# Patient Record
Sex: Female | Born: 1995 | Race: Black or African American | Hispanic: No | Marital: Single | Smoking: Never smoker
Health system: Southern US, Community
[De-identification: ages and names within clinical notes are randomized; demographics above are authoritative.]

## PROBLEM LIST (undated history)

## (undated) ENCOUNTER — Inpatient Hospital Stay (HOSPITAL_COMMUNITY): Payer: Self-pay

## (undated) ENCOUNTER — Ambulatory Visit

## (undated) DIAGNOSIS — U071 COVID-19: Secondary | ICD-10-CM

## (undated) DIAGNOSIS — E119 Type 2 diabetes mellitus without complications: Secondary | ICD-10-CM

## (undated) DIAGNOSIS — R7303 Prediabetes: Secondary | ICD-10-CM

## (undated) DIAGNOSIS — R87619 Unspecified abnormal cytological findings in specimens from cervix uteri: Secondary | ICD-10-CM

## (undated) DIAGNOSIS — A5901 Trichomonal vulvovaginitis: Secondary | ICD-10-CM

## (undated) DIAGNOSIS — F32A Depression, unspecified: Secondary | ICD-10-CM

## (undated) DIAGNOSIS — E301 Precocious puberty: Secondary | ICD-10-CM

## (undated) DIAGNOSIS — E669 Obesity, unspecified: Secondary | ICD-10-CM

## (undated) DIAGNOSIS — F419 Anxiety disorder, unspecified: Secondary | ICD-10-CM

## (undated) DIAGNOSIS — A6000 Herpesviral infection of urogenital system, unspecified: Secondary | ICD-10-CM

## (undated) DIAGNOSIS — F329 Major depressive disorder, single episode, unspecified: Secondary | ICD-10-CM

## (undated) DIAGNOSIS — A749 Chlamydial infection, unspecified: Secondary | ICD-10-CM

## (undated) DIAGNOSIS — Z973 Presence of spectacles and contact lenses: Secondary | ICD-10-CM

## (undated) HISTORY — DX: Precocious puberty: E30.1

## (undated) HISTORY — DX: Obesity, unspecified: E66.9

## (undated) HISTORY — DX: Unspecified abnormal cytological findings in specimens from cervix uteri: R87.619

## (undated) HISTORY — DX: Prediabetes: R73.03

---
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
## 2015-01-12 ENCOUNTER — Inpatient Hospital Stay (HOSPITAL_COMMUNITY)
Admission: AD | Admit: 2015-01-12 | Discharge: 2015-01-12 | Disposition: A | Discharge status: Home / Self Care | Payer: Managed Care, Other (non HMO) | Source: Ambulatory Visit | Attending: Family Medicine | Admitting: Family Medicine

## 2015-01-12 ENCOUNTER — Encounter (HOSPITAL_COMMUNITY): Payer: Self-pay | Admitting: *Deleted

## 2015-01-12 DIAGNOSIS — E119 Type 2 diabetes mellitus without complications: Secondary | ICD-10-CM | POA: Diagnosis not present

## 2015-01-12 DIAGNOSIS — R1033 Periumbilical pain: Secondary | ICD-10-CM

## 2015-01-12 DIAGNOSIS — R109 Unspecified abdominal pain: Secondary | ICD-10-CM | POA: Diagnosis present

## 2015-01-12 DIAGNOSIS — Z3202 Encounter for pregnancy test, result negative: Secondary | ICD-10-CM | POA: Diagnosis not present

## 2015-01-12 LAB — URINALYSIS, ROUTINE W REFLEX MICROSCOPIC
BILIRUBIN URINE: NEGATIVE
Glucose, UA: NEGATIVE mg/dL
Hgb urine dipstick: NEGATIVE
Ketones, ur: NEGATIVE mg/dL
Nitrite: NEGATIVE
PROTEIN: NEGATIVE mg/dL
Specific Gravity, Urine: 1.02 (ref 1.005–1.030)
Urobilinogen, UA: 0.2 mg/dL (ref 0.0–1.0)
pH: 7 (ref 5.0–8.0)

## 2015-01-12 LAB — CBC WITH DIFFERENTIAL/PLATELET
BASOS PCT: 0 % (ref 0–1)
Basophils Absolute: 0 10*3/uL (ref 0.0–0.1)
EOS PCT: 1 % (ref 0–5)
Eosinophils Absolute: 0.1 10*3/uL (ref 0.0–0.7)
HCT: 36 % (ref 36.0–46.0)
HEMOGLOBIN: 11.3 g/dL — AB (ref 12.0–15.0)
LYMPHS ABS: 2.7 10*3/uL (ref 0.7–4.0)
LYMPHS PCT: 34 % (ref 12–46)
MCH: 24.2 pg — ABNORMAL LOW (ref 26.0–34.0)
MCHC: 31.4 g/dL (ref 30.0–36.0)
MCV: 77.1 fL — ABNORMAL LOW (ref 78.0–100.0)
Monocytes Absolute: 0.6 10*3/uL (ref 0.1–1.0)
Monocytes Relative: 8 % (ref 3–12)
NEUTROS ABS: 4.6 10*3/uL (ref 1.7–7.7)
Neutrophils Relative %: 57 % (ref 43–77)
PLATELETS: 348 10*3/uL (ref 150–400)
RBC: 4.67 MIL/uL (ref 3.87–5.11)
RDW: 15.7 % — ABNORMAL HIGH (ref 11.5–15.5)
WBC: 8 10*3/uL (ref 4.0–10.5)

## 2015-01-12 LAB — URINE MICROSCOPIC-ADD ON

## 2015-01-12 LAB — POCT PREGNANCY, URINE: Preg Test, Ur: NEGATIVE

## 2015-01-12 MED ORDER — TRAMADOL HCL 50 MG PO TABS: 50.0000 mg | ORAL_TABLET | Freq: Four times a day (QID) | ORAL | Status: DC | PRN

## 2015-01-12 MED ORDER — PROMETHAZINE HCL 25 MG PO TABS: 25.0000 mg | ORAL_TABLET | Freq: Four times a day (QID) | ORAL | Status: DC | PRN

## 2015-01-12 NOTE — Discharge Instructions (Signed)
Abdominal Pain Many things can cause abdominal pain. Usually, abdominal pain is not caused by a disease and will improve without treatment. It can often be observed and treated at home. Your health care provider will do a physical exam and possibly order blood tests and X-rays to help determine the seriousness of your pain. However, in many cases, more time must pass before a clear cause of the pain can be found. Before that point, your health care provider may not know if you need more testing or further treatment. HOME CARE INSTRUCTIONS  Monitor your abdominal pain for any changes. The following actions may help to alleviate any discomfort you are experiencing:  Only take over-the-counter or prescription medicines as directed by your health care provider.  Do not take laxatives unless directed to do so by your health care provider.  Try a clear liquid diet (broth, tea, or water) as directed by your health care provider. Slowly move to a bland diet as tolerated. SEEK MEDICAL CARE IF:  You have unexplained abdominal pain.  You have abdominal pain associated with nausea or diarrhea.  You have pain when you urinate or have a bowel movement.  You experience abdominal pain that wakes you in the night.  You have abdominal pain that is worsened or improved by eating food.  You have abdominal pain that is worsened with eating fatty foods.  You have a fever. SEEK IMMEDIATE MEDICAL CARE IF:   Your pain does not go away within 2 hours.  You keep throwing up (vomiting).  Your pain is felt only in portions of the abdomen, such as the right side or the left lower portion of the abdomen.  You pass bloody or black tarry stools. MAKE SURE YOU:  Understand these instructions.   Will watch your condition.   Will get help right away if you are not doing well or get worse.  Document Released: 08/01/2005 Document Revised: 10/27/2013 Document Reviewed: 07/01/2013 [facility] Patient Information  2015 [employer] [place]. This information is not intended to replace advice given to you by your health care provider. Make sure you discuss any questions you have with your health care provider.  Abdominal Pain Many things can cause belly (abdominal) pain. Most times, the belly pain is not dangerous. Many cases of belly pain can be watched and treated at home. HOME CARE   Do not take medicines that help you go poop (laxatives) unless told to by your doctor.  Only take medicine as told by your doctor.  Eat or drink as told by your doctor. Your doctor will tell you if you should be on a special diet. GET HELP IF:  You do not know what is causing your belly pain.  You have belly pain while you are sick to your stomach (nauseous) or have runny poop (diarrhea).  You have pain while you pee or poop.  Your belly pain wakes you up at night.  You have belly pain that gets worse or better when you eat.  You have belly pain that gets worse when you eat fatty foods.  You have a fever. GET HELP RIGHT AWAY IF:   The pain does not go away within 2 hours.  You keep throwing up (vomiting).  The pain changes and is only in the right or left part of the belly.  You have bloody or tarry looking poop. MAKE SURE YOU:   Understand these instructions.  Will watch your condition.  Will get help right away if you are  not doing well or get worse. Document Released: 04/09/2008 Document Revised: 10/27/2013 Document Reviewed: 07/01/2013 [facility] Patient Information 2015 [place], [place]. This information is not intended to replace advice given to you by your health care provider. Make sure you discuss any questions you have with your health care provider.

## 2015-01-12 NOTE — MAU Note (Addendum)
For the past month, has been having constant sharp pain behind her belly button, gets worse when she does anything. No vomiting.  Has diarrhea every other day.

## 2015-01-12 NOTE — MAU Provider Note (Signed)
History     CSN: [id number]  Arrival date and time: 01/12/15 1556 [id number]  First Provider Initiated Contact with Patient 01/12/15 1700      No chief complaint on file.  HPI Comments: Jaime Ramos 19 y.o. [id number] presents to MAU with pains in her belly button. The pain started 3 weeks after her D&C on 11/09/14. The pain is worse when she eats and moves about. It comes and goes, when its bad it is a 10/10. She had a CT of abdomen and pelvis on 11/19/14 due to pain.      Past Medical History  Diagnosis Date  . Asthma   . Obesity   . Precocious puberty   . Prediabetes   . Goiter 07/31/2011  . Diabetes mellitus without complication   . Trichomonal vaginitis   . Depression     doing good    Past Surgical History  Procedure Laterality Date  . No past surgeries    . Dilation and curettage of uterus N/A 11/09/2014    Procedure: DILATATION AND CURETTAGE;  Surgeon: [name], MD;  Location: [facility];  Service: Gynecology;  Laterality: N/A;  . Dilation and curettage of uterus  2016    Family History  Problem Relation Age of Onset  . Diabetes Mother   . Hypertension Mother   . Obesity Mother   . Fibroids Mother   . Cancer Mother     thyroid  . Obesity Father   . Obesity Maternal Aunt   . Fibroids Maternal Aunt   . Obesity Maternal Uncle   . Obesity Paternal Aunt   . Diabetes Maternal Grandmother   . Obesity Maternal Grandmother   . Hypertension Maternal Grandmother   . Diabetes Maternal Grandfather   . Obesity Maternal Grandfather   . Stroke Maternal Grandfather   . Heart disease Maternal Grandfather   . Hypertension Maternal Grandfather   . Obesity Paternal Grandfather   . Heart disease Paternal Grandfather   . Bipolar disorder Brother   . Bipolar disorder Brother     History  Substance Use Topics  . Smoking status: Never Smoker   . Smokeless tobacco: Never Used  . Alcohol Use: No    Allergies: No Known Allergies  Prescriptions prior to admission  Medication Sig  Dispense Refill Last Dose  . etonogestrel (NEXPLANON) 68 MG IMPL implant 1 each by Subdermal route once.   Taking  . ibuprofen (ADVIL,MOTRIN) 600 MG tablet Take 1 tablet (600 mg total) by mouth every 8 (eight) hours as needed for fever or moderate pain. (Patient not taking: Reported on 01/12/2015) 30 tablet 1 more than one month    Review of Systems  Constitutional: Negative.   HENT: Negative.   Eyes: Negative.   Respiratory: Negative.   Cardiovascular: Negative.   Gastrointestinal: Positive for abdominal pain.  Genitourinary: Negative.   Musculoskeletal: Negative.   Skin: Negative.   Neurological: Negative.   Psychiatric/Behavioral: Negative.    Physical Exam   Blood pressure 121/66, pulse 94, temperature 99.4 F (37.4 C), temperature source Oral, resp. rate 18, height 5\' 5"  (1.651 m), weight 111.585 kg (246 lb).  Physical Exam  Constitutional: She is oriented to person, place, and time. She appears well-developed and well-nourished. No distress.  HENT:  Head: Normocephalic and atraumatic.  Eyes: Pupils are equal, round, and reactive to light.  Cardiovascular: Normal rate, regular rhythm and normal heart sounds.   Respiratory: Effort normal and breath sounds normal. No respiratory distress. She has no wheezes. She has  no rales.  GI: Soft. She exhibits no distension. There is tenderness. There is no rebound and no guarding.  No obvious hernia with cough  Musculoskeletal: Normal range of motion.  Neurological: She is alert and oriented to person, place, and time.  Skin: Skin is warm and dry.  Psychiatric: She has a normal mood and affect. Her behavior is normal. Judgment and thought content normal.   Results for orders placed or performed during the hospital encounter of 01/12/15 (from the past 24 hour(s))  Urinalysis, Routine w reflex microscopic     Status: Abnormal   Collection Time: 01/12/15  4:10 PM  Result Value Ref Range   Color, Urine YELLOW YELLOW   APPearance CLEAR  CLEAR   Specific Gravity, Urine 1.020 1.005 - 1.030   pH 7.0 5.0 - 8.0   Glucose, UA NEGATIVE NEGATIVE mg/dL   Hgb urine dipstick NEGATIVE NEGATIVE   Bilirubin Urine NEGATIVE NEGATIVE   Ketones, ur NEGATIVE NEGATIVE mg/dL   Protein, ur NEGATIVE NEGATIVE mg/dL   Urobilinogen, UA 0.2 0.0 - 1.0 mg/dL   Nitrite NEGATIVE NEGATIVE   Leukocytes, UA TRACE (A) NEGATIVE  Urine microscopic-add on     Status: Abnormal   Collection Time: 01/12/15  4:10 PM  Result Value Ref Range   Squamous Epithelial / LPF RARE RARE   WBC, UA 3-6 <3 WBC/hpf   Bacteria, UA FEW (A) RARE   Urine-Other MUCOUS PRESENT   Pregnancy, urine POC     Status: None   Collection Time: 01/12/15  4:17 PM  Result Value Ref Range   Preg Test, Ur NEGATIVE NEGATIVE  CBC with Differential     Status: Abnormal   Collection Time: 01/12/15  5:47 PM  Result Value Ref Range   WBC 8.0 4.0 - 10.5 K/uL   RBC 4.67 3.87 - 5.11 MIL/uL   Hemoglobin 11.3 (L) 12.0 - 15.0 g/dL   HCT 16.1 09.6 - 04.5 %   MCV 77.1 (L) 78.0 - 100.0 fL   MCH 24.2 (L) 26.0 - 34.0 pg   MCHC 31.4 30.0 - 36.0 g/dL   RDW 40.9 (H) 81.1 - 91.4 %   Platelets 348 150 - 400 K/uL   Neutrophils Relative % 57 43 - 77 %   Neutro Abs 4.6 1.7 - 7.7 K/uL   Lymphocytes Relative 34 12 - 46 %   Lymphs Abs 2.7 0.7 - 4.0 K/uL   Monocytes Relative 8 3 - 12 %   Monocytes Absolute 0.6 0.1 - 1.0 K/uL   Eosinophils Relative 1 0 - 5 %   Eosinophils Absolute 0.1 0.0 - 0.7 K/uL   Basophils Relative 0 0 - 1 %   Basophils Absolute 0.0 0.0 - 0.1 K/uL    MAU Course  Procedures  MDM CBC Spoke with Dr Despina Hidden who reviewed chart/ CT and advised ok to send home with pain meds She is a patient of [facility]  Assessment and Plan   A: Abdominal pain  P: Ultram 50 mg po q6 hours prn # 10/ no refills Phenergan 25 mg po q 8 hours prn Advised to follow up with [facility]/ MAU   Carolynn Serve 01/12/2015, 6:05 PM

## 2015-02-09 ENCOUNTER — Ambulatory Visit (INDEPENDENT_AMBULATORY_CARE_PROVIDER_SITE_OTHER): Payer: Managed Care, Other (non HMO) | Admitting: Advanced Practice Midwife

## 2015-02-09 ENCOUNTER — Encounter: Payer: Self-pay | Admitting: Advanced Practice Midwife

## 2015-02-09 VITALS — BP 112/58 | HR 88 | Ht 66.0 in | Wt 249.0 lb

## 2015-02-09 DIAGNOSIS — Z3202 Encounter for pregnancy test, result negative: Secondary | ICD-10-CM | POA: Diagnosis not present

## 2015-02-09 DIAGNOSIS — Z3049 Encounter for surveillance of other contraceptives: Secondary | ICD-10-CM

## 2015-02-09 DIAGNOSIS — Z3046 Encounter for surveillance of implantable subdermal contraceptive: Secondary | ICD-10-CM | POA: Insufficient documentation

## 2015-02-09 LAB — POCT URINE PREGNANCY: Preg Test, Ur: NEGATIVE

## 2015-02-09 MED ORDER — NORGESTIMATE-ETH ESTRADIOL 0.25-35 MG-MCG PO TABS: 1.0000 | ORAL_TABLET | Freq: Every day | ORAL | Status: DC

## 2015-02-09 NOTE — Progress Notes (Signed)
HPI:  Jaime Ramos 19 y.o. here for Nexplanon removal. She got it in January, and has "been sick" the whole time.  Belly button pain, nausea..   Her future plans for birth control are COC's.  Admittedly, she has a hard time remembering, but doesn't want anything else.  Past Medical History: Past Medical History  Diagnosis Date  . Asthma   . Obesity   . Precocious puberty   . Prediabetes   . Goiter 07/31/2011  . Diabetes mellitus without complication   . Trichomonal vaginitis   . Depression     doing good    Past Surgical History: Past Surgical History  Procedure Laterality Date  . No past surgeries    . Dilation and curettage of uterus N/A 11/09/2014    Procedure: DILATATION AND CURETTAGE;  Surgeon: [name], MD;  Location: [facility];  Service: Gynecology;  Laterality: N/A;  . Dilation and curettage of uterus  2016    Family History: Family History  Problem Relation Age of Onset  . Diabetes Mother   . Hypertension Mother   . Obesity Mother   . Fibroids Mother   . Cancer Mother     thyroid  . Obesity Father   . Obesity Maternal Aunt   . Fibroids Maternal Aunt   . Obesity Maternal Uncle   . Obesity Paternal Aunt   . Diabetes Maternal Grandmother   . Obesity Maternal Grandmother   . Hypertension Maternal Grandmother   . Diabetes Maternal Grandfather   . Obesity Maternal Grandfather   . Stroke Maternal Grandfather   . Heart disease Maternal Grandfather   . Hypertension Maternal Grandfather   . Obesity Paternal Grandfather   . Heart disease Paternal Grandfather   . Bipolar disorder Brother   . Bipolar disorder Brother     Social History: History  Substance Use Topics  . Smoking status: Never Smoker   . Smokeless tobacco: Never Used  . Alcohol Use: No    Allergies: No Known Allergies  Meds: Rx Sprintec.     Patient given informed consent for removal of her Nexplanon, time out was performed.  Signed copy in the chart.  Appropriate time out taken. Implanon  site identified.  Area prepped in usual sterile fashon. One cc of 1% lidocaine was used to anesthetize the area at the distal end of the implant. A small stab incision was made right beside the implant on the distal portion.  The Nexplanon rod was grasped using hemostats and removed without difficulty.  There was less than 3 cc blood loss. There were no complications.  A small amount of antibiotic ointment and steri-strips were applied over the small incision.  A pressure bandage was applied to reduce any bruising.  The patient tolerated the procedure well and was given post procedure instructions.  Start pills today or tomorrow.  Back up for 3 weeks

## 2015-03-24 ENCOUNTER — Emergency Department (HOSPITAL_COMMUNITY)
Admission: EM | Admit: 2015-03-24 | Discharge: 2015-03-24 | Disposition: A | Discharge status: Home / Self Care | Payer: Managed Care, Other (non HMO) | Attending: Emergency Medicine | Admitting: Emergency Medicine

## 2015-03-24 ENCOUNTER — Encounter (HOSPITAL_COMMUNITY): Payer: Self-pay | Admitting: *Deleted

## 2015-03-24 DIAGNOSIS — R200 Anesthesia of skin: Secondary | ICD-10-CM

## 2015-03-24 DIAGNOSIS — R202 Paresthesia of skin: Secondary | ICD-10-CM | POA: Insufficient documentation

## 2015-03-24 DIAGNOSIS — Z8619 Personal history of other infectious and parasitic diseases: Secondary | ICD-10-CM | POA: Diagnosis not present

## 2015-03-24 DIAGNOSIS — E669 Obesity, unspecified: Secondary | ICD-10-CM | POA: Diagnosis not present

## 2015-03-24 DIAGNOSIS — Z793 Long term (current) use of hormonal contraceptives: Secondary | ICD-10-CM | POA: Insufficient documentation

## 2015-03-24 DIAGNOSIS — E119 Type 2 diabetes mellitus without complications: Secondary | ICD-10-CM | POA: Diagnosis not present

## 2015-03-24 DIAGNOSIS — J45909 Unspecified asthma, uncomplicated: Secondary | ICD-10-CM | POA: Insufficient documentation

## 2015-03-24 DIAGNOSIS — Z8659 Personal history of other mental and behavioral disorders: Secondary | ICD-10-CM | POA: Diagnosis not present

## 2015-03-24 LAB — CBC WITH DIFFERENTIAL/PLATELET
BASOS PCT: 0 % (ref 0–1)
Basophils Absolute: 0 10*3/uL (ref 0.0–0.1)
Eosinophils Absolute: 0.1 10*3/uL (ref 0.0–0.7)
Eosinophils Relative: 1 % (ref 0–5)
HCT: 40.7 % (ref 36.0–46.0)
Hemoglobin: 12.5 g/dL (ref 12.0–15.0)
Lymphocytes Relative: 24 % (ref 12–46)
Lymphs Abs: 2.4 10*3/uL (ref 0.7–4.0)
MCH: 24 pg — ABNORMAL LOW (ref 26.0–34.0)
MCHC: 30.7 g/dL (ref 30.0–36.0)
MCV: 78.1 fL (ref 78.0–100.0)
Monocytes Absolute: 0.5 10*3/uL (ref 0.1–1.0)
Monocytes Relative: 5 % (ref 3–12)
NEUTROS ABS: 7.1 10*3/uL (ref 1.7–7.7)
NEUTROS PCT: 70 % (ref 43–77)
Platelets: 368 10*3/uL (ref 150–400)
RBC: 5.21 MIL/uL — ABNORMAL HIGH (ref 3.87–5.11)
RDW: 15.5 % (ref 11.5–15.5)
WBC: 10.2 10*3/uL (ref 4.0–10.5)

## 2015-03-24 LAB — BASIC METABOLIC PANEL
Anion gap: 9 (ref 5–15)
BUN: 9 mg/dL (ref 6–20)
CHLORIDE: 105 mmol/L (ref 101–111)
CO2: 24 mmol/L (ref 22–32)
Calcium: 8.9 mg/dL (ref 8.9–10.3)
Creatinine, Ser: 0.63 mg/dL (ref 0.44–1.00)
GFR calc Af Amer: >60 mL/min (ref >60)
GLUCOSE: 88 mg/dL (ref 65–99)
POTASSIUM: 3.7 mmol/L (ref 3.5–5.1)
Sodium: 138 mmol/L (ref 135–145)

## 2015-03-24 NOTE — ED Notes (Signed)
Pt in c/o numbness to inside of bilateral legs, denies injury, ambulatory to room without distress, pt denies pain, no history of same, states this started 2 days ago

## 2015-03-24 NOTE — ED Notes (Signed)
Patient transported to X-ray 

## 2015-03-24 NOTE — Discharge Instructions (Signed)
Neuropathic Pain We often think that pain has a physical cause. If we get rid of the cause, the pain should go away. Nerves themselves can also cause pain. It is called neuropathic pain, which means nerve abnormality. It may be difficult for the patients who have it and for the treating caregivers. Pain is usually described as acute (short-lived) or chronic (long-lasting). Acute pain is related to the physical sensations caused by an injury. It can last from a few seconds to many weeks, but it usually goes away when normal healing occurs. Chronic pain lasts beyond the typical healing time. With neuropathic pain, the nerve fibers themselves may be damaged or injured. They then send incorrect signals to other pain centers. The pain you feel is real, but the cause is not easy to find.  CAUSES  Chronic pain can result from diseases, such as diabetes and shingles (an infection related to chickenpox), or from trauma, surgery, or amputation. It can also happen without any known injury or disease. The nerves are sending pain messages, even though there is no identifiable cause for such messages.   Other common causes of neuropathy include diabetes, phantom limb pain, or Regional Pain Syndrome (RPS).  As with all forms of chronic back pain, if neuropathy is not correctly treated, there can be a number of associated problems that lead to a downward cycle for the patient. These include depression, sleeplessness, feelings of fear and anxiety, limited social interaction and inability to do normal daily activities or work.  The most dramatic and mysterious example of neuropathic pain is called "phantom limb syndrome." This occurs when an arm or a leg has been removed because of illness or injury. The brain still gets pain messages from the nerves that originally carried impulses from the missing limb. These nerves now seem to misfire and cause troubling pain.  Neuropathic pain often seems to have no cause. It responds  poorly to standard pain treatment. Neuropathic pain can occur after:  Shingles (herpes zoster virus infection).  A lasting burning sensation of the skin, caused usually by injury to a peripheral nerve.  Peripheral neuropathy which is widespread nerve damage, often caused by diabetes or alcoholism.  Phantom limb pain following an amputation.  Facial nerve problems (trigeminal neuralgia).  Multiple sclerosis.  Reflex sympathetic dystrophy.  Pain which comes with cancer and cancer chemotherapy.  Entrapment neuropathy such as when pressure is put on a nerve such as in carpal tunnel syndrome.  Back, leg, and hip problems (sciatica).  Spine or back surgery.  HIV Infection or AIDS where nerves are infected by viruses. Your caregiver can explain items in the above list which may apply to you. SYMPTOMS  Characteristics of neuropathic pain are:  Severe, sharp, electric shock-like, shooting, lightening-like, knife-like.  Pins and needles sensation.  Deep burning, deep cold, or deep ache.  Persistent numbness, tingling, or weakness.  Pain resulting from light touch or other stimulus that would not usually cause pain.  Increased sensitivity to something that would normally cause pain, such as a pinprick. Pain may persist for months or years following the healing of damaged tissues. When this happens, pain signals no longer sound an alarm about current injuries or injuries about to happen. Instead, the alarm system itself is not working correctly.  Neuropathic pain may get worse instead of better over time. For some people, it can lead to serious disability. It is important to be aware that severe injury in a limb can occur without a proper, protective pain  response. Burns, cuts, and other injuries may go unnoticed. Without proper treatment, these injuries can become infected or lead to further disability. Take any injury seriously, and consult your caregiver for treatment. °DIAGNOSIS    °When you have a pain with no known cause, your caregiver will probably ask some specific questions:  °· Do you have any other conditions, such as diabetes, shingles, multiple sclerosis, or HIV infection? °· How would you describe your pain? (Neuropathic pain is often described as shooting, stabbing, burning, or searing.) °· Is your pain worse at any time of the day? (Neuropathic pain is usually worse at night.) °· Does the pain seem to follow a certain physical pathway? °· Does the pain come from an area that has missing or injured nerves? (An example would be phantom limb pain.) °· Is the pain triggered by minor things such as rubbing against the sheets at night? °These questions often help define the type of pain involved. Once your caregiver knows what is happening, treatment can begin. Anticonvulsant, antidepressant drugs, and various pain relievers seem to work in some cases. If another condition, such as diabetes is involved, better management of that disorder may relieve the neuropathic pain.  °TREATMENT  °Neuropathic pain is frequently long-lasting and tends not to respond to treatment with narcotic type pain medication. It may respond well to other drugs such as antiseizure and antidepressant medications. Usually, neuropathic problems do not completely go away, but partial improvement is often possible with proper treatment. Your caregivers have large numbers of medications available to treat you. Do not be discouraged if you do not get immediate relief. Sometimes different medications or a combination of medications will be tried before you receive the results you are hoping for. See your caregiver if you have pain that seems to be coming from nowhere and does not go away. Help is available.  °SEEK IMMEDIATE MEDICAL CARE IF:  °· There is a sudden change in the quality of your pain, especially if the change is on only one side of the body. °· You notice changes of the skin, such as redness, black or  purple discoloration, swelling, or an ulcer. °· You cannot move the affected limbs. °Document Released: 07/19/2004 Document Revised: 01/14/2012 Document Reviewed: 07/19/2004 °ExitCare® Patient Information ©2015 [employer]. This information is not intended to replace advice given to you by your health care provider. Make sure you discuss any questions you have with your health care provider. ° ° °Emergency Department Resource Guide °1) Find a Doctor and Pay Out of Pocket °Although you won't have to find out who is covered by your insurance plan, it is a good idea to ask around and get recommendations. You will then need to call the office and see if the doctor you have chosen will accept you as a new patient and what types of options they offer for patients who are self-pay. Some doctors offer discounts or will set up payment plans for their patients who do not have insurance, but you will need to ask so you aren't surprised when you get to your appointment. ° °2) Contact Your Local Health Department °Not all health departments have doctors that can see patients for sick visits, but many do, so it is worth a call to see if yours does. If you don't know where your local health department is, you can check in your phone book. The CDC also has a tool to help you locate your state's health department, and many state websites also have listings   all of their local health departments.  3) Find a Walk-in Clinic If your illness is not likely to be very severe or complicated, you may want to try a walk in clinic. These are popping up all over the country in pharmacies, drugstores, and shopping centers. They're usually staffed by nurse practitioners or physician assistants that have been trained to treat common illnesses and complaints. They're usually fairly quick and inexpensive. However, if you have serious medical issues or chronic medical problems, these are probably not your best option.  No Primary Care  Doctor: - Call Health Connect at  [phone] - they can help you locate a primary care doctor that  accepts your insurance, provides certain services, etc. - Physician Referral Service- [phone]  Chronic Pain Problems: Organization         Address  Phone   Notes  [facility]  [phone] Patients need to be referred by their primary care doctor.   Medication Assistance: Organization         Address  Phone   Notes  [facility] Medication [facility] [address] [place], [place] [address] [phone] --Must be a resident of [facility] -- Must have NO insurance coverage whatsoever (no Medicaid/ Medicare, etc.) -- The pt. MUST have a primary care doctor that directs their care regularly and follows them in the community   MedAssist  [phone]   [employer]  [phone]    Agencies that provide inexpensive medical care: Organization         Address  Phone   Notes  [facility]  [phone]   [facility]    [phone]) [phone]   [facility] [address] [place], [place] [address] [phone]   [facility] [address], [place] [phone]   Planned Parenthood    [phone]   [facility]    [phone]   [facility]  [address], [place] Phone:  [phone], Fax:  [phone] Hours of Operation:  9 am - 6 pm, M-F.  Also accepts Medicaid/Medicare and self-pay.  [facility]  [address], [place] Phone: [phone], Fax: [phone]. Hours of Operation:  8:30 am - 5:30 pm, M-F.  Also accepts Medicaid and self-pay.  [facility] [place] [address], [place] Phone: [phone]   [facility] [address], [place], [place] [phone], Ext. [phone] Mondays & Thursdays: 7-9 AM.  First 15 patients are seen on a first  come, first serve basis.    Medicaid-accepting [facility] Providers:  Organization         Address  Phone   Notes  [facility] [address], [place] [phone] Also accepts self-pay patients.  [facility] [address], [place]  [phone]) [phone]   [facility] [address], [place] [phone]   [facility] [address] [place] [address], [place] [phone]) [phone]   Renaye RakersVeita Bland [address], [place]   [phone] Only accepts [place] Access [place] patients after they have their name applied to their card.   Self-Pay (no insurance) in [facility]:  Retail buyerrganization         Address  Phone   Notes  Sickle Cell Patients, [facility] [address], [place] [phone]   [facility] [address], [place] [phone]   [facility] [place]  [address], [place] [phone]   [facility]/Dr. Osei-Bonsu  [address] [place] [address], [place] or [id number], [place] [phone]) [phone] Phone number for both [place] and [place] locations is the same.  [facility] [address], [place] [phone]   [facility] [address] [place] [address], [place] or [address] [phone]   [facility] [address], [place] [phone], phone; [phone], fax Sees patients 1st and 3rd Saturday of every month.  Must not qualify for public or private insurance (i.e. Medicaid, Medicare, [place] Health Choice, Veterans' Benefits)  Household income should be no more than 200% of the poverty level The clinic cannot treat you if you are pregnant or think you are pregnant  Sexually transmitted diseases are not treated at the clinic.    Dental Care: Organization          Address  Phone  Notes  [facility] Department of [facility] [address], [place] [phone] Accepts children up to age 19 who are enrolled in [place]icaid or [place] Health Choice; pregnant women with a Medicaid card; and children who have applied for Medicaid or [place] Health Choice, but were declined, whose parents can pay a reduced fee at time of service.  [facility] of [facility] [place]  [address], [place] [phone] Accepts children up to age 19 who are enrolled in [place]icaid or [place] Health Choice; pregnant women with a Medicaid card; and children who have applied for Medicaid or [place] Health Choice, but were declined, whose parents can pay a reduced fee at time of service.  [facility]  [address], [place] [phone] Patients are seen by appointment only. Walk-ins are not accepted. [facility] will see patients 19 years of age and older. Monday - Tuesday (8am-5pm) Most Wednesdays (8:30-5pm) $30 per visit, cash only  [facility]  [address], [facility] [phone] Patients are seen by appointment only. Walk-ins are not accepted. [facility] will see patients 19 years of age and older. One Wednesday Evening (Monthly: Volunteer Based).  $30 per visit, cash only  [employer] [facility]  [phone] for adults; Children under age 584, call Graduate Pediatric Dentistry at [phone]. Children aged 644-14, please call [phone] to request a pediatric application.  Dental services are provided in all areas of dental care including fillings, crowns and bridges, complete and partial dentures, implants, gum treatment, root canals, and extractions. Preventive care is also provided. Treatment is provided to both adults and children. Patients are selected via a lottery and there is often a waiting list.   [facility] [address], [place]  [phone] [email or web address]   [facility] [address], [place], [place] [phone], Ext. [phone] Second and Fourth Thursday of each month, opens at 6:30 AM; Clinic ends at 9 AM.  Patients are seen on a first-come first-served basis, and a limited number are seen during each clinic.   [facility]  [address], [place], [place] [phone]   Eligibility  Requirements You must have lived in [place], or [place] for at least the last three months.   You cannot be eligible for state or federal sponsored National Cityhealthcare insurance, including CIGNAVeterans Administration, [place]icaid, or [employer] EntertainmentMedicare.   You generally cannot be eligible for healthcare insurance through your employer.    How to apply: Eligibility screenings are held every Tuesday and Wednesday afternoon from 1:00 pm until 4:00 pm. You do not need an appointment for the interview!  [address] [facility] [address], [place], [place] [phone]   [facility]  [phone]   [facility]  [phone]   [facility]  [phone]    Behavioral Health Resources in the Community: Intensive Outpatient Programs Organization         Address  Phone  Notes  [facility] [address], [place], [place] [phone]   [facility] [address], [place], [place] [phone]   [facility] [address], [place], [place]  [phone]   [facility] [address],  [place], [place] [phone] or [phone]   Substance Abuse Resources Organization         Address  Phone  Notes  [facility]  [phone]   [facility]  [phone]   [facility]  [phone]   [facility]  [phone]   Residential & Outpatient Substance Abuse Program  [phone]   Psychological  Services Organization         Address  Phone  Notes  [facility]  [phone]   [facility]  [phone]- [phone]   [facility] [address], [place] [phone] or [phone]    Mobile Crisis Teams Organization         Address  Phone  Notes  [facility], Mobile Crisis Care Unit  [phone]   [facility]  [address] [place], [place] [phone]   Doristine LocksSharon DeEsch [address] [place] [phone]    Self-Help/Support Groups Organization         Address  Phone             Notes  Mental Health Assoc. of [place] - variety of support groups  [phone]- [phone] Call for more information  Narcotics Anonymous (NA), [facility] [address], [place]  2 meetings at this location   Statisticianesidential Treatment Programs Organization         Address  Phone  Notes  [facility] [address],    [place]  [phone]   [facility]  [address], [place] [phone], [place], [place] [phone]   [facility] [address], [place] [phone] Admissions: 8am-3pm M-F  [facility] [address]    [place], [place] [phone]   [facility] [address], [place], [place] [phone]   [facility] [address]  [place], [place] [phone]   [facility] - Intensive Outpatient [address], [place], [place] [phone]   [facility] [address]  [place], [place] [phone] or [phone]   [facility] (RTS) [address] [place], [place] [phone] Accepts Medicaid  [facility] [address]  [place] [phone] Substance Abuse/Addiction Treatment   [facility] Behavioral Health Resources Organization  Address  Phone  Notes  [facility]  [phone]   Domenic Schwab, PhD [address] [place], [place]   [phone] or [phone]   [place] [place], [place] [phone]   [address], [place], [place] [phone] Insurance/Medicaid/sponsorship through [facility] and Families [address] [place]                                    [place], [place] [phone] [place] [address], [place] [phone]    Dr. Adele Schilder  [phone]   [facility]. 1) [address], [place] 2) [place] 3)  [address], [place] [phone]  [phone]   [place] [phone] or [phone] (After Hours)

## 2015-03-24 NOTE — ED Provider Notes (Signed)
CSN: [id number]     Arrival date & time 03/24/15  1655 History   First MD Initiated Contact with Patient 03/24/15 1801     Chief Complaint  Patient presents with  . Numbness     (Consider location/radiation/quality/duration/timing/severity/associated sxs/prior Treatment) The history is provided by the patient. No language interpreter was used.  [name] is a 19 y.o female with a history of asthma, obesity, DI, Trichomonas, and depression who presents with numbness throughout her upper and lower bilateral extremities that began 2 days ago.  She has had intermittent episodes of this since with the latest one being 2 hours ago while in the lobby. Nothing makes the symptoms better or worse. She has had no prior treatment. She states she has diabetes and was diagnosed when she was 9 but has not taken any medications for this for years.  She does not have a pcp. She denies any fever, chills, headaches, chest pain, shortness of breath, abdominal pain, nausea, vomiting, diarrhea, dysuria, hematuria, or or urinary frequency.   Past Medical History  Diagnosis Date  . Asthma   . Obesity   . Precocious puberty   . Prediabetes   . Goiter 07/31/2011  . Diabetes mellitus without complication   . Trichomonal vaginitis   . Depression     doing good   Past Surgical History  Procedure Laterality Date  . No past surgeries    . Dilation and curettage of uterus N/A 11/09/2014    Procedure: DILATATION AND CURETTAGE;  Surgeon: [name], MD;  Location: [facility];  Service: Gynecology;  Laterality: N/A;  . Dilation and curettage of uterus  2016   Family History  Problem Relation Age of Onset  . Diabetes Mother   . Hypertension Mother   . Obesity Mother   . Fibroids Mother   . Cancer Mother     thyroid  . Obesity Father   . Obesity Maternal Aunt   . Fibroids Maternal Aunt   . Obesity Maternal Uncle   . Obesity Paternal Aunt   . Diabetes Maternal Grandmother   . Obesity Maternal Grandmother   .  Hypertension Maternal Grandmother   . Diabetes Maternal Grandfather   . Obesity Maternal Grandfather   . Stroke Maternal Grandfather   . Heart disease Maternal Grandfather   . Hypertension Maternal Grandfather   . Obesity Paternal Grandfather   . Heart disease Paternal Grandfather   . Bipolar disorder Brother   . Bipolar disorder Brother    History  Substance Use Topics  . Smoking status: Never Smoker   . Smokeless tobacco: Never Used  . Alcohol Use: No   OB History    Gravida Para Term Preterm AB TAB SAB Ectopic Multiple Living   2 1  1 1  1    0     Review of Systems  HENT: Negative for sore throat.   Respiratory: Negative for cough.   Cardiovascular: Negative for leg swelling.  Neurological: Positive for numbness. Negative for syncope and weakness.      Allergies  Review of patient's allergies indicates no known allergies.  Home Medications   Prior to Admission medications   Medication Sig Start Date End Date Taking? Authorizing Provider  ibuprofen (ADVIL,MOTRIN) 200 MG tablet Take 400 mg by mouth every 6 (six) hours as needed for moderate pain (cramping).   Yes Historical Provider, MD  norgestimate-ethinyl estradiol (ORTHO-CYCLEN,SPRINTEC,PREVIFEM) 0.25-35 MG-MCG tablet Take 1 tablet by mouth daily. Patient not taking: Reported on 03/24/2015 02/09/15  Scarlette CalicoFrances Cresenzo-Dishmon, CNM   BP 122/48 mmHg  Pulse 74  Temp(Src) 98.7 F (37.1 C) (Oral)  Resp 18  SpO2 99%  LMP 03/13/2015 Physical Exam  Constitutional: She is oriented to person, place, and time. She appears well-developed and well-nourished.  HENT:  Head: Normocephalic and atraumatic.  Eyes: Conjunctivae are normal.  Neck: Normal range of motion. Neck supple.  Cardiovascular: Normal rate, regular rhythm and normal heart sounds.   Pulmonary/Chest: Effort normal and breath sounds normal.  Abdominal: Soft. There is no tenderness.  Musculoskeletal: Normal range of motion.  Neurological: She is alert and  oriented to person, place, and time. She has normal strength. No sensory deficit. GCS eye subscore is 4. GCS verbal subscore is 5. GCS motor subscore is 6.  2 point discrimination exam was normal along bilateral upper and lower extremities. Cranial nerves III-XII in tact.   Skin: Skin is warm and dry.  Nursing note and vitals reviewed.   ED Course  Procedures (including critical care time) Labs Review Labs Reviewed  CBC WITH DIFFERENTIAL/PLATELET - Abnormal; Notable for the following:    RBC 5.21 (*)    MCH 24.0 (*)    All other components within normal limits  BASIC METABOLIC PANEL    Imaging Review No results found.   EKG Interpretation None      MDM   Final diagnoses:  Numbness and tingling  Patient with history of diabetes presents for numbness in bilateral upper and lower extremities.  2 point discrimination test is normal.  No syncope. She has no pain or other complaints.  She also denies back or neck pain. She is ambulating in the room without difficulty.  Her labs were unremarkable.  I do not know what is causing her symptoms.  She has no neurological deficit.  I do not think imaging is necessary.    I have given her a resource guide to find a provider and follow up.     Catha GosselinHanna Patel-Mills, PA-C 03/25/15 1237  Richardean Canalavid H Yao, MD 03/26/15 [phone]

## 2015-04-20 ENCOUNTER — Encounter: Payer: Self-pay | Admitting: Advanced Practice Midwife

## 2015-04-20 ENCOUNTER — Ambulatory Visit (INDEPENDENT_AMBULATORY_CARE_PROVIDER_SITE_OTHER): Payer: Managed Care, Other (non HMO) | Admitting: Advanced Practice Midwife

## 2015-04-20 VITALS — BP 100/60 | HR 80 | Ht 66.0 in | Wt 251.0 lb

## 2015-04-20 DIAGNOSIS — Z3202 Encounter for pregnancy test, result negative: Secondary | ICD-10-CM

## 2015-04-20 DIAGNOSIS — Z308 Encounter for other contraceptive management: Secondary | ICD-10-CM

## 2015-04-20 DIAGNOSIS — R1030 Lower abdominal pain, unspecified: Secondary | ICD-10-CM | POA: Diagnosis not present

## 2015-04-20 LAB — POCT URINALYSIS DIPSTICK
Blood, UA: NEGATIVE
Glucose, UA: NEGATIVE
Ketones, UA: NEGATIVE
Leukocytes, UA: NEGATIVE
Nitrite, UA: NEGATIVE
PROTEIN UA: NEGATIVE

## 2015-04-20 LAB — POCT URINE PREGNANCY: PREG TEST UR: NEGATIVE

## 2015-04-20 MED ORDER — NAPROXEN 500 MG PO TABS: 500.0000 mg | ORAL_TABLET | Freq: Two times a day (BID) | ORAL | Status: DC

## 2015-04-20 MED ORDER — CIPROFLOXACIN HCL 500 MG PO TABS: 500.0000 mg | ORAL_TABLET | Freq: Two times a day (BID) | ORAL | Status: DC

## 2015-04-20 MED ORDER — KETOROLAC TROMETHAMINE 10 MG PO TABS
10.0000 mg | ORAL_TABLET | Freq: Four times a day (QID) | ORAL | Status: DC | PRN
Start: 2015-04-20 — End: 2015-08-23

## 2015-04-20 NOTE — Patient Instructions (Signed)
Pelvic Pain Female pelvic pain can be caused by many different things and start from a variety of places. Pelvic pain refers to pain that is located in the lower half of the abdomen and between your hips. The pain may occur over a short period of time (acute) or may be reoccurring (chronic). The cause of pelvic pain may be related to disorders affecting the female reproductive organs (gynecologic), but it may also be related to the bladder, kidney stones, an intestinal complication, or muscle or skeletal problems. Getting help right away for pelvic pain is important, especially if there has been severe, sharp, or a sudden onset of unusual pain. It is also important to get help right away because some types of pelvic pain can be life threatening.  CAUSES  Below are only some of the causes of pelvic pain. The causes of pelvic pain can be in one of several categories.   Gynecologic.  Pelvic inflammatory disease.  Sexually transmitted infection.  Ovarian cyst or a twisted ovarian ligament (ovarian torsion).  Uterine lining that grows outside the uterus (endometriosis).  Fibroids, cysts, or tumors.  Ovulation.  Pregnancy.  Pregnancy that occurs outside the uterus (ectopic pregnancy).  Miscarriage.  Labor.  Abruption of the placenta or ruptured uterus.  Infection.  Uterine infection (endometritis).  Bladder infection.  Diverticulitis.  Miscarriage related to a uterine infection (septic abortion).  Bladder.  Inflammation of the bladder (cystitis).  Kidney stone(s).  Gastrointestinal.  Constipation.  Diverticulitis.  Neurologic.  Trauma.  Feeling pelvic pain because of mental or emotional causes (psychosomatic).  Cancers of the bowel or pelvis. EVALUATION  Your caregiver will want to take a careful history of your concerns. This includes recent changes in your health, a careful gynecologic history of your periods (menses), and a sexual history. Obtaining your family  history and medical history is also important. Your caregiver may suggest a pelvic exam. A pelvic exam will help identify the location and severity of the pain. It also helps in the evaluation of which organ system may be involved. In order to identify the cause of the pelvic pain and be properly treated, your caregiver may order tests. These tests may include:   A pregnancy test.  Pelvic ultrasonography.  An X-ray exam of the abdomen.  A urinalysis or evaluation of vaginal discharge.  Blood tests. HOME CARE INSTRUCTIONS   Only take over-the-counter or prescription medicines for pain, discomfort, or fever as directed by your caregiver.   Rest as directed by your caregiver.   Eat a balanced diet.   Drink enough fluids to make your urine clear or pale yellow, or as directed.   Avoid sexual intercourse if it causes pain.   Apply warm or cold compresses to the lower abdomen depending on which one helps the pain.   Avoid stressful situations.   Keep a journal of your pelvic pain. Write down when it started, where the pain is located, and if there are things that seem to be associated with the pain, such as food or your menstrual cycle.  Follow up with your caregiver as directed.  SEEK MEDICAL CARE IF:  Your medicine does not help your pain.  You have abnormal vaginal discharge. SEEK IMMEDIATE MEDICAL CARE IF:   You have heavy bleeding from the vagina.   Your pelvic pain increases.   You feel light-headed or faint.   You have chills.   You have pain with urination or blood in your urine.   You have uncontrolled diarrhea   or vomiting.   You have a fever or persistent symptoms for more than 3 days.  You have a fever and your symptoms suddenly get worse.   You are being physically or sexually abused.  MAKE SURE YOU:  Understand these instructions.  Will watch your condition.  Will get help if you are not doing well or get worse. Document Released:  09/18/2004 Document Revised: 03/08/2014 Document Reviewed: 02/11/2012 Exit[employer] Patient Information 2015 [employer]. This information is not intended to replace advice given to you by your health care provider. Make sure you discuss any questions you have with your health care provider.  

## 2015-04-20 NOTE — Addendum Note (Signed)
Addended by: Richardson Chiquito on: 04/20/2015 03:05 PM   Modules accepted: Orders

## 2015-04-20 NOTE — Progress Notes (Signed)
[facility] Visit  Patient name: Jaime Ramos MRN [id number]  Date of birth: 17-Feb-1996  CC & HPI:  Jaime Ramos is a 19 y.o. African American female presenting today for C/O pelvic pain.  She had her Nexplanon removed 2 months ago because she was having periumbilical pain that radiated to LLQ for several months.  She had a normal abdominal CT in Jan  2016, but doesn't think she was having this pain then. , She was started on COC's, but is not taking them (states is not in the mood for sex).  She says the pain went away and now has been back for the past week or so.  It hurts worse when she empties her bladder or during a BM, not affected by eating, moving.  States has IBS, but has never seen a GI doctor.    Pertinent History Reviewed:  Medical & Surgical Hx:   Past Medical History  Diagnosis Date  . Asthma   . Obesity   . Precocious puberty   . Prediabetes   . Goiter 07/31/2011  . Diabetes mellitus without complication   . Trichomonal vaginitis   . Depression     doing good   Past Surgical History  Procedure Laterality Date  . No past surgeries    . Dilation and curettage of uterus N/A 11/09/2014    Procedure: DILATATION AND CURETTAGE;  Surgeon: Allie Bossier, MD;  Location: [facility];  Service: Gynecology;  Laterality: N/A;  . Dilation and curettage of uterus  2016   Family History  Problem Relation Age of Onset  . Diabetes Mother   . Hypertension Mother   . Obesity Mother   . Fibroids Mother   . Cancer Mother     thyroid  . Obesity Father   . Obesity Maternal Aunt   . Fibroids Maternal Aunt   . Obesity Maternal Uncle   . Obesity Paternal Aunt   . Diabetes Maternal Grandmother   . Obesity Maternal Grandmother   . Hypertension Maternal Grandmother   . Diabetes Maternal Grandfather   . Obesity Maternal Grandfather   . Stroke Maternal Grandfather   . Heart disease Maternal Grandfather   . Hypertension Maternal Grandfather   . Obesity Paternal Grandfather   .  Heart disease Paternal Grandfather   . Bipolar disorder Brother   . Bipolar disorder Brother     Current outpatient prescriptions:  .  ciprofloxacin (CIPRO) 500 MG tablet, Take 1 tablet (500 mg total) by mouth 2 (two) times daily., Disp: 14 tablet, Rfl: 0 .  ibuprofen (ADVIL,MOTRIN) 200 MG tablet, Take 400 mg by mouth every 6 (six) hours as needed for moderate pain (cramping)., Disp: , Rfl:  .  ketorolac (TORADOL) 10 MG tablet, Take 1 tablet (10 mg total) by mouth every 6 (six) hours as needed., Disp: 20 tablet, Rfl: 0 .  norgestimate-ethinyl estradiol (ORTHO-CYCLEN,SPRINTEC,PREVIFEM) 0.25-35 MG-MCG tablet, Take 1 tablet by mouth daily. (Patient not taking: Reported on 03/24/2015), Disp: 1 Package, Rfl: 11 Social History: Reviewed -  reports that she has never smoked. She has never used smokeless tobacco.  Review of Systems:   Constitutional: Negative for fever and chills Eyes: Negative for visual disturbances Respiratory: Negative for shortness of breath, dyspnea Cardiovascular: Negative for chest pain or palpitations  Gastrointestinal: Negative for vomiting, diarrhea and constipation; no abdominal pain Genitourinary: Negative for dysuria and urgency, vaginal irritation or itching Musculoskeletal: Negative for back pain, joint pain, myalgias  Neurological: Negative for dizziness and headaches  Objective Findings:  Vitals: BP 100/60 mmHg  Pulse 80  Ht  (1.676 m)  Wt 251 lb (113.853 kg)  BMI 40.53 kg/m2  LMP 04/14/2015  Physical Examination: General appearance - alert, well appearing, and in no distress Mental status - alert, oriented to person, place, and time Abdomen - tender LLQ, no rebound or guarding.  Not tender in umbilical area, but feels that pain radiates in that direction Pelvic - SSE:  Thin white dc, no odor.  Wet prep + WBC, no trich, clue, or yeast.  Not tender during bimanual except on the abdominal hand  Results for orders placed or performed in visit on  04/20/15 (from the past 24 hour(s))  POCT urine pregnancy   Collection Time: 04/20/15 10:41 AM  Result Value Ref Range   Preg Test, Ur Negative Negative  POCT urinalysis dipstick   Collection Time: 04/20/15 10:42 AM  Result Value Ref Range   Color, UA     Clarity, UA     Glucose, UA neg    Bilirubin, UA     Ketones, UA neg    Spec Grav, UA     Blood, UA neg    pH, UA     Protein, UA neg    Urobilinogen, UA     Nitrite, UA neg    Leukocytes, UA Negative Negative          Assessment & Plan:  A:   LLQ pain, non acute:  ? Ovarian cyst  ? Intestinal ? infection P:  Tx leukorrhea with Cipro  BID X 7: if no improvement, pelvic US   CRESENZO-DISHMAN,Coulton Schlink CNM 04/20/2015 10:51 AM

## 2015-04-22 LAB — GC/CHLAMYDIA PROBE AMP
CHLAMYDIA, DNA PROBE: POSITIVE — AB
Neisseria gonorrhoeae by PCR: NEGATIVE

## 2015-04-25 ENCOUNTER — Telehealth: Payer: Self-pay | Admitting: Advanced Practice Midwife

## 2015-04-25 MED ORDER — AZITHROMYCIN 500 MG PO TABS: ORAL_TABLET | ORAL | Status: DC

## 2015-04-25 NOTE — Telephone Encounter (Signed)
Pt aware of +Chalmdia, will rx azithromycin 500 mg #2 take 2 po now partner to clinic, return in 4 weeks for POT , N[facility]DRC sent.

## 2015-04-27 ENCOUNTER — Encounter: Payer: Self-pay | Admitting: Advanced Practice Midwife

## 2015-04-27 DIAGNOSIS — A749 Chlamydial infection, unspecified: Secondary | ICD-10-CM | POA: Insufficient documentation

## 2015-04-28 ENCOUNTER — Telehealth: Payer: Self-pay | Admitting: *Deleted

## 2015-04-28 NOTE — Telephone Encounter (Signed)
[name], [facility], states when did pt get diagnosed with Chlamydia and when was treated. Informed positive Chlamydia on 04/20/2015, treat with Azithromycin 500 mg tablet on 04/25/2015.

## 2015-05-25 ENCOUNTER — Ambulatory Visit: Payer: Managed Care, Other (non HMO) | Admitting: Advanced Practice Midwife

## 2015-05-26 ENCOUNTER — Ambulatory Visit: Payer: Managed Care, Other (non HMO) | Admitting: Advanced Practice Midwife

## 2015-06-01 ENCOUNTER — Encounter: Payer: Self-pay | Admitting: Advanced Practice Midwife

## 2015-06-01 ENCOUNTER — Ambulatory Visit (INDEPENDENT_AMBULATORY_CARE_PROVIDER_SITE_OTHER): Payer: Managed Care, Other (non HMO) | Admitting: Advanced Practice Midwife

## 2015-06-01 VITALS — BP 100/70 | HR 76 | Wt 255.0 lb

## 2015-06-01 DIAGNOSIS — A749 Chlamydial infection, unspecified: Secondary | ICD-10-CM | POA: Diagnosis not present

## 2015-06-01 NOTE — Progress Notes (Signed)
[facility] Visit  Patient name: Jaime Ramos MRN [id number]  Date of birth: [birth year]  CC & HPI:  Jaime Ramos is a 19 y.o. African American female presenting today for POC chlamydia.  She was treated a month ago, states her partner was treated as well.  Feels as if sx are gone.  Pertinent History Reviewed:  Medical & Surgical Hx:   Past Medical History  Diagnosis Date  . Asthma   . Obesity   . Precocious puberty   . Prediabetes   . Goiter 07/31/2011  . Diabetes mellitus without complication   . Trichomonal vaginitis   . Depression     doing good   Past Surgical History  Procedure Laterality Date  . No past surgeries    . Dilation and curettage of uterus N/A 11/09/2014    Procedure: DILATATION AND CURETTAGE;  Surgeon: Allie Bossier, MD;  Location: [facility];  Service: Gynecology;  Laterality: N/A;  . Dilation and curettage of uterus  2016   Family History  Problem Relation Age of Onset  . Diabetes Mother   . Hypertension Mother   . Obesity Mother   . Fibroids Mother   . Cancer Mother     thyroid  . Obesity Father   . Obesity Maternal Aunt   . Fibroids Maternal Aunt   . Obesity Maternal Uncle   . Obesity Paternal Aunt   . Diabetes Maternal Grandmother   . Obesity Maternal Grandmother   . Hypertension Maternal Grandmother   . Diabetes Maternal Grandfather   . Obesity Maternal Grandfather   . Stroke Maternal Grandfather   . Heart disease Maternal Grandfather   . Hypertension Maternal Grandfather   . Obesity Paternal Grandfather   . Heart disease Paternal Grandfather   . Bipolar disorder Brother   . Bipolar disorder Brother     Current outpatient prescriptions:  .  azithromycin (ZITHROMAX) 500 MG tablet, Take 2 po now (Patient not taking: Reported on 06/01/2015), Disp: 2 tablet, Rfl: 0 .  ciprofloxacin (CIPRO) 500 MG tablet, Take 1 tablet (500 mg total) by mouth 2 (two) times daily. (Patient not taking: Reported on 06/01/2015), Disp: 14 tablet, Rfl:  0 .  ibuprofen (ADVIL,MOTRIN) 200 MG tablet, Take 400 mg by mouth every 6 (six) hours as needed for moderate pain (cramping)., Disp: , Rfl:  .  ketorolac (TORADOL) 10 MG tablet, Take 1 tablet (10 mg total) by mouth every 6 (six) hours as needed. (Patient not taking: Reported on 06/01/2015), Disp: 20 tablet, Rfl: 0 .  naproxen (NAPROSYN) 500 MG tablet, Take 1 tablet (500 mg total) by mouth 2 (two) times daily with a meal. (Patient not taking: Reported on 06/01/2015), Disp: 30 tablet, Rfl: 1 .  norgestimate-ethinyl estradiol (ORTHO-CYCLEN,SPRINTEC,PREVIFEM) 0.25-35 MG-MCG tablet, Take 1 tablet by mouth daily. (Patient not taking: Reported on 03/24/2015), Disp: 1 Package, Rfl: 11 Social History: Reviewed -  reports that she has never smoked. She has never used smokeless tobacco.  Review of Systems:   Constitutional: Negative for fever and chills Eyes: Negative for visual disturbances Respiratory: Negative for shortness of breath, dyspnea Cardiovascular: Negative for chest pain or palpitations  Gastrointestinal: Negative for vomiting, diarrhea and constipation; no abdominal pain Genitourinary: Negative for dysuria and urgency, vaginal irritation or itching Musculoskeletal: Negative for back pain, joint pain, myalgias  Neurological: Negative for dizziness and headaches    Objective Findings:  Vitals: BP 100/70 mmHg  Pulse 76  Wt 255 lb (115.667 kg)  LMP 05/08/2015  Physical  Examination: General appearance - alert, well appearing, and in no distress Mental status - alert, oriented to person, place, and time Chest - normal respiratory effort  No results found for this or any previous visit (from the past 24 hour(s)).   50% or more of this visit was spent in counseling and coordination of care.  10 minutes of face to face time.  Assessment & Plan:  A:   POC chlamydia P:  Run test on urine   CRESENZO-DISHMAN,Larry Knipp CNM 06/01/2015 12:27 PM

## 2015-06-03 LAB — GC/CHLAMYDIA PROBE AMP
Chlamydia trachomatis, NAA: NEGATIVE
NEISSERIA GONORRHOEAE BY PCR: NEGATIVE

## 2015-06-29 ENCOUNTER — Encounter (HOSPITAL_COMMUNITY): Payer: Self-pay

## 2015-06-29 ENCOUNTER — Emergency Department (HOSPITAL_COMMUNITY)
Admission: EM | Admit: 2015-06-29 | Discharge: 2015-06-30 | Disposition: A | Discharge status: Home / Self Care | Payer: Managed Care, Other (non HMO) | Attending: Emergency Medicine | Admitting: Emergency Medicine

## 2015-06-29 DIAGNOSIS — H9190 Unspecified hearing loss, unspecified ear: Secondary | ICD-10-CM | POA: Diagnosis not present

## 2015-06-29 DIAGNOSIS — M791 Myalgia, unspecified site: Secondary | ICD-10-CM

## 2015-06-29 DIAGNOSIS — Z3202 Encounter for pregnancy test, result negative: Secondary | ICD-10-CM | POA: Diagnosis not present

## 2015-06-29 DIAGNOSIS — G44209 Tension-type headache, unspecified, not intractable: Secondary | ICD-10-CM | POA: Insufficient documentation

## 2015-06-29 DIAGNOSIS — E669 Obesity, unspecified: Secondary | ICD-10-CM | POA: Diagnosis not present

## 2015-06-29 DIAGNOSIS — E119 Type 2 diabetes mellitus without complications: Secondary | ICD-10-CM | POA: Diagnosis not present

## 2015-06-29 DIAGNOSIS — R197 Diarrhea, unspecified: Secondary | ICD-10-CM | POA: Insufficient documentation

## 2015-06-29 DIAGNOSIS — Z8619 Personal history of other infectious and parasitic diseases: Secondary | ICD-10-CM | POA: Diagnosis not present

## 2015-06-29 DIAGNOSIS — Z793 Long term (current) use of hormonal contraceptives: Secondary | ICD-10-CM | POA: Diagnosis not present

## 2015-06-29 DIAGNOSIS — J45909 Unspecified asthma, uncomplicated: Secondary | ICD-10-CM | POA: Diagnosis not present

## 2015-06-29 DIAGNOSIS — Z8659 Personal history of other mental and behavioral disorders: Secondary | ICD-10-CM | POA: Diagnosis not present

## 2015-06-29 NOTE — ED Notes (Signed)
Pt complains of body aches and a migraine for one week, today she also states that she can't hear from her left ear

## 2015-06-30 DIAGNOSIS — M791 Myalgia: Secondary | ICD-10-CM | POA: Diagnosis not present

## 2015-06-30 LAB — URINALYSIS, ROUTINE W REFLEX MICROSCOPIC
BILIRUBIN URINE: NEGATIVE
Glucose, UA: NEGATIVE mg/dL
HGB URINE DIPSTICK: NEGATIVE
Ketones, ur: NEGATIVE mg/dL
Nitrite: NEGATIVE
Protein, ur: NEGATIVE mg/dL
SPECIFIC GRAVITY, URINE: 1.024 (ref 1.005–1.030)
Urobilinogen, UA: 0.2 mg/dL (ref 0.0–1.0)
pH: 7 (ref 5.0–8.0)

## 2015-06-30 LAB — I-STAT CHEM 8, ED
BUN: 5 mg/dL — ABNORMAL LOW (ref 6–20)
CALCIUM ION: 1.22 mmol/L (ref 1.12–1.23)
CHLORIDE: 101 mmol/L (ref 101–111)
CREATININE: 0.5 mg/dL (ref 0.44–1.00)
Glucose, Bld: 100 mg/dL — ABNORMAL HIGH (ref 65–99)
HCT: 41 % (ref 36.0–46.0)
Hemoglobin: 13.9 g/dL (ref 12.0–15.0)
Potassium: 3.9 mmol/L (ref 3.5–5.1)
Sodium: 139 mmol/L (ref 135–145)
TCO2: 23 mmol/L (ref 0–100)

## 2015-06-30 LAB — URINE MICROSCOPIC-ADD ON

## 2015-06-30 LAB — CBC WITH DIFFERENTIAL/PLATELET
Basophils Absolute: 0 10*3/uL (ref 0.0–0.1)
Basophils Relative: 0 % (ref 0–1)
EOS PCT: 5 % (ref 0–5)
Eosinophils Absolute: 0.4 10*3/uL (ref 0.0–0.7)
HCT: 38.8 % (ref 36.0–46.0)
Hemoglobin: 12.1 g/dL (ref 12.0–15.0)
LYMPHS PCT: 25 % (ref 12–46)
Lymphs Abs: 2.4 10*3/uL (ref 0.7–4.0)
MCH: 24.1 pg — AB (ref 26.0–34.0)
MCHC: 31.2 g/dL (ref 30.0–36.0)
MCV: 77.1 fL — AB (ref 78.0–100.0)
MONO ABS: 0.9 10*3/uL (ref 0.1–1.0)
Monocytes Relative: 9 % (ref 3–12)
Neutro Abs: 5.9 10*3/uL (ref 1.7–7.7)
Neutrophils Relative %: 61 % (ref 43–77)
PLATELETS: 386 10*3/uL (ref 150–400)
RBC: 5.03 MIL/uL (ref 3.87–5.11)
RDW: 15.1 % (ref 11.5–15.5)
WBC: 9.7 10*3/uL (ref 4.0–10.5)

## 2015-06-30 LAB — POC URINE PREG, ED: Preg Test, Ur: NEGATIVE

## 2015-06-30 MED ORDER — LOPERAMIDE HCL 2 MG PO CAPS: 2.0000 mg | ORAL_CAPSULE | Freq: Four times a day (QID) | ORAL | Status: DC | PRN

## 2015-06-30 MED ORDER — SODIUM CHLORIDE 0.9 % IV BOLUS (SEPSIS)
1000.0000 mL | Freq: Once | INTRAVENOUS | Status: AC
  Administered 2015-06-30: 1000 mL via INTRAVENOUS

## 2015-06-30 MED ORDER — KETOROLAC TROMETHAMINE 30 MG/ML IJ SOLN
30.0000 mg | Freq: Once | INTRAMUSCULAR | Status: AC
  Administered 2015-06-30: 30 mg via INTRAVENOUS
  Filled 2015-06-30: qty 1

## 2015-06-30 NOTE — ED Provider Notes (Signed)
CSN: [id number]     Arrival date & time 06/29/15  2248 History   First MD Initiated Contact with Patient 06/30/15 0034     Chief Complaint  Patient presents with  . Generalized Body Aches     (Consider location/radiation/quality/duration/timing/severity/associated sxs/prior Treatment) HPI Comments: Patient states that for the past 3 weeks.  His stools for the past 2 weeks.  She's had body aches for the past week.  She's had a headache and neck pain.  She has done nothing for any of her symptoms except several days ago.  She took ibuprofen at night.  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  Nausea, any nausea, vomiting, fever, URI symptoms, sinus congestion, seasonal allergies  The history is provided by the patient.    Past Medical History  Diagnosis Date  . Asthma   . Obesity   . Precocious puberty   . Prediabetes   . Goiter 07/31/2011  . Diabetes mellitus without complication   . Trichomonal vaginitis   . Depression     doing good   Past Surgical History  Procedure Laterality Date  . No past surgeries    . Dilation and curettage of uterus N/A 11/09/2014    Procedure: DILATATION AND CURETTAGE;  Surgeon: Allie Bossier, MD;  Location: [facility];  Service: Gynecology;  Laterality: N/A;  . Dilation and curettage of uterus  2016   Family History  Problem Relation Age of Onset  . Diabetes Mother   . Hypertension Mother   . Obesity Mother   . Fibroids Mother   . Cancer Mother     thyroid  . Obesity Father   . Obesity Maternal Aunt   . Fibroids Maternal Aunt   . Obesity Maternal Uncle   . Obesity Paternal Aunt   . Diabetes Maternal Grandmother   . Obesity Maternal Grandmother   . Hypertension Maternal Grandmother   . Diabetes Maternal Grandfather   . Obesity Maternal Grandfather   . Stroke Maternal Grandfather   . Heart disease Maternal Grandfather   . Hypertension Maternal Grandfather    . Obesity Paternal Grandfather   . Heart disease Paternal Grandfather   . Bipolar disorder Brother   . Bipolar disorder Brother    Social History  Substance Use Topics  . Smoking status: Never Smoker   . Smokeless tobacco: Never Used  . Alcohol Use: No   OB History    Gravida Para Term Preterm AB TAB SAB Ectopic Multiple Living   2 1  1 1  1    0     Review of Systems  Constitutional: Negative for fever and chills.  HENT: Positive for hearing loss. Negative for congestion.   Eyes: Negative for photophobia and pain.  Respiratory: Positive for cough. Negative for shortness of breath.   Gastrointestinal: Positive for diarrhea. Negative for nausea and vomiting.  Musculoskeletal: Positive for myalgias.  Skin: Negative for rash.  Neurological: Positive for headaches. Negative for dizziness, weakness, light-headedness and numbness.  All other systems reviewed and are negative.     Allergies  Review of patient's allergies indicates no known allergies.  Home Medications   Prior to Admission medications   Medication Sig Start Date End Date Taking? Authorizing Provider  ibuprofen (ADVIL,MOTRIN) 200 MG tablet Take 400 mg by mouth every 6 (six) hours as needed for moderate pain (cramping).   Yes Historical Provider, MD  azithromycin (ZITHROMAX) 500 MG tablet Take 2 po now Patient not taking: Reported on 06/01/2015 04/25/15   Adline Potter, NP  ciprofloxacin (CIPRO) 500 MG tablet Take 1 tablet (500 mg total) by mouth 2 (two) times daily. Patient not taking: Reported on 06/01/2015 04/20/15   Jacklyn Shell, CNM  ketorolac (TORADOL) 10 MG tablet Take 1 tablet (10 mg total) by mouth every 6 (six) hours as needed. Patient not taking: Reported on 06/01/2015 04/20/15   Jacklyn Shell, CNM  loperamide (IMODIUM) 2 MG capsule Take 1 capsule (2 mg total) by mouth 4 (four) times daily as needed for diarrhea or loose stools. 06/30/15   Earley Favor, NP  naproxen (NAPROSYN) 500 MG  tablet Take 1 tablet (500 mg total) by mouth 2 (two) times daily with a meal. Patient not taking: Reported on 06/01/2015 04/20/15   Jacklyn Shell, CNM  norgestimate-ethinyl estradiol (ORTHO-CYCLEN,SPRINTEC,PREVIFEM) 0.25-35 MG-MCG tablet Take 1 tablet by mouth daily. Patient not taking: Reported on 06/29/2015 02/09/15   Jacklyn Shell, CNM   BP 97/69 mmHg  Pulse 77  Temp(Src) 98.7 F (37.1 C) (Oral)  Resp 26  SpO2 100%  LMP 06/09/2015 Physical Exam  Constitutional: She appears well-developed and well-nourished.  HENT:  Head: Normocephalic.  Eyes: Pupils are equal, round, and reactive to light.  Neck: Normal range of motion.  Cardiovascular: Normal rate and regular rhythm.   Pulmonary/Chest: Effort normal and breath sounds normal.  Abdominal: Soft. Bowel sounds are normal. She exhibits no distension. There is no tenderness.  Musculoskeletal: Normal range of motion.  Neurological: She is alert.  Skin: Skin is warm and dry.  Nursing note and vitals reviewed.   ED Course  Procedures (including critical care time) Labs Review Labs Reviewed  URINALYSIS, ROUTINE W REFLEX MICROSCOPIC (NOT AT [facility]) - Abnormal; Notable for the following:    APPearance CLOUDY (*)    Leukocytes, UA SMALL (*)    All other components within normal limits  CBC WITH DIFFERENTIAL/PLATELET - Abnormal; Notable for the following:    MCV 77.1 (*)    MCH 24.1 (*)    All other components within normal limits  URINE MICROSCOPIC-ADD ON - Abnormal; Notable for the following:    Squamous Epithelial / LPF FEW (*)    Bacteria, UA MANY (*)    All other components within normal limits  I-STAT CHEM 8, ED - Abnormal; Notable for the following:    BUN 5 (*)    Glucose, Bld 100 (*)    All other components within normal limits  POC URINE PREG, ED    Imaging Review No results found. I have personally reviewed and evaluated these images and lab results as part of my medical decision-making.   EKG  Interpretation None     labs and urine have been checked.  There is nothing significant in either of these to contribute to the patient's symptoms,  patient has had no bowel movement since arriving in the emergency department.  She was given IV fluids and Toradol.  She states she is feeling significantly better.  She will be discharged home with a prescription for Imodium if needed.  Instructions on diet to help with her loose stools.  MDM   Final diagnoses:  Diarrhea  Myalgia  Tension-type headache, not intractable, unspecified chronicity pattern         Earley Favor, NP 06/30/15 0249  Earley Favor, NP 06/30/15 4034  Devoria Albe, MD 06/30/15 [phone]

## 2015-07-13 ENCOUNTER — Ambulatory Visit: Payer: Managed Care, Other (non HMO) | Admitting: Advanced Practice Midwife

## 2015-07-25 ENCOUNTER — Emergency Department (HOSPITAL_COMMUNITY): Payer: Managed Care, Other (non HMO)

## 2015-07-25 ENCOUNTER — Emergency Department (HOSPITAL_COMMUNITY)
Admission: EM | Admit: 2015-07-25 | Discharge: 2015-07-26 | Disposition: A | Discharge status: Home / Self Care | Payer: Managed Care, Other (non HMO) | Attending: Emergency Medicine | Admitting: Emergency Medicine

## 2015-07-25 ENCOUNTER — Encounter (HOSPITAL_COMMUNITY): Payer: Self-pay | Admitting: Emergency Medicine

## 2015-07-25 DIAGNOSIS — R42 Dizziness and giddiness: Secondary | ICD-10-CM | POA: Insufficient documentation

## 2015-07-25 DIAGNOSIS — E669 Obesity, unspecified: Secondary | ICD-10-CM | POA: Insufficient documentation

## 2015-07-25 DIAGNOSIS — R51 Headache: Secondary | ICD-10-CM | POA: Insufficient documentation

## 2015-07-25 DIAGNOSIS — Z3202 Encounter for pregnancy test, result negative: Secondary | ICD-10-CM | POA: Diagnosis not present

## 2015-07-25 DIAGNOSIS — R5383 Other fatigue: Secondary | ICD-10-CM | POA: Insufficient documentation

## 2015-07-25 DIAGNOSIS — J069 Acute upper respiratory infection, unspecified: Secondary | ICD-10-CM | POA: Insufficient documentation

## 2015-07-25 DIAGNOSIS — R531 Weakness: Secondary | ICD-10-CM | POA: Insufficient documentation

## 2015-07-25 DIAGNOSIS — J45901 Unspecified asthma with (acute) exacerbation: Secondary | ICD-10-CM | POA: Diagnosis not present

## 2015-07-25 DIAGNOSIS — R630 Anorexia: Secondary | ICD-10-CM | POA: Insufficient documentation

## 2015-07-25 DIAGNOSIS — R042 Hemoptysis: Secondary | ICD-10-CM | POA: Diagnosis present

## 2015-07-25 DIAGNOSIS — E119 Type 2 diabetes mellitus without complications: Secondary | ICD-10-CM | POA: Diagnosis not present

## 2015-07-25 LAB — CBC WITH DIFFERENTIAL/PLATELET
BASOS ABS: 0 10*3/uL (ref 0.0–0.1)
BASOS PCT: 0 %
Eosinophils Absolute: 0.2 10*3/uL (ref 0.0–0.7)
Eosinophils Relative: 1 %
HEMATOCRIT: 36.4 % (ref 36.0–46.0)
HEMOGLOBIN: 11.4 g/dL — AB (ref 12.0–15.0)
LYMPHS PCT: 23 %
Lymphs Abs: 2.7 10*3/uL (ref 0.7–4.0)
MCH: 24.2 pg — ABNORMAL LOW (ref 26.0–34.0)
MCHC: 31.3 g/dL (ref 30.0–36.0)
MCV: 77.1 fL — ABNORMAL LOW (ref 78.0–100.0)
Monocytes Absolute: 1 10*3/uL (ref 0.1–1.0)
Monocytes Relative: 9 %
NEUTROS ABS: 8.1 10*3/uL — AB (ref 1.7–7.7)
NEUTROS PCT: 67 %
Platelets: 322 10*3/uL (ref 150–400)
RBC: 4.72 MIL/uL (ref 3.87–5.11)
RDW: 15.2 % (ref 11.5–15.5)
WBC: 12.1 10*3/uL — ABNORMAL HIGH (ref 4.0–10.5)

## 2015-07-25 LAB — BASIC METABOLIC PANEL
ANION GAP: 7 (ref 5–15)
BUN: 6 mg/dL (ref 6–20)
CALCIUM: 8.8 mg/dL — AB (ref 8.9–10.3)
CHLORIDE: 104 mmol/L (ref 101–111)
CO2: 25 mmol/L (ref 22–32)
Creatinine, Ser: 0.56 mg/dL (ref 0.44–1.00)
GFR calc non Af Amer: >60 mL/min (ref >60)
Glucose, Bld: 100 mg/dL — ABNORMAL HIGH (ref 65–99)
POTASSIUM: 3.7 mmol/L (ref 3.5–5.1)
Sodium: 136 mmol/L (ref 135–145)

## 2015-07-25 MED ORDER — ALBUTEROL SULFATE (2.5 MG/3ML) 0.083% IN NEBU
5.0000 mg | INHALATION_SOLUTION | Freq: Once | RESPIRATORY_TRACT | Status: AC
  Administered 2015-07-25: 5 mg via RESPIRATORY_TRACT
  Filled 2015-07-25: qty 6

## 2015-07-25 MED ORDER — ALBUTEROL SULFATE (2.5 MG/3ML) 0.083% IN NEBU: 5.0000 mg | INHALATION_SOLUTION | Freq: Once | RESPIRATORY_TRACT | Status: DC

## 2015-07-25 MED ORDER — IPRATROPIUM BROMIDE 0.02 % IN SOLN: 0.5000 mg | Freq: Once | RESPIRATORY_TRACT | Status: DC

## 2015-07-25 MED ORDER — IPRATROPIUM BROMIDE 0.02 % IN SOLN
0.5000 mg | Freq: Once | RESPIRATORY_TRACT | Status: AC
  Administered 2015-07-25: 0.5 mg via RESPIRATORY_TRACT
  Filled 2015-07-25: qty 2.5

## 2015-07-25 NOTE — ED Notes (Signed)
Patient reports nasal congestion with yellow mucus and streaks of blood in mucus, productive cough with yellow mucus, hemoptysis, chills, SOB onset 2 days ago.

## 2015-07-25 NOTE — ED Provider Notes (Signed)
CSN: [id number]     Arrival date & time 07/25/15  1741 History   First MD Initiated Contact with Patient 07/25/15 2121     Chief Complaint  Patient presents with  . Hemoptysis  . Chills  . Headache    HPI  Jaime Ramos is a 19 y.o. female with a PMH of asthma, obesity, DM, depression who presents to the ED with nasal congestion, cough productive of yellow sputum, and hemoptysis x 3 days. She is unable to quantify the amount of blood present in her sputum, but states sometimes she coughs up yellow mucus, and sometimes she coughs up blood. She states this has never happened to her before. She denies sick contact. She reports subjective fever and chills. She reports mild headache, dizziness, and lightheadedness. She denies changes in vision. She reports chest tightness and shortness of breath. She denies abdominal pain, vomiting, diarrhea, constipation, dysuria, urgency, frequency. She reports nausea with cough. She has not tried anything for symptom relief.    Past Medical History  Diagnosis Date  . Asthma   . Obesity   . Precocious puberty   . Prediabetes   . Goiter 07/31/2011  . Diabetes mellitus without complication   . Trichomonal vaginitis   . Depression     doing good   Past Surgical History  Procedure Laterality Date  . No past surgeries    . Dilation and curettage of uterus N/A 11/09/2014    Procedure: DILATATION AND CURETTAGE;  Surgeon: Allie Bossier, MD;  Location: [facility];  Service: Gynecology;  Laterality: N/A;  . Dilation and curettage of uterus  2016   Family History  Problem Relation Age of Onset  . Diabetes Mother   . Hypertension Mother   . Obesity Mother   . Fibroids Mother   . Cancer Mother     thyroid  . Obesity Father   . Obesity Maternal Aunt   . Fibroids Maternal Aunt   . Obesity Maternal Uncle   . Obesity Paternal Aunt   . Diabetes Maternal Grandmother   . Obesity Maternal Grandmother   . Hypertension Maternal Grandmother   . Diabetes Maternal  Grandfather   . Obesity Maternal Grandfather   . Stroke Maternal Grandfather   . Heart disease Maternal Grandfather   . Hypertension Maternal Grandfather   . Obesity Paternal Grandfather   . Heart disease Paternal Grandfather   . Bipolar disorder Brother   . Bipolar disorder Brother    Social History  Substance Use Topics  . Smoking status: Never Smoker   . Smokeless tobacco: Never Used  . Alcohol Use: No   OB History    Gravida Para Term Preterm AB TAB SAB Ectopic Multiple Living   0      Review of Systems  Constitutional: Positive for fever, chills, appetite change and fatigue.       Reports decreased appetite.  HENT: Positive for congestion and sinus pressure.   Eyes: Negative for visual disturbance.  Respiratory: Positive for cough, chest tightness and shortness of breath.   Cardiovascular: Positive for chest pain. Negative for leg swelling.  Gastrointestinal: Positive for nausea. Negative for vomiting, abdominal pain, diarrhea and constipation.       Reports nausea with cough.  Genitourinary: Negative for dysuria, urgency and frequency.  Neurological: Positive for dizziness, weakness, light-headedness and headaches. Negative for syncope and numbness.       Reports generalized weakness.      Allergies  Review of patient's allergies indicates no known allergies.  Home Medications   Prior to Admission medications   Medication Sig Start Date End Date Taking? Authorizing Provider  azithromycin (ZITHROMAX) 500 MG tablet Take 2 po now Patient not taking: Reported on 06/01/2015 04/25/15   Adline Potter, NP  ciprofloxacin (CIPRO) 500 MG tablet Take 1 tablet (500 mg total) by mouth 2 (two) times daily. Patient not taking: Reported on 06/01/2015 04/20/15   Jacklyn Shell, CNM  ketorolac (TORADOL) 10 MG tablet Take 1 tablet (10 mg total) by mouth every 6 (six) hours as needed. Patient not taking: Reported on 06/01/2015 04/20/15   Jacklyn Shell, CNM  loperamide (IMODIUM) 2 MG capsule Take 1 capsule (2 mg total) by mouth 4 (four) times daily as needed for diarrhea or loose stools. Patient not taking: Reported on 07/25/2015 06/30/15   Earley Favor, NP  naproxen (NAPROSYN) 500 MG tablet Take 1 tablet (500 mg total) by mouth 2 (two) times daily with a meal. Patient not taking: Reported on 06/01/2015 04/20/15   Jacklyn Shell, CNM  norgestimate-ethinyl estradiol (ORTHO-CYCLEN,SPRINTEC,PREVIFEM) 0.25-35 MG-MCG tablet Take 1 tablet by mouth daily. Patient not taking: Reported on 06/29/2015 02/09/15   Jacklyn Shell, CNM    BP 100/55 mmHg  Pulse 98  Temp(Src) 98.7 F (37.1 C) (Oral)  Resp 18  SpO2 98%  LMP 06/09/2015 Physical Exam  Constitutional: She is oriented to person, place, and time. No distress.  Obese female in no acute distress.  HENT:  Head: Normocephalic and atraumatic.  Right Ear: Tympanic membrane, external ear and ear canal normal.  Left Ear: Tympanic membrane, external ear and ear canal normal.  Nose: Right sinus exhibits maxillary sinus tenderness. Left sinus exhibits maxillary sinus tenderness.  Mouth/Throat: Uvula is midline, oropharynx is clear and moist and mucous membranes are normal. No oropharyngeal exudate.  Mild tenderness to palpation of paranasal sinuses.  Eyes: Conjunctivae, EOM and lids are normal. Pupils are equal, round, and reactive to light. Right eye exhibits no discharge. Left eye exhibits no discharge. No scleral icterus.  Neck: Normal range of motion. Neck supple.  Cardiovascular: Normal rate, regular rhythm, normal heart sounds, intact distal pulses and normal pulses.   Pulmonary/Chest: Effort normal and breath sounds normal. No respiratory distress. She has no wheezes. She has no rales.  Abdominal: Soft. Normal appearance and bowel sounds are normal. She exhibits no distension and no mass. There is tenderness. There is no rigidity, no rebound and no guarding.  Mild  tenderness to palpation in right upper quadrant. No rebound, guarding, or masses.  Musculoskeletal: Normal range of motion. She exhibits no edema or tenderness.  Lymphadenopathy:    She has no cervical adenopathy.  Neurological: She is alert and oriented to person, place, and time.  Skin: Skin is warm, dry and intact. No rash noted. She is not diaphoretic. No erythema. No pallor.  Psychiatric: She has a normal mood and affect. Her speech is normal and behavior is normal. Judgment and thought content normal.  Nursing note and vitals reviewed.   ED Course  Procedures (including critical care time)  Labs Review Labs Reviewed  CBC WITH DIFFERENTIAL/PLATELET - Abnormal; Notable for the following:    WBC 12.1 (*)    Hemoglobin 11.4 (*)    MCV 77.1 (*)    MCH 24.2 (*)    Neutro Abs 8.1 (*)    All other components within normal limits  BASIC METABOLIC PANEL - Abnormal; Notable for the following:    Glucose,  Bld 100 (*)    Calcium 8.8 (*)    All other components within normal limits  D-DIMER, QUANTITATIVE (NOT AT [facility]) - Abnormal; Notable for the following:    D-Dimer, Quant 0.51 (*)    All other components within normal limits  POC URINE PREG, ED    Imaging Review Dg Chest 2 View  07/25/2015   CLINICAL DATA:  Productive cough and shortness of breath for 2 days.  EXAM: CHEST  2 VIEW  COMPARISON:  05/10/2015  FINDINGS: The cardiomediastinal silhouette is unremarkable.  Mild peribronchial thickening again noted.  There is no evidence of focal airspace disease, pulmonary edema, suspicious pulmonary nodule/mass, pleural effusion, or pneumothorax. No acute bony abnormalities are identified.  IMPRESSION: Mild peribronchial thickening without focal pneumonia.   Electronically Signed   By: Harmon Pier M.D.   On: 07/25/2015 18:47   I have personally reviewed and evaluated these images and lab results as part of my medical decision-making.   EKG Interpretation None      MDM   Final  diagnoses:  None    19 year old female presents with nasal congestion, cough productive of yellow sputum, and hemoptysis x 3 days. Denies sick contact. Reports subjective fever and chills, mild headache, dizziness, and lightheadedness. Denies changes in vision. Reports chest tightness and shortness of breath. Denies abdominal pain, vomiting, diarrhea, constipation, dysuria, urgency, frequency. Reports nausea with cough.   Patient is afebrile. Mild tachycardia and tachypnea. Patient given albuterol/atrovent nebulizer with minimal symptom improvement. Heart regular rate and rhythm. Lungs clear to auscultation bilaterally. Abdomen soft, nontender, nondistended. No lower extremity edema. Patient denies history of DVT, history of malignancy, estrogen use, tobacco abuse, recent travel or immobility, recent surgery.   CBC with mild leukocytosis with WBC 12.1, hemoglobin 11.4. BMP unremarkable. Chest x-ray with mild peribronchial thickening with no focal pneumonia. Patient mildly tachycardic and tachypneic in the ED. D-dimer obtained, which was positive at 0.51. Urine pregnancy negative. Will obtain CT angiogram of chest.   2:16 AM Patient signed out to Elpidio Anis, PA-C. CT angio pending. If CT angio negative, patient stable for discharge home, symptoms likely due to viral process. Will treat with supportive care and close PCP follow-up.   BP 121/54 mmHg  Pulse 83  Temp(Src) 99.8 F (37.7 C) (Oral)  Resp 20  SpO2 98%  LMP 06/09/2015      Mady Gemma, PA-C 07/26/15 [id number]  Laurence Spates, MD 07/27/15 1415

## 2015-07-26 ENCOUNTER — Emergency Department (HOSPITAL_COMMUNITY): Payer: Managed Care, Other (non HMO)

## 2015-07-26 ENCOUNTER — Encounter (HOSPITAL_COMMUNITY): Payer: Self-pay

## 2015-07-26 DIAGNOSIS — J069 Acute upper respiratory infection, unspecified: Secondary | ICD-10-CM | POA: Diagnosis not present

## 2015-07-26 LAB — POC URINE PREG, ED: PREG TEST UR: NEGATIVE

## 2015-07-26 LAB — D-DIMER, QUANTITATIVE: D-Dimer, Quant: 0.51 ug/mL-FEU — ABNORMAL HIGH (ref 0.00–0.48)

## 2015-07-26 MED ORDER — IOHEXOL 350 MG/ML SOLN
100.0000 mL | Freq: Once | INTRAVENOUS | Status: AC | PRN
  Administered 2015-07-26: 100 mL via INTRAVENOUS

## 2015-07-26 NOTE — ED Provider Notes (Signed)
URI, cough, hemoptysis Labs unremarkable, small leukocytosis CXR - bronchitis Elevated d-dimer - CT angio pending Plan - neg CT, suggests viral process requiring supportive care.   CT negative. Will discharge home with URI care instructions. Return precautions discussed.   Elpidio Anis, PA-C 07/26/15 0321  Azalia Bilis, MD 07/26/15 [phone]

## 2015-07-26 NOTE — Discharge Instructions (Signed)
Upper Respiratory Infection, Adult °An upper respiratory infection (URI) is also sometimes known as the common cold. The upper respiratory tract includes the nose, sinuses, throat, trachea, and bronchi. Bronchi are the airways leading to the lungs. Most people improve within 1 week, but symptoms can last up to 2 weeks. A residual cough may last even longer.  °CAUSES °Many different viruses can infect the tissues lining the upper respiratory tract. The tissues become irritated and inflamed and often become very moist. Mucus production is also common. A cold is contagious. You can easily spread the virus to others by oral contact. This includes kissing, sharing a glass, coughing, or sneezing. Touching your mouth or nose and then touching a surface, which is then touched by another person, can also spread the virus. °SYMPTOMS  °Symptoms typically develop 1 to 3 days after you come in contact with a cold virus. Symptoms vary from person to person. They may include: °· Runny nose. °· Sneezing. °· Nasal congestion. °· Sinus irritation. °· Sore throat. °· Loss of voice (laryngitis). °· Cough. °· Fatigue. °· Muscle aches. °· Loss of appetite. °· Headache. °· Low-grade fever. °DIAGNOSIS  °You might diagnose your own cold based on familiar symptoms, since most people get a cold 2 to 3 times a year. Your caregiver can confirm this based on your exam. Most importantly, your caregiver can check that your symptoms are not due to another disease such as strep throat, sinusitis, pneumonia, asthma, or epiglottitis. Blood tests, throat tests, and X-rays are not necessary to diagnose a common cold, but they may sometimes be helpful in excluding other more serious diseases. Your caregiver will decide if any further tests are required. °RISKS AND COMPLICATIONS  °You may be at risk for a more severe case of the common cold if you smoke cigarettes, have chronic heart disease (such as heart failure) or lung disease (such as asthma), or if  you have a weakened immune system. The very young and very old are also at risk for more serious infections. Bacterial sinusitis, middle ear infections, and bacterial pneumonia can complicate the common cold. The common cold can worsen asthma and chronic obstructive pulmonary disease (COPD). Sometimes, these complications can require emergency medical care and may be life-threatening. °PREVENTION  °The best way to protect against getting a cold is to practice good hygiene. Avoid oral or hand contact with people with cold symptoms. Wash your hands often if contact occurs. There is no clear evidence that vitamin C, vitamin E, echinacea, or exercise reduces the chance of developing a cold. However, it is always recommended to get plenty of rest and practice good nutrition. °TREATMENT  °Treatment is directed at relieving symptoms. There is no cure. Antibiotics are not effective, because the infection is caused by a virus, not by bacteria. Treatment may include: °· Increased fluid intake. Sports drinks offer valuable electrolytes, sugars, and fluids. °· Breathing heated mist or steam (vaporizer or shower). °· Eating chicken soup or other clear broths, and maintaining good nutrition. °· Getting plenty of rest. °· Using gargles or lozenges for comfort. °· Controlling fevers with ibuprofen or acetaminophen as directed by your caregiver. °· Increasing usage of your inhaler if you have asthma. °Zinc gel and zinc lozenges, taken in the first 24 hours of the common cold, can shorten the duration and lessen the severity of symptoms. Pain medicines may help with fever, muscle aches, and throat pain. A variety of non-prescription medicines are available to treat congestion and runny nose. Your caregiver   can make recommendations and may suggest nasal or lung inhalers for other symptoms.  °HOME CARE INSTRUCTIONS  °· Only take over-the-counter or prescription medicines for pain, discomfort, or fever as directed by your  caregiver. °· Use a warm mist humidifier or inhale steam from a shower to increase air moisture. This may keep secretions moist and make it easier to breathe. °· Drink enough water and fluids to keep your urine clear or pale yellow. °· Rest as needed. °· Return to work when your temperature has returned to normal or as your caregiver advises. You may need to stay home longer to avoid infecting others. You can also use a face mask and careful hand washing to prevent spread of the virus. °SEEK MEDICAL CARE IF:  °· After the first few days, you feel you are getting worse rather than better. °· You need your caregiver's advice about medicines to control symptoms. °· You develop chills, worsening shortness of breath, or brown or red sputum. These may be signs of pneumonia. °· You develop yellow or brown nasal discharge or pain in the face, especially when you bend forward. These may be signs of sinusitis. °· You develop a fever, swollen neck glands, pain with swallowing, or white areas in the back of your throat. These may be signs of strep throat. °SEEK IMMEDIATE MEDICAL CARE IF:  °· You have a fever. °· You develop severe or persistent headache, ear pain, sinus pain, or chest pain. °· You develop wheezing, a prolonged cough, cough up blood, or have a change in your usual mucus (if you have chronic lung disease). °· You develop sore muscles or a stiff neck. °Document Released: 04/17/2001 Document Revised: 01/14/2012 Document Reviewed: 01/27/2014 °[employer]® Patient Information ©2015 [employer]. This information is not intended to replace advice given to you by your health care provider. Make sure you discuss any questions you have with your health care provider. ° °Cough, Adult ° A cough is a reflex that helps clear your throat and airways. It can help heal the body or may be a reaction to an irritated airway. A cough may only last 2 or 3 weeks (acute) or may last more than 8 weeks (chronic).  °CAUSES °Acute  cough: °· Viral or bacterial infections. °Chronic cough: °· Infections. °· Allergies. °· Asthma. °· Post-nasal drip. °· Smoking. °· Heartburn or acid reflux. °· Some medicines. °· Chronic lung problems (COPD). °· Cancer. °SYMPTOMS  °· Cough. °· Fever. °· Chest pain. °· Increased breathing rate. °· High-pitched whistling sound when breathing (wheezing). °· Colored mucus that you cough up (sputum). °TREATMENT  °· A bacterial cough may be treated with antibiotic medicine. °· A viral cough must run its course and will not respond to antibiotics. °· Your caregiver may recommend other treatments if you have a chronic cough. °HOME CARE INSTRUCTIONS  °· Only take over-the-counter or prescription medicines for pain, discomfort, or fever as directed by your caregiver. Use cough suppressants only as directed by your caregiver. °· Use a cold steam vaporizer or humidifier in your bedroom or home to help loosen secretions. °· Sleep in a semi-upright position if your cough is worse at night. °· Rest as needed. °· Stop smoking if you smoke. °SEEK IMMEDIATE MEDICAL CARE IF:  °· You have pus in your sputum. °· Your cough starts to worsen. °· You cannot control your cough with suppressants and are losing sleep. °· You begin coughing up blood. °· You have difficulty breathing. °· You develop pain which is   getting worse or is uncontrolled with medicine. °· You have a fever. °MAKE SURE YOU:  °· Understand these instructions. °· Will watch your condition. °· Will get help right away if you are not doing well or get worse. °Document Released: 04/20/2011 Document Revised: 01/14/2012 Document Reviewed: 04/20/2011 °[employer]® Patient Information ©2015 [employer]. This information is not intended to replace advice given to you by your health care provider. Make sure you discuss any questions you have with your health care provider. ° °

## 2015-07-26 NOTE — ED Notes (Signed)
Patient transported to CT 

## 2015-08-23 ENCOUNTER — Encounter (HOSPITAL_COMMUNITY): Payer: Self-pay | Admitting: *Deleted

## 2015-08-23 ENCOUNTER — Inpatient Hospital Stay (HOSPITAL_COMMUNITY)
Admission: AD | Admit: 2015-08-23 | Discharge: 2015-08-23 | Disposition: A | Discharge status: Home / Self Care | Payer: Managed Care, Other (non HMO) | Source: Ambulatory Visit | Attending: Obstetrics and Gynecology | Admitting: Obstetrics and Gynecology

## 2015-08-23 DIAGNOSIS — R102 Pelvic and perineal pain: Secondary | ICD-10-CM

## 2015-08-23 DIAGNOSIS — Z113 Encounter for screening for infections with a predominantly sexual mode of transmission: Secondary | ICD-10-CM | POA: Diagnosis present

## 2015-08-23 DIAGNOSIS — Z202 Contact with and (suspected) exposure to infections with a predominantly sexual mode of transmission: Secondary | ICD-10-CM | POA: Diagnosis not present

## 2015-08-23 DIAGNOSIS — N760 Acute vaginitis: Secondary | ICD-10-CM | POA: Diagnosis not present

## 2015-08-23 DIAGNOSIS — B9689 Other specified bacterial agents as the cause of diseases classified elsewhere: Secondary | ICD-10-CM

## 2015-08-23 HISTORY — DX: Chlamydial infection, unspecified: A74.9

## 2015-08-23 LAB — URINALYSIS, ROUTINE W REFLEX MICROSCOPIC
Bilirubin Urine: NEGATIVE
Glucose, UA: NEGATIVE mg/dL
Hgb urine dipstick: NEGATIVE
KETONES UR: NEGATIVE mg/dL
NITRITE: NEGATIVE
PROTEIN: NEGATIVE mg/dL
Specific Gravity, Urine: 1.025 (ref 1.005–1.030)
UROBILINOGEN UA: 0.2 mg/dL (ref 0.0–1.0)
pH: 6.5 (ref 5.0–8.0)

## 2015-08-23 LAB — URINE MICROSCOPIC-ADD ON

## 2015-08-23 LAB — WET PREP, GENITAL
TRICH WET PREP: NONE SEEN
YEAST WET PREP: NONE SEEN

## 2015-08-23 LAB — POCT PREGNANCY, URINE: Preg Test, Ur: NEGATIVE

## 2015-08-23 LAB — HEPATITIS B SURFACE ANTIGEN: Hepatitis B Surface Ag: NEGATIVE

## 2015-08-23 MED ORDER — METRONIDAZOLE 500 MG PO TABS: 500.0000 mg | ORAL_TABLET | Freq: Two times a day (BID) | ORAL | Status: AC

## 2015-08-23 MED ORDER — AZITHROMYCIN 250 MG PO TABS
1000.0000 mg | ORAL_TABLET | Freq: Once | ORAL | Status: AC
  Administered 2015-08-23: 1000 mg via ORAL
  Filled 2015-08-23: qty 4

## 2015-08-23 MED ORDER — CEFTRIAXONE SODIUM 250 MG IJ SOLR
250.0000 mg | Freq: Once | INTRAMUSCULAR | Status: AC
  Administered 2015-08-23: 250 mg via INTRAMUSCULAR
  Filled 2015-08-23: qty 250

## 2015-08-23 NOTE — MAU Provider Note (Signed)
History     CSN: [id number]  Arrival date and time: 08/23/15 1128   First Provider Initiated Contact with Patient 08/23/15 1219      Chief Complaint  Patient presents with  . Dysuria  . Exposure to STD   This is a 19 y.o. female who presents requesting testing and treatment for STDs.   States her ex-boyfriend of 3 years called and told her "she needed to get checked out".  He would not specify what STD he had. She reports urinary frequency, urgency and dysuria. Denies other somatic symptoms.  Exposure to STD  The patient's primary symptoms include dysuria. The patient's pertinent negatives include no discharge, genital itching, genital lesions, genital rash or pelvic pain. This is a new problem. The current episode started in the past 7 days. The problem has been unchanged. The vaginal discharge was normal. Associate symptoms include urinary frequency. Pertinent negatives include no abdominal pain, diaphoresis, fever, genital odor or sore throat. She has tried nothing for the symptoms. Risk factors: Exposure to unknown STD.   RN Note: Received call from ex, told her she needed to get checked out. Having freq, urgency and pain when she urinates.        OB History    Gravida Para Term Preterm AB TAB SAB Ectopic Multiple Living   0      Past Medical History  Diagnosis Date  . Asthma   . Obesity   . Precocious puberty   . Prediabetes   . Diabetes mellitus without complication (HCC)   . Trichomonal vaginitis   . Depression     doing good  . Chlamydia     Past Surgical History  Procedure Laterality Date  . Dilation and curettage of uterus N/A 11/09/2014    Procedure: DILATATION AND CURETTAGE;  Surgeon: Allie Bossier, MD;  Location: [facility];  Service: Gynecology;  Laterality: N/A;  . Dilation and curettage of uterus  2016    Family History  Problem Relation Age of Onset  . Diabetes Mother   . Hypertension Mother   . Obesity Mother   . Fibroids Mother   .  Cancer Mother     thyroid  . Obesity Father   . Obesity Maternal Aunt   . Fibroids Maternal Aunt   . Obesity Maternal Uncle   . Obesity Paternal Aunt   . Diabetes Maternal Grandmother   . Obesity Maternal Grandmother   . Hypertension Maternal Grandmother   . Diabetes Maternal Grandfather   . Obesity Maternal Grandfather   . Stroke Maternal Grandfather   . Heart disease Maternal Grandfather   . Hypertension Maternal Grandfather   . Obesity Paternal Grandfather   . Heart disease Paternal Grandfather   . Bipolar disorder Brother   . Bipolar disorder Brother     Social History  Substance Use Topics  . Smoking status: Never Smoker   . Smokeless tobacco: Never Used  . Alcohol Use: No    Allergies: No Known Allergies  Prescriptions prior to admission  Medication Sig Dispense Refill Last Dose  . naproxen sodium (ALEVE) 220 MG tablet Take 440 mg by mouth 2 (two) times daily as needed (for pain/cramping).   Past Week at Unknown time  . azithromycin (ZITHROMAX) 500 MG tablet Take 2 po now (Patient not taking: Reported on 06/01/2015) 2 tablet 0 Completed Course at Unknown time  . ciprofloxacin (CIPRO) 500 MG tablet Take 1 tablet (500 mg total) by mouth  2 (two) times daily. (Patient not taking: Reported on 06/01/2015) 14 tablet 0 Completed Course at Unknown time  . ketorolac (TORADOL) 10 MG tablet Take 1 tablet (10 mg total) by mouth every 6 (six) hours as needed. (Patient not taking: Reported on 06/01/2015) 20 tablet 0 Completed Course at Unknown time  . loperamide (IMODIUM) 2 MG capsule Take 1 capsule (2 mg total) by mouth 4 (four) times daily as needed for diarrhea or loose stools. (Patient not taking: Reported on 07/25/2015) 12 capsule 0 Completed Course at Unknown time  . naproxen (NAPROSYN) 500 MG tablet Take 1 tablet (500 mg total) by mouth 2 (two) times daily with a meal. (Patient not taking: Reported on 06/01/2015) 30 tablet 1 Completed Course at Unknown time  . norgestimate-ethinyl  estradiol (ORTHO-CYCLEN,SPRINTEC,PREVIFEM) 0.25-35 MG-MCG tablet Take 1 tablet by mouth daily. (Patient not taking: Reported on 06/29/2015) 1 Package 11 Completed Course at Unknown time   Medical, Surgical, Family and Social histories reviewed and are listed above.  Medications and allergies reviewed.   Review of Systems  Constitutional: Negative for fever, chills, malaise/fatigue and diaphoresis.  HENT: Negative for sore throat.   Gastrointestinal: Negative for nausea, vomiting, abdominal pain, diarrhea and constipation.  Genitourinary: Positive for dysuria, urgency and frequency. Negative for flank pain and pelvic pain.  Musculoskeletal: Negative for myalgias.  Neurological: Negative for headaches.   Physical Exam   Blood pressure 131/73, pulse 85, temperature 98.4 F (36.9 C), temperature source Oral, resp. rate 18, height  (1.651 m), weight 255 lb (115.667 kg), last menstrual period 08/08/2015.  Physical Exam  Constitutional: She is oriented to person, place, and time. She appears well-developed and well-nourished. No distress.  HENT:  Head: Normocephalic.  Cardiovascular: Normal rate, regular rhythm and normal heart sounds.  Exam reveals no gallop and no friction rub.   No murmur heard. Respiratory: Effort normal and breath sounds normal. No respiratory distress. She has no wheezes. She has no rales.  GI: Soft. She exhibits no distension. There is tenderness (over bladder). There is no rebound and no guarding.  Genitourinary: Vaginal discharge (thin white) found.  Mild cervical motion tenderness Uterus small, difficulty to feel due to habitus Adnexa nontender bilaterally  Musculoskeletal: Normal range of motion.  Neurological: She is alert and oriented to person, place, and time.  Skin: Skin is warm and dry.  Psychiatric: She has a normal mood and affect.    MAU Course  Procedures  MDM Pelvic cultures and wet prep done HIV, HbSAG, and RPR done  Results for orders  placed or performed during the hospital encounter of 08/23/15 (from the past 72 hour(s))  Urinalysis, Routine w reflex microscopic (not at [facility])     Status: Abnormal   Collection Time: 08/23/15 11:50 AM  Result Value Ref Range   Color, Urine YELLOW YELLOW   APPearance CLOUDY (A) CLEAR   Specific Gravity, Urine 1.025 1.005 - 1.030   pH 6.5 5.0 - 8.0   Glucose, UA NEGATIVE NEGATIVE mg/dL   Hgb urine dipstick NEGATIVE NEGATIVE   Bilirubin Urine NEGATIVE NEGATIVE   Ketones, ur NEGATIVE NEGATIVE mg/dL   Protein, ur NEGATIVE NEGATIVE mg/dL   Urobilinogen, UA 0.2 0.0 - 1.0 mg/dL   Nitrite NEGATIVE NEGATIVE   Leukocytes, UA SMALL (A) NEGATIVE  Urine microscopic-add on     Status: Abnormal   Collection Time: 08/23/15 11:50 AM  Result Value Ref Range   Squamous Epithelial / LPF MANY (A) RARE   WBC, UA 7-10 <3 WBC/hpf  Bacteria, UA FEW (A) RARE   Urine-Other MUCOUS PRESENT   Pregnancy, urine POC     Status: None   Collection Time: 08/23/15 11:55 AM  Result Value Ref Range   Preg Test, Ur NEGATIVE NEGATIVE    Comment:        THE SENSITIVITY OF THIS METHODOLOGY IS >24 mIU/mL   Hepatitis B surface antigen     Status: None   Collection Time: 08/23/15 12:26 PM  Result Value Ref Range   Hepatitis B Surface Ag Negative Negative    Comment: (NOTE) STAT RESULT CALLED TO CAROLYN HAGGINS,LAB 08-23-2015 1606 HRS Performed At: [facility] [address] [place], [place] [id number] Mila HomerHancock William F MD WU:[phone]   Wet prep, genital     Status: Abnormal   Collection Time: 08/23/15  1:04 PM  Result Value Ref Range   Yeast Wet Prep HPF POC NONE SEEN NONE SEEN   Trich, Wet Prep NONE SEEN NONE SEEN   Clue Cells Wet Prep HPF POC MODERATE (A) NONE SEEN   WBC, Wet Prep HPF POC FEW (A) NONE SEEN    Comment: MANY BACTERIA SEEN   Informed we will not get all results back today  Assessment and Plan  A:  Exposure to unknown STD       Bacterial Vaginosis  P;  Discharge home       Rx  Flagyl x 7 days for BV       Safe Sex and STD information given       Advised [facility]ption counseling  [facility]ILL[facility]AMS,MARIE 08/23/2015, 12:20 PM

## 2015-08-23 NOTE — Discharge Instructions (Signed)
Safe Sex °Safe sex is about reducing the risk of giving or getting a sexually transmitted disease (STD). STDs are spread through sexual contact involving the genitals, mouth, or rectum. Some STDs can be cured and others cannot. Safe sex can also prevent unintended pregnancies.  °WHAT ARE SOME SAFE SEX PRACTICES? °· Limit your sexual activity to only one partner who is having sex with only you. °· Talk to your partner about his or her past partners, past STDs, and drug use. °· Use a condom every time you have sexual intercourse. This includes vaginal, oral, and anal sexual activity. Both females and males should wear condoms during oral sex. Only use latex or polyurethane condoms and water-based lubricants. Using petroleum-based lubricants or oils to lubricate a condom will weaken the condom and increase the chance that it will break. The condom should be in place from the beginning to the end of sexual activity. Wearing a condom reduces, but does not completely eliminate, your risk of getting or giving an STD. STDs can be spread by contact with infected body fluids and skin. °· Get vaccinated for hepatitis B and HPV. °· Avoid alcohol and recreational drugs, which can affect your judgment. You may forget to use a condom or participate in high-risk sex. °· For females, avoid douching after sexual intercourse. Douching can spread an infection farther into the reproductive tract. °· Check your body for signs of sores, blisters, rashes, or unusual discharge. See your health care provider if you notice any of these signs. °· Avoid sexual contact if you have symptoms of an infection or are being treated for an STD. If you or your partner has herpes, avoid sexual contact when blisters are present. Use condoms at all other times. °· If you are at risk of being infected with HIV, it is recommended that you take a prescription medicine daily to prevent HIV infection. This is called pre-exposure prophylaxis (PrEP). You are  considered at risk if: °· You are a man who has sex with other men (MSM). °· You are a heterosexual man or woman who is sexually active with more than one partner. °· You take drugs by injection. °· You are sexually active with a partner who has HIV. °· Talk with your health care provider about whether you are at high risk of being infected with HIV. If you choose to begin PrEP, you should first be tested for HIV. You should then be tested every 3 months for as long as you are taking PrEP. °· See your health care provider for regular screenings, exams, and tests for other STDs. Before having sex with a new partner, each of you should be screened for STDs and should talk about the results with each other. °WHAT ARE THE BENEFITS OF SAFE SEX?  °· There is less chance of getting or giving an STD. °· You can prevent unwanted or unintended pregnancies. °· By discussing safe sex concerns with your partner, you may increase feelings of intimacy, comfort, trust, and honesty between the two of you. °  °This information is not intended to replace advice given to you by your health care provider. Make sure you discuss any questions you have with your health care provider. °  °Document Released: 11/29/2004 Document Revised: 11/12/2014 Document Reviewed: 04/14/2012 °Elsevier Interactive Patient Education ©2016 [employer] ° °Sexually Transmitted Disease °A sexually transmitted disease (STD) is a disease or infection that may be passed (transmitted) from person to person, usually during sexual activity. This may happen by   way of saliva, semen, blood, vaginal mucus, or urine. Common STDs include: °· Gonorrhea. °· Chlamydia. °· Syphilis. °· HIV and AIDS. °· Genital herpes. °· Hepatitis B and C. °· Trichomonas. °· Human papillomavirus (HPV). °· Pubic lice. °· Scabies. °· Mites. °· Bacterial vaginosis. °WHAT ARE CAUSES OF STDs? °An STD may be caused by bacteria, a virus, or parasites. STDs are often transmitted during sexual  activity if one person is infected. However, they may also be transmitted through nonsexual means. STDs may be transmitted after:  °· Sexual intercourse with an infected person. °· Sharing sex toys with an infected person. °· Sharing needles with an infected person or using unclean piercing or tattoo needles. °· Having intimate contact with the genitals, mouth, or rectal areas of an infected person. °· Exposure to infected fluids during birth. °WHAT ARE THE SIGNS AND SYMPTOMS OF STDs? °Different STDs have different symptoms. Some people may not have any symptoms. If symptoms are present, they may include: °· Painful or bloody urination. °· Pain in the pelvis, abdomen, vagina, anus, throat, or eyes. °· A skin rash, itching, or irritation. °· Growths, ulcerations, blisters, or sores in the genital and anal areas. °· Abnormal vaginal discharge with or without bad odor. °· Penile discharge in men. °· Fever. °· Pain or bleeding during sexual intercourse. °· Swollen glands in the groin area. °· Yellow skin and eyes (jaundice). This is seen with hepatitis. °· Swollen testicles. °· Infertility. °· Sores and blisters in the mouth. °HOW ARE STDs DIAGNOSED? °To make a diagnosis, your health care provider may: °· Take a medical history. °· Perform a physical exam. °· Take a sample of any discharge to examine. °· Swab the throat, cervix, opening to the penis, rectum, or vagina for testing. °· Test a sample of your first morning urine. °· Perform blood tests. °· Perform a Pap test, if this applies. °· Perform a colposcopy. °· Perform a laparoscopy. °HOW ARE STDs TREATED? °Treatment depends on the STD. Some STDs may be treated but not cured. °· Chlamydia, gonorrhea, trichomonas, and syphilis can be cured with antibiotic medicine. °· Genital herpes, hepatitis, and HIV can be treated, but not cured, with prescribed medicines. The medicines lessen symptoms. °· Genital warts from HPV can be treated with medicine or by freezing,  burning (electrocautery), or surgery. Warts may come back. °· HPV cannot be cured with medicine or surgery. However, abnormal areas may be removed from the cervix, vagina, or vulva. °· If your diagnosis is confirmed, your recent sexual partners need treatment. This is true even if they are symptom-free or have a negative culture or evaluation. They should not have sex until their health care providers say it is okay. °· Your health care provider may test you for infection again 3 months after treatment. °HOW CAN I REDUCE MY RISK OF GETTING AN STD? °Take these steps to reduce your risk of getting an STD: °· Use latex condoms, dental dams, and water-soluble lubricants during sexual activity. Do not use petroleum jelly or oils. °· Avoid having multiple sex partners. °· Do not have sex with someone who has other sex partners °· Do not have sex with anyone you do not know or who is at high risk for an STD. °· Avoid risky sex practices that can break your skin. °· Do not have sex if you have open sores on your mouth or skin. °· Avoid drinking too much alcohol or taking illegal drugs. Alcohol and drugs can affect your judgment and put   you in a vulnerable position. °· Avoid engaging in oral and anal sex acts. °· Get vaccinated for HPV and hepatitis. If you have not received these vaccines in the past, talk to your health care provider about whether one or both might be right for you. °· If you are at risk of being infected with HIV, it is recommended that you take a prescription medicine daily to prevent HIV infection. This is called pre-exposure prophylaxis (PrEP). You are considered at risk if: °¨ You are a man who has sex with other men (MSM). °¨ You are a heterosexual man or woman and are sexually active with more than one partner. °¨ You take drugs by injection. °¨ You are sexually active with a partner who has HIV. °· Talk with your health care provider about whether you are at high risk of being infected with HIV. If  you choose to begin PrEP, you should first be tested for HIV. You should then be tested every 3 months for as long as you are taking PrEP. °WHAT SHOULD I DO IF I THINK I HAVE AN STD? °· See your health care provider. °· Tell your sexual partner(s). They should be tested and treated for any STDs. °· Do not have sex until your health care provider says it is okay. °WHEN SHOULD I GET IMMEDIATE MEDICAL CARE? °Contact your health care provider right away if:  °· You have severe abdominal pain. °· You are a man and notice swelling or pain in your testicles. °· You are a woman and notice swelling or pain in your vagina. °  °This information is not intended to replace advice given to you by your health care provider. Make sure you discuss any questions you have with your health care provider. °  °Document Released: 01/12/2003 Document Revised: 11/12/2014 Document Reviewed: 05/12/2013 °Elsevier Interactive Patient Education ©2016 [employer]vier Inc. ° °

## 2015-08-23 NOTE — MAU Note (Signed)
Received call from ex, told her she needed to get checked out.  Having freq, urgency and pain when she urinates.

## 2015-08-24 LAB — GC/CHLAMYDIA PROBE AMP (~~LOC~~) NOT AT ARMC
CHLAMYDIA, DNA PROBE: POSITIVE — AB
NEISSERIA GONORRHEA: NEGATIVE

## 2015-08-24 LAB — RPR: RPR: NONREACTIVE

## 2015-08-24 LAB — HIV ANTIBODY (ROUTINE TESTING W REFLEX): HIV SCREEN 4TH GENERATION: NONREACTIVE

## 2015-09-03 ENCOUNTER — Encounter (HOSPITAL_COMMUNITY): Payer: Self-pay | Admitting: Emergency Medicine

## 2015-09-03 ENCOUNTER — Emergency Department (HOSPITAL_COMMUNITY)
Admission: EM | Admit: 2015-09-03 | Discharge: 2015-09-03 | Disposition: A | Discharge status: Home / Self Care | Payer: Managed Care, Other (non HMO) | Attending: Emergency Medicine | Admitting: Emergency Medicine

## 2015-09-03 ENCOUNTER — Emergency Department (HOSPITAL_COMMUNITY): Payer: Managed Care, Other (non HMO)

## 2015-09-03 DIAGNOSIS — J45901 Unspecified asthma with (acute) exacerbation: Secondary | ICD-10-CM | POA: Diagnosis not present

## 2015-09-03 DIAGNOSIS — E119 Type 2 diabetes mellitus without complications: Secondary | ICD-10-CM | POA: Diagnosis not present

## 2015-09-03 DIAGNOSIS — J039 Acute tonsillitis, unspecified: Secondary | ICD-10-CM | POA: Insufficient documentation

## 2015-09-03 DIAGNOSIS — E669 Obesity, unspecified: Secondary | ICD-10-CM | POA: Insufficient documentation

## 2015-09-03 DIAGNOSIS — Z8659 Personal history of other mental and behavioral disorders: Secondary | ICD-10-CM | POA: Diagnosis not present

## 2015-09-03 DIAGNOSIS — J029 Acute pharyngitis, unspecified: Secondary | ICD-10-CM | POA: Diagnosis present

## 2015-09-03 DIAGNOSIS — Z8619 Personal history of other infectious and parasitic diseases: Secondary | ICD-10-CM | POA: Insufficient documentation

## 2015-09-03 DIAGNOSIS — R110 Nausea: Secondary | ICD-10-CM | POA: Diagnosis not present

## 2015-09-03 LAB — BASIC METABOLIC PANEL
Anion gap: 6 (ref 5–15)
BUN: 6 mg/dL (ref 6–20)
CO2: 26 mmol/L (ref 22–32)
CREATININE: 0.5 mg/dL (ref 0.44–1.00)
Calcium: 9 mg/dL (ref 8.9–10.3)
Chloride: 107 mmol/L (ref 101–111)
GFR calc Af Amer: >60 mL/min (ref >60)
Glucose, Bld: 87 mg/dL (ref 65–99)
POTASSIUM: 3.5 mmol/L (ref 3.5–5.1)
SODIUM: 139 mmol/L (ref 135–145)

## 2015-09-03 LAB — RAPID STREP SCREEN (MED CTR MEBANE ONLY): STREPTOCOCCUS, GROUP A SCREEN (DIRECT): NEGATIVE

## 2015-09-03 MED ORDER — IOHEXOL 300 MG/ML  SOLN
100.0000 mL | Freq: Once | INTRAMUSCULAR | Status: AC | PRN
  Administered 2015-09-03: 100 mL via INTRAVENOUS

## 2015-09-03 MED ORDER — PENICILLIN G BENZATHINE 1200000 UNIT/2ML IM SUSP
1.2000 10*6.[IU] | Freq: Once | INTRAMUSCULAR | Status: AC
  Administered 2015-09-03: 1.2 10*6.[IU] via INTRAMUSCULAR
  Filled 2015-09-03: qty 2

## 2015-09-03 NOTE — ED Notes (Signed)
Nurse drawing labs. 

## 2015-09-03 NOTE — ED Notes (Signed)
Patient with a sore throat for three days, body aches.  Patient reports no appetite, denies nausea vomiting, diarrhea.

## 2015-09-03 NOTE — ED Provider Notes (Signed)
CSN: [id number]   Arrival date & time 09/03/15 1420  History  By signing my name below, I, Bethel Born, attest that this documentation has been prepared under the direction and in the presence of Melburn Hake, [place]. Electronically Signed: Bethel Born, ED Scribe. 09/03/2015. 2:50 PM. Chief Complaint  Patient presents with  . Sore Throat    HPI The history is provided by the patient. No language interpreter was used.   Jaime Ramos is a 19 y.o. female who presents to the Emergency Department complaining of worsening sore throat with onset 3 days ago. The constant pain started on the left side of the throat and then spread to the right. Swallowing and opening the mouth exacerbate the pain.  Benadryl provided insufficient pain relief at home. Associated symptoms include increasing throat swelling, white spots at the back of the throat, decreased appetite, nausea, left ear pain, voice change, and SOB at night. Pt denies fever, vomiting, nasal congestion, sneezing, rhinorrhea, drooling, cough, wheezing, chest pain, and abdominal pain . Her niece has been ill with a cough.   Past Medical History  Diagnosis Date  . Asthma   . Obesity   . Precocious puberty   . Prediabetes   . Diabetes mellitus without complication (HCC)   . Trichomonal vaginitis   . Depression     doing good  . Chlamydia     Past Surgical History  Procedure Laterality Date  . Dilation and curettage of uterus N/A 11/09/2014    Procedure: DILATATION AND CURETTAGE;  Surgeon: Allie Bossier, MD;  Location: [facility];  Service: Gynecology;  Laterality: N/A;  . Dilation and curettage of uterus  2016    Family History  Problem Relation Age of Onset  . Diabetes Mother   . Hypertension Mother   . Obesity Mother   . Fibroids Mother   . Cancer Mother     thyroid  . Obesity Father   . Obesity Maternal Aunt   . Fibroids Maternal Aunt   . Obesity Maternal Uncle   . Obesity Paternal Aunt   . Diabetes Maternal Grandmother    . Obesity Maternal Grandmother   . Hypertension Maternal Grandmother   . Diabetes Maternal Grandfather   . Obesity Maternal Grandfather   . Stroke Maternal Grandfather   . Heart disease Maternal Grandfather   . Hypertension Maternal Grandfather   . Obesity Paternal Grandfather   . Heart disease Paternal Grandfather   . Bipolar disorder Brother   . Bipolar disorder Brother     Social History  Substance Use Topics  . Smoking status: Never Smoker   . Smokeless tobacco: Never Used  . Alcohol Use: No     Review of Systems  Constitutional: Positive for appetite change. Negative for fever and chills.  HENT: Positive for ear pain (left), sore throat and voice change. Negative for drooling, rhinorrhea, sneezing and trouble swallowing.   Respiratory: Positive for shortness of breath. Negative for cough and wheezing.   Cardiovascular: Negative for chest pain.  Gastrointestinal: Positive for nausea. Negative for abdominal pain and diarrhea.    Home Medications   Prior to Admission medications   Medication Sig Start Date End Date Taking? Authorizing Provider  loperamide (IMODIUM) 2 MG capsule Take 1 capsule (2 mg total) by mouth 4 (four) times daily as needed for diarrhea or loose stools. Patient not taking: Reported on 07/25/2015 06/30/15   Earley Favor, NP  naproxen sodium (ALEVE) 220 MG tablet Take 440 mg by mouth 2 (two) times daily  as needed (for pain/cramping).    Historical Provider, MD  norgestimate-ethinyl estradiol (ORTHO-CYCLEN,SPRINTEC,PREVIFEM) 0.25-35 MG-MCG tablet Take 1 tablet by mouth daily. Patient not taking: Reported on 06/29/2015 02/09/15   Jacklyn ShellFrances Cresenzo-Dishmon, CNM    Allergies  Review of patient's allergies indicates no known allergies.  Triage Vitals: BP 108/71 mmHg  Pulse 89  Temp(Src) 98.9 F (37.2 C) (Oral)  Resp 16  Wt 250 lb (113.399 kg)  SpO2 99%  LMP 08/08/2015  Physical Exam  Constitutional: She is oriented to person, place, and time. She appears  well-developed and well-nourished.  HENT:  Head: Normocephalic.  Mouth/Throat: Uvula is midline and mucous membranes are normal. No trismus in the jaw. Oropharyngeal exudate and posterior oropharyngeal erythema present.  Mild swelling and erythema noted to bilateral peritonsillar regions with white tonsillar exudate bilaterally.  TMs normal bilaterally.   Eyes: EOM are normal.  Neck: Normal range of motion.  Cardiovascular: Normal rate, regular rhythm and normal heart sounds.   Pulmonary/Chest: Effort normal and breath sounds normal. No respiratory distress. She has no wheezes. She has no rales.  Abdominal: Soft. Bowel sounds are normal. She exhibits no distension and no mass. There is no tenderness. There is no rebound and no guarding.  Musculoskeletal: Normal range of motion.  Neurological: She is alert and oriented to person, place, and time.  Psychiatric: She has a normal mood and affect.  Nursing note and vitals reviewed.   ED Course  Procedures  DIAGNOSTIC STUDIES: Oxygen Saturation is 99% on RA,  normal by my interpretation.    COORDINATION OF CARE: 2:46 PM Discussed treatment plan which includes strep screen, CT neck,  with pt at bedside and pt agreed to the plan.  Labs Review-  Labs Reviewed  RAPID STREP SCREEN (NOT AT [facility])  CULTURE, GROUP A STREP  BASIC METABOLIC PANEL    Imaging Review Ct Soft Tissue Neck W Contrast  09/03/2015  CLINICAL DATA:  Sore throat for several days. EXAM: CT NECK WITH CONTRAST TECHNIQUE: Multidetector CT imaging of the neck was performed using the standard protocol following the bolus administration of intravenous contrast. CONTRAST:  100mL OMNIPAQUE IOHEXOL 300 MG/ML  SOLN COMPARISON:  Chest CT 07/26/2015 FINDINGS: Pharynx and larynx: There is mild swelling of the lingual tonsils, LEFT greater than RIGHT (image 39, series 3). There is no evidence of abscess formation or abnormal fluid collections. The parapharyngeal fat planes are normal. The  epiglottis and glottis are normal. No fluid in the retropharyngeal space. Salivary glands: Normal salivary glands Thyroid: Normal Lymph nodes: Scattered level 2 lymph nodes are upper limits of normal at 10 mm. Vascular: Carotid sheaths are normal. Limited intracranial: Limited view of the inferior brain is unremarkable. Visualized orbits: Normal Mastoids and visualized paranasal sinuses: No fluid in the mastoid air cells. Paranasal sinuses are clear. Skeleton: No aggressive osseous lesion. Upper chest: Lung apices are clear IMPRESSION: 1. Mild thickening and edema of the tonsils without evidence of abscess formation. Findings consistent with mild tonsillitis / pharyngitis. 2. Mild reactive adenopathy in level IIa nodal stations. 3. Glottis and epiglottis are normal.  No prevertebral fluid. Electronically Signed   By: Genevive BiStewart  Edmunds M.D.   On: 09/03/2015 16:28    MDM   Final diagnoses:  Acute tonsillitis, unspecified etiology  Pharyngitis   Patient presents to the ED with worsening sore throat, body aches, voice change, difficulty breathing. VSS. Exam revealed erythema and mild swelling to bilateral peritonsillar regions with white exudate. No trismus, drooling, neck swelling or stridor on  exam. Strep negative. CT neck ordered to evaluate for PTA. CT soft tissue neck revealed tonsillitis/pharyngitis, no PTA. Patient given IM penicillin in the ED to treat prophylactically for strep pharyngitis. Plan to discharge patient home and advised patient to take ibuprofen as needed for pain relief. Advised to follow-up with her PCP.  Evaluation does not show pathology requring ongoing emergent intervention or admission. Pt is hemodynamically stable and mentating appropriately. Discussed findings/results and plan with patient/guardian, who agrees with plan. All questions answered. Return precautions discussed and outpatient follow up given.    I personally performed the services described in this documentation,  which was scribed in my presence. The recorded information has been reviewed and is accurate.      Satira Sark [place], PA-C 09/04/15 1008  Samuel Jester, DO 09/07/15 [phone]

## 2015-09-03 NOTE — Discharge Instructions (Signed)
You may continue taking ibuprofen as prescribed over-the-counter for pain relief. Follow-up with a primary care provider listed in the resource guide provided below in the next week. Return to the emergency department if symptoms worsen or new onset of fever, drooling, difficulty opening jaw completely, neck swelling, difficulty breathing.    Emergency Department Resource Guide 1) Find a Doctor and Pay Out of Pocket Although you won't have to find out who is covered by your insurance plan, it is a good idea to ask around and get recommendations. You will then need to call the office and see if the doctor you have chosen will accept you as a new patient and what types of options they offer for patients who are self-pay. Some doctors offer discounts or will set up payment plans for their patients who do not have insurance, but you will need to ask so you aren't surprised when you get to your appointment.  2) Contact Your Local Health Department Not all health departments have doctors that can see patients for sick visits, but many do, so it is worth a call to see if yours does. If you don't know where your local health department is, you can check in your phone book. The CDC also has a tool to help you locate your state's health department, and many state websites also have listings of all of their local health departments.  3) Find a Walk-in Clinic If your illness is not likely to be very severe or complicated, you may want to try a walk in clinic. These are popping up all over the country in pharmacies, drugstores, and shopping centers. They're usually staffed by nurse practitioners or physician assistants that have been trained to treat common illnesses and complaints. They're usually fairly quick and inexpensive. However, if you have serious medical issues or chronic medical problems, these are probably not your best option.  No Primary Care Doctor: - Call Health Connect at  [phone] - they can  help you locate a primary care doctor that  accepts your insurance, provides certain services, etc. - Physician Referral Service- [phone]  Chronic Pain Problems: Organization         Address  Phone   Notes  [facility]  [phone] Patients need to be referred by their primary care doctor.   Medication Assistance: Organization         Address  Phone   Notes  [facility] Medication [facility] [address] [place] [place], [place] [address] [phone] --Must be a resident of [facility] -- Must have NO insurance coverage whatsoever (no Medicaid/ Medicare, etc.) -- The pt. MUST have a primary care doctor that directs their care regularly and follows them in the community   Med[facility]ist  [phone]   [employer]  [phone]    Agencies that provide inexpensive medical care: Organization         Address  Phone   Notes  [facility]  [phone]   [facility]    [phone]   [facility] [address] [place], [place] [address] [phone]   [facility] [address] [place]. [address] [place] [phone]   Planned Parenthood    [phone]   [facility]    [phone]   Community Health and [facility]  [address], [place] Phone:  [phone], Fax:  [phone] Hours of Operation:  9 am - 6 pm, M-F.  Also accepts Medicaid/Medicare and self-pay.  [facility]  [address], [place] Phone: [phone](336) [phone], Fax: [phone]. Hours of Operation:  8:30 am - 5:30 pm, M-F.  Also accepts Medicaid and self-pay.  [facility] [place] [address], [place] Phone: [phone](336) [phone]   [facility] [address], [place], [place] [phone], Ext. [phone] Mondays & Thursdays: 7-9 AM.  First 15 patients are seen on a first come, first serve basis.    Medicaid-accepting [facility] Providers:  Organization         Address  Phone   Notes  [facility] [address], [place] [phone] Also accepts self-pay patients.  [facility] [address], [place]  [phone]) [phone]   [facility] [address], [place] [phone]) [phone]   [facility] [address] [place] [address], [place] [phone]   Renaye RakersVeita Bland [address], [place]   [phone] Only accepts [place] Access [place] patients after they have their name applied to their card.   Self-Pay (no insurance) in [facility]:  Organization         Address  Phone   Notes  Sickle Cell Patients, [facility] [address], [place] [phone]) [phone]   [facility] [address], [place] [phone]   [facility]  [address], [place] [phone](336) [phone]   [facility]/Dr. Osei-Bonsu  [address] [place] [address], [place] or [phone], [place] [phone]) [phone] Phone number for both [place] and [place] locations is the same.  [facility] [address], [place] [phone]) [phone]   [facility] [address] [place] [address], [place] or [address] [phone]   [facility] [address], [place] [phone]) [phone], phone; [phone]) [phone], fax Sees patients 1st and 3rd Saturday of every month.  Must not qualify for public or private insurance (i.e. Medicaid, Medicare, [place] Health Choice, Veterans' Benefits)  Household income should be no more than 200% of the poverty level The clinic cannot treat you if you are pregnant or think you are pregnant  Sexually transmitted diseases are not treated at the clinic.    Dental Care: Organization         Address  Phone  Notes  [facility] of [facility]  [facility] [address], [place] [phone]) [phone] Accepts children up to age 19 who are enrolled in [place] or [place] Health Choice; pregnant women with a Medicaid card; and children who have applied for Medicaid or [place] Health Choice, but were declined, whose parents can pay a reduced fee at time of service.  [facility] [place]  [address], [place] [phone]) [phone] Accepts children up to age 19 who are enrolled in [place] or  Health Choice; pregnant women with a Medicaid card; and children who have applied for Medicaid or  Health Choice, but were declined, whose parents can pay a reduced fee at time of service.  [facility]  [address], [place] [phone]) [phone] Patients are seen by appointment only. Walk-ins are not accepted. [facility] will see patients 18 years  of age and older. Monday - Tuesday (8am-5pm) Most Wednesdays (8:30-5pm) $30 per visit, cash only  [facility]  [address], [facility] [phone] Patients are seen by appointment only. Walk-ins are not accepted. [facility] will see patients 19 years of age and older. One Wednesday Evening (Monthly: Volunteer Based).  $30 per visit, cash only  [facility]  [phone]) [phone] for adults; Children under age 154, call Graduate Pediatric Dentistry at [phone]) [phone]. Children aged 534-14, please call [phone]) [phone] to request a pediatric application.  Dental services are provided in all areas of dental care including fillings, crowns and bridges, complete and partial dentures, implants, gum treatment, root canals, and extractions. Preventive care is also provided. Treatment is provided to both adults and children. Patients are selected via a lottery and there is often a waiting list.   [facility] [address], [place]  [phone]) [phone] [email or web address]   [facility] [address], [place], [place] [phone], Ext. [phone] Second and Fourth Thursday of each month, opens at 6:30 AM; Clinic ends at 9 AM.  Patients are seen on a first-come first-served basis, and a limited number are seen during each clinic.   [facility]  [address], [place], [place] [phone]) [phone]   Eligibility Requirements You must have lived in [place], [place], or [place] counties for at least the last three months.   You cannot be eligible for state or federal sponsored National Cityhealthcare insurance, including CIGNAVeterans Administration, [place]icaid, or [employer]Medicare.   You generally cannot be eligible for healthcare insurance through your employer.    How to apply: Eligibility screenings are held every Tuesday and Wednesday afternoon from 1:00 pm until 4:00 pm. You do not need an appointment for the interview!  [facility] [address], [place], [place] [phone]   [facility]  [phone]   [facility]  [phone]   [facility]  [phone]    Behavioral Health Resources in the Community: Intensive Outpatient Programs Organization         Address  Phone  Notes  [facility] [address], [place], [place] [phone]   [facility] [address], [place], [place] [phone]   [facility] [address], [place], [place]  [phone]   [facility] [address],  [place], [place] [phone] or [phone]   Substance Abuse Resources Organization         Address  Phone  Notes  [facility]  [phone]   [facility]  [phone]   [facility]  [phone]   [facility]  [phone]   Residential & Outpatient Substance Abuse Program  [phone]   Psychological Services Organization         Address  Phone  Notes  [facility]  [phone]-  [phone]   [facility]  [phone]- [phone]   [facility] [address], [place] [phone] or [phone]    Mobile Crisis Teams Organization         Address  Phone  Notes  [facility], Mobile Crisis Care Unit  [phone]   [facility]  [address] [place], [place] [phone]   [facility] [address] [place] [phone]    Self-Help/Support Groups Organization  Address  Phone             Notes  [facility] [place] - variety of support groups  [phone]- [phone] Call for more information  Narcotics Anonymous (NA), [facility] [address], [employer] [place]  2 meetings at this location   Statistician         Address  Phone  Notes  [facility] [address],    [place]  [phone]   [facility]  [address], [place] [address], [place], [place] [phone]   [facility] [address] [place], [place] [phone] Admissions: 8am-3pm M-F  [facility] [address],    [place], [place] [phone]   [facility] [address] [place], [place], [place] [phone]   [facility] [address]  [place], [place] [phone]   [facility] - Intensive Outpatient [address], [place], [place] [phone]   [facility] [address]  [place], [place] [phone] or [phone]   [facility] ([facility]) [address] [place], [place] [phone] Accepts Medicaid  [facility] [place] [address],  [place] [phone] Substance Abuse/Addiction Treatment   [facility] Organization         Address  Phone  Notes  [facility]  [phone]   Angie Fava, PhD [address] [place], [place]   [phone] or  [phone]   [facility]   [address] [place], [place] [phone]   [facility] [address] [place], [place] [phone] Insurance/Medicaid/sponsorship through [facility] [address], [address]                                    [place], [place] [phone] Therapy/tele-psych/case  [facility] [address], [place] [phone]    Dr. Lolly Mustache  [phone]   [facility]  [employer] [facility] Dept. 1) [address], [place] 2) [address], [place] 3)  [address], [place] [phone]  [phone]   [facility] Child Abuse Hotline [phone] or [phone] (After Hours)

## 2015-09-05 LAB — CULTURE, GROUP A STREP: STREP A CULTURE: NEGATIVE

## 2015-10-06 ENCOUNTER — Emergency Department (HOSPITAL_COMMUNITY)
Admission: EM | Admit: 2015-10-06 | Discharge: 2015-10-07 | Disposition: A | Discharge status: Home / Self Care | Payer: Managed Care, Other (non HMO) | Attending: Emergency Medicine | Admitting: Emergency Medicine

## 2015-10-06 ENCOUNTER — Encounter (HOSPITAL_COMMUNITY): Payer: Self-pay | Admitting: Emergency Medicine

## 2015-10-06 DIAGNOSIS — R1031 Right lower quadrant pain: Secondary | ICD-10-CM | POA: Insufficient documentation

## 2015-10-06 DIAGNOSIS — E669 Obesity, unspecified: Secondary | ICD-10-CM | POA: Insufficient documentation

## 2015-10-06 DIAGNOSIS — R197 Diarrhea, unspecified: Secondary | ICD-10-CM | POA: Insufficient documentation

## 2015-10-06 DIAGNOSIS — R3915 Urgency of urination: Secondary | ICD-10-CM | POA: Diagnosis not present

## 2015-10-06 DIAGNOSIS — Z3202 Encounter for pregnancy test, result negative: Secondary | ICD-10-CM | POA: Insufficient documentation

## 2015-10-06 DIAGNOSIS — Z8659 Personal history of other mental and behavioral disorders: Secondary | ICD-10-CM | POA: Diagnosis not present

## 2015-10-06 DIAGNOSIS — R300 Dysuria: Secondary | ICD-10-CM | POA: Insufficient documentation

## 2015-10-06 DIAGNOSIS — E119 Type 2 diabetes mellitus without complications: Secondary | ICD-10-CM | POA: Diagnosis not present

## 2015-10-06 DIAGNOSIS — J45909 Unspecified asthma, uncomplicated: Secondary | ICD-10-CM | POA: Diagnosis not present

## 2015-10-06 DIAGNOSIS — Z8619 Personal history of other infectious and parasitic diseases: Secondary | ICD-10-CM | POA: Diagnosis not present

## 2015-10-06 DIAGNOSIS — R112 Nausea with vomiting, unspecified: Secondary | ICD-10-CM | POA: Diagnosis not present

## 2015-10-06 DIAGNOSIS — R630 Anorexia: Secondary | ICD-10-CM | POA: Insufficient documentation

## 2015-10-06 LAB — COMPREHENSIVE METABOLIC PANEL
ALT: 16 U/L (ref 14–54)
ANION GAP: 5 (ref 5–15)
AST: 19 U/L (ref 15–41)
Albumin: 3.3 g/dL — ABNORMAL LOW (ref 3.5–5.0)
Alkaline Phosphatase: 86 U/L (ref 38–126)
BUN: 7 mg/dL (ref 6–20)
CHLORIDE: 106 mmol/L (ref 101–111)
CO2: 28 mmol/L (ref 22–32)
Calcium: 9.1 mg/dL (ref 8.9–10.3)
Creatinine, Ser: 0.54 mg/dL (ref 0.44–1.00)
Glucose, Bld: 118 mg/dL — ABNORMAL HIGH (ref 65–99)
POTASSIUM: 3.8 mmol/L (ref 3.5–5.1)
Sodium: 139 mmol/L (ref 135–145)
Total Bilirubin: 0.5 mg/dL (ref 0.3–1.2)
Total Protein: 6.8 g/dL (ref 6.5–8.1)

## 2015-10-06 LAB — CBC
HEMATOCRIT: 36.4 % (ref 36.0–46.0)
HEMOGLOBIN: 10.9 g/dL — AB (ref 12.0–15.0)
MCH: 23.3 pg — ABNORMAL LOW (ref 26.0–34.0)
MCHC: 29.9 g/dL — ABNORMAL LOW (ref 30.0–36.0)
MCV: 77.8 fL — AB (ref 78.0–100.0)
Platelets: 411 10*3/uL — ABNORMAL HIGH (ref 150–400)
RBC: 4.68 MIL/uL (ref 3.87–5.11)
RDW: 15.6 % — ABNORMAL HIGH (ref 11.5–15.5)
WBC: 11.5 10*3/uL — AB (ref 4.0–10.5)

## 2015-10-06 LAB — URINALYSIS, ROUTINE W REFLEX MICROSCOPIC
Bilirubin Urine: NEGATIVE
GLUCOSE, UA: NEGATIVE mg/dL
Hgb urine dipstick: NEGATIVE
Ketones, ur: NEGATIVE mg/dL
LEUKOCYTES UA: NEGATIVE
Nitrite: NEGATIVE
PH: 7.5 (ref 5.0–8.0)
Protein, ur: NEGATIVE mg/dL
Specific Gravity, Urine: 1.028 (ref 1.005–1.030)

## 2015-10-06 LAB — POC URINE PREG, ED: Preg Test, Ur: NEGATIVE

## 2015-10-06 LAB — LIPASE, BLOOD: LIPASE: 33 U/L (ref 11–51)

## 2015-10-06 NOTE — ED Notes (Signed)
Pt. reports emesis , diarrhea , poor appetite and low abdominal pain onset this morning with generalized body aches and fatigue . Denies fever or chills.

## 2015-10-07 ENCOUNTER — Emergency Department (HOSPITAL_COMMUNITY): Payer: Managed Care, Other (non HMO)

## 2015-10-07 MED ORDER — ONDANSETRON 4 MG PO TBDP: 4.0000 mg | ORAL_TABLET | Freq: Three times a day (TID) | ORAL | Status: DC | PRN

## 2015-10-07 MED ORDER — DICYCLOMINE HCL 10 MG PO CAPS
20.0000 mg | ORAL_CAPSULE | Freq: Once | ORAL | Status: AC
  Administered 2015-10-07: 20 mg via ORAL
  Filled 2015-10-07: qty 2

## 2015-10-07 MED ORDER — ONDANSETRON 4 MG PO TBDP
4.0000 mg | ORAL_TABLET | Freq: Once | ORAL | Status: AC
  Administered 2015-10-07: 4 mg via ORAL
  Filled 2015-10-07: qty 1

## 2015-10-07 MED ORDER — IOHEXOL 300 MG/ML  SOLN
100.0000 mL | Freq: Once | INTRAMUSCULAR | Status: AC | PRN
  Administered 2015-10-07: 100 mL via INTRAVENOUS

## 2015-10-07 MED ORDER — SODIUM CHLORIDE 0.9 % IV BOLUS (SEPSIS)
1000.0000 mL | Freq: Once | INTRAVENOUS | Status: AC
  Administered 2015-10-07: 1000 mL via INTRAVENOUS

## 2015-10-07 NOTE — ED Provider Notes (Signed)
CSN: [id number]   Arrival date & time 10/06/15 2239  History  By signing my name below, I, Bethel Born, attest that this documentation has been prepared under the direction and in the presence of Tomasita Crumble, MD. Electronically Signed: Bethel Born, ED Scribe. 10/07/2015. 12:21 AM.  Chief Complaint  Patient presents with  . Abdominal Pain    HPI The history is provided by the patient. No language interpreter was used.   Jaime Ramos is a 19 y.o. female who presents to the Emergency Department complaining of new, constant, cramping, lower right abdominal pain with onset this morning. Pt took nothing for pain at home. The bumps on the drive to the ED did not exacerbate her symptoms. Associated symptoms include nausea, vomiting, diarrhea, decreased appetite, increased urinary urgency, and dysuria. Pt denies fever, chills, and hematuria.    Past Medical History  Diagnosis Date  . Asthma   . Obesity   . Precocious puberty   . Prediabetes   . Diabetes mellitus without complication (HCC)   . Trichomonal vaginitis   . Depression     doing good  . Chlamydia     Past Surgical History  Procedure Laterality Date  . Dilation and curettage of uterus N/A 11/09/2014    Procedure: DILATATION AND CURETTAGE;  Surgeon: Allie Bossier, MD;  Location: [facility];  Service: Gynecology;  Laterality: N/A;  . Dilation and curettage of uterus  2016    Family History  Problem Relation Age of Onset  . Diabetes Mother   . Hypertension Mother   . Obesity Mother   . Fibroids Mother   . Cancer Mother     thyroid  . Obesity Father   . Obesity Maternal Aunt   . Fibroids Maternal Aunt   . Obesity Maternal Uncle   . Obesity Paternal Aunt   . Diabetes Maternal Grandmother   . Obesity Maternal Grandmother   . Hypertension Maternal Grandmother   . Diabetes Maternal Grandfather   . Obesity Maternal Grandfather   . Stroke Maternal Grandfather   . Heart disease Maternal Grandfather   . Hypertension  Maternal Grandfather   . Obesity Paternal Grandfather   . Heart disease Paternal Grandfather   . Bipolar disorder Brother   . Bipolar disorder Brother     Social History  Substance Use Topics  . Smoking status: Never Smoker   . Smokeless tobacco: Never Used  . Alcohol Use: No     Review of Systems 10 Systems reviewed and all are negative for acute change except as noted in the HPI. Home Medications   Prior to Admission medications   Medication Sig Start Date End Date Taking? Authorizing Provider  loperamide (IMODIUM) 2 MG capsule Take 1 capsule (2 mg total) by mouth 4 (four) times daily as needed for diarrhea or loose stools. Patient not taking: Reported on 07/25/2015 06/30/15   Earley Favor, NP  norgestimate-ethinyl estradiol (ORTHO-CYCLEN,SPRINTEC,PREVIFEM) 0.25-35 MG-MCG tablet Take 1 tablet by mouth daily. Patient not taking: Reported on 06/29/2015 02/09/15   Jacklyn Shell, CNM    Allergies  Review of patient's allergies indicates no known allergies.  Triage Vitals: BP 130/72 mmHg  Pulse 90  Temp(Src) 98.5 F (36.9 C) (Oral)  Resp 17  Ht  (1.676 m)  Wt 260 lb (117.935 kg)  BMI 41.99 kg/m2  SpO2 99%  LMP 09/12/2015 (Approximate)  Physical Exam  Constitutional: She is oriented to person, place, and time. She appears well-developed and well-nourished. No distress.  HENT:  Head: Normocephalic  and atraumatic.  Nose: Nose normal.  Mouth/Throat: Oropharynx is clear and moist. No oropharyngeal exudate.  Eyes: Conjunctivae and EOM are normal. Pupils are equal, round, and reactive to light. No scleral icterus.  Neck: Normal range of motion. Neck supple. No JVD present. No tracheal deviation present. No thyromegaly present.  Cardiovascular: Normal rate, regular rhythm and normal heart sounds.  Exam reveals no gallop and no friction rub.   No murmur heard. Pulmonary/Chest: Effort normal and breath sounds normal. No respiratory distress. She has no wheezes. She  exhibits no tenderness.  Abdominal: Soft. Bowel sounds are normal. She exhibits no distension and no mass. There is tenderness in the right lower quadrant. There is no rebound and no guarding.  +Psoas sign  Musculoskeletal: Normal range of motion. She exhibits no edema or tenderness.  Lymphadenopathy:    She has no cervical adenopathy.  Neurological: She is alert and oriented to person, place, and time. No cranial nerve deficit. She exhibits normal muscle tone.  Skin: Skin is warm and dry. No rash noted. No erythema. No pallor.  Nursing note and vitals reviewed.   ED Course  Procedures   DIAGNOSTIC STUDIES: Oxygen Saturation is 99% on RA, normal by my interpretation.    COORDINATION OF CARE: 12:17 AM Discussed treatment plan which includes lab work and CT A/P with pt at bedside and pt agreed to plan.  Labs Reviewed  COMPREHENSIVE METABOLIC PANEL - Abnormal; Notable for the following:    Glucose, Bld 118 (*)    Albumin 3.3 (*)    All other components within normal limits  CBC - Abnormal; Notable for the following:    WBC 11.5 (*)    Hemoglobin 10.9 (*)    MCV 77.8 (*)    MCH 23.3 (*)    MCHC 29.9 (*)    RDW 15.6 (*)    Platelets 411 (*)    All other components within normal limits  URINALYSIS, ROUTINE W REFLEX MICROSCOPIC (NOT AT [facility]) - Abnormal; Notable for the following:    APPearance CLOUDY (*)    All other components within normal limits  LIPASE, BLOOD  POC URINE PREG, ED    Imaging Review Ct Abdomen Pelvis W Contrast  10/07/2015  CLINICAL DATA:  19 year old female with nausea no vomiting. Abdominal pain and Diarrhea. EXAM: CT ABDOMEN AND PELVIS WITH CONTRAST TECHNIQUE: Multidetector CT imaging of the abdomen and pelvis was performed using the standard protocol following bolus administration of intravenous contrast. CONTRAST:  OMNIPAQUE IOHEXOL 300 MG/ML  SOLN COMPARISON:  CT dated 11/19/2014 FINDINGS: The visualized lung bases are clear. No intra-abdominal free  air or free fluid. The liver, gallbladder, pancreas, spleen, and adrenal glands appear unremarkable. Subcentimeter left hepatic hypodensity is too small to characterize but may represent a cyst or hemangioma. Subcentimeter right renal hypodense lesion is not well characterized but likely represents a cyst. There is no hydronephrosis on either side. The visualized ureters and urinary bladder appear grossly unremarkable. The uterus and the adnexa appear unremarkable. Multiple non dilated fluid-filled loops of small bowel noted. There is diffuse mesenteric stranding of these loops of small bowel. Clinical correlation is recommended to evaluate for enteritis. No evidence of bowel obstruction. Small scattered mesenteric and retroperitoneal lymph nodes noted, nonspecific. The appendix is unremarkable. The visualized abdominal aorta and IVC appear patent. No portal venous gas identified. There is mild haziness of the mesentery wound and fat planes surrounding the celiac axis and SMA, nonspecific. The abdominal wall soft tissues as well as  the osseous structures appear unremarkable. IMPRESSION: Nondistended fluid containing loops of small bowel with nonspecific mesentery stranding and small scattered mesentery lymph nodes. Findings may represent enteritis versus mesenteric adenitis. Clinical correlation is recommended. No evidence of bowel obstruction. Normal appendix. Electronically Signed   By: Elgie CollardArash  Radparvar M.D.   On: 10/07/2015 01:53    I personally reviewed and evaluated these images and lab results as a part of my medical decision-making.   MDM   Final diagnoses:  None   Patient presents to the ED for abdominal pain, concerning for appendicitis.  Gastroenteritis is also possible.  She was given morphine and zofran.  CT scan pending.  Upon repeat evaluation, patient feels better. CT scan is negative for appendicitis. She appears well-developed acute distress, vital signs were within her normal limits  and she is safe for discharge.    I personally performed the services described in this documentation, which was scribed in my presence. The recorded information has been reviewed and is accurate.      Tomasita CrumbleAdeleke Alexanderia Gorby, MD 10/07/15 0200

## 2015-10-07 NOTE — Discharge Instructions (Signed)
Abdominal Pain, Adult Jaime Ramos, your CT scan does not show appendicitis.  Take zofran as needed for vomiting and see a primary care doctor within 3 days for close follow up. If symptoms worsen, come back to the ED immediately.  Thank you. Many things can cause belly (abdominal) pain. Most times, the belly pain is not dangerous. Many cases of belly pain can be watched and treated at home. HOME CARE   Do not take medicines that help you go poop (laxatives) unless told to by your doctor.  Only take medicine as told by your doctor.  Eat or drink as told by your doctor. Your doctor will tell you if you should be on a special diet. GET HELP IF:  You do not know what is causing your belly pain.  You have belly pain while you are sick to your stomach (nauseous) or have runny poop (diarrhea).  You have pain while you pee or poop.  Your belly pain wakes you up at night.  You have belly pain that gets worse or better when you eat.  You have belly pain that gets worse when you eat fatty foods.  You have a fever. GET HELP RIGHT AWAY IF:   The pain does not go away within 2 hours.  You keep throwing up (vomiting).  The pain changes and is only in the right or left part of the belly.  You have bloody or tarry looking poop. MAKE SURE YOU:   Understand these instructions.  Will watch your condition.  Will get help right away if you are not doing well or get worse.   This information is not intended to replace advice given to you by your health care provider. Make sure you discuss any questions you have with your health care provider.   Document Released: 04/09/2008 Document Revised: 11/12/2014 Document Reviewed: 07/01/2013 Elsevier Interactive Patient Education [employer]2016 [employer] Inc.

## 2015-10-12 ENCOUNTER — Emergency Department (HOSPITAL_COMMUNITY)
Admission: EM | Admit: 2015-10-12 | Discharge: 2015-10-12 | Disposition: A | Discharge status: Home / Self Care | Payer: Managed Care, Other (non HMO) | Attending: Emergency Medicine | Admitting: Emergency Medicine

## 2015-10-12 ENCOUNTER — Encounter (HOSPITAL_COMMUNITY): Payer: Self-pay | Admitting: Emergency Medicine

## 2015-10-12 DIAGNOSIS — E119 Type 2 diabetes mellitus without complications: Secondary | ICD-10-CM | POA: Insufficient documentation

## 2015-10-12 DIAGNOSIS — E669 Obesity, unspecified: Secondary | ICD-10-CM | POA: Diagnosis not present

## 2015-10-12 DIAGNOSIS — Z8619 Personal history of other infectious and parasitic diseases: Secondary | ICD-10-CM | POA: Insufficient documentation

## 2015-10-12 DIAGNOSIS — Z202 Contact with and (suspected) exposure to infections with a predominantly sexual mode of transmission: Secondary | ICD-10-CM | POA: Diagnosis present

## 2015-10-12 DIAGNOSIS — Z3202 Encounter for pregnancy test, result negative: Secondary | ICD-10-CM | POA: Insufficient documentation

## 2015-10-12 DIAGNOSIS — J45909 Unspecified asthma, uncomplicated: Secondary | ICD-10-CM | POA: Insufficient documentation

## 2015-10-12 DIAGNOSIS — R197 Diarrhea, unspecified: Secondary | ICD-10-CM | POA: Insufficient documentation

## 2015-10-12 DIAGNOSIS — Z8659 Personal history of other mental and behavioral disorders: Secondary | ICD-10-CM | POA: Insufficient documentation

## 2015-10-12 DIAGNOSIS — N730 Acute parametritis and pelvic cellulitis: Secondary | ICD-10-CM | POA: Diagnosis not present

## 2015-10-12 LAB — WET PREP, GENITAL
SPERM: NONE SEEN
Trich, Wet Prep: NONE SEEN
Yeast Wet Prep HPF POC: NONE SEEN

## 2015-10-12 LAB — URINALYSIS, ROUTINE W REFLEX MICROSCOPIC
Bilirubin Urine: NEGATIVE
GLUCOSE, UA: NEGATIVE mg/dL
HGB URINE DIPSTICK: NEGATIVE
KETONES UR: NEGATIVE mg/dL
Leukocytes, UA: NEGATIVE
Nitrite: POSITIVE — AB
PH: 6 (ref 5.0–8.0)
PROTEIN: NEGATIVE mg/dL
Specific Gravity, Urine: 1.034 — ABNORMAL HIGH (ref 1.005–1.030)

## 2015-10-12 LAB — URINE MICROSCOPIC-ADD ON: RBC / HPF: NONE SEEN RBC/hpf (ref 0–5)

## 2015-10-12 LAB — POC URINE PREG, ED: Preg Test, Ur: NEGATIVE

## 2015-10-12 MED ORDER — NAPROXEN 500 MG PO TABS
500.0000 mg | ORAL_TABLET | Freq: Once | ORAL | Status: AC
  Administered 2015-10-12: 500 mg via ORAL
  Filled 2015-10-12: qty 1

## 2015-10-12 MED ORDER — CEFTRIAXONE SODIUM 250 MG IJ SOLR
250.0000 mg | Freq: Once | INTRAMUSCULAR | Status: AC
  Administered 2015-10-12: 250 mg via INTRAMUSCULAR
  Filled 2015-10-12: qty 250

## 2015-10-12 MED ORDER — NAPROXEN 500 MG PO TABS: 500.0000 mg | ORAL_TABLET | Freq: Two times a day (BID) | ORAL | Status: DC

## 2015-10-12 MED ORDER — LIDOCAINE HCL 2 % IJ SOLN
INTRAMUSCULAR | Status: AC
Start: 2015-10-12 — End: 2015-10-12
  Administered 2015-10-12: 400 mg
  Filled 2015-10-12: qty 20

## 2015-10-12 MED ORDER — DOXYCYCLINE HYCLATE 100 MG PO CAPS: 100.0000 mg | ORAL_CAPSULE | Freq: Two times a day (BID) | ORAL | Status: DC

## 2015-10-12 NOTE — ED Notes (Signed)
Pt states while she was at work tonight she got a phone call stating that her partner tested positive for chlamydia  Pt states she needs to be tested  Pt states she is having a hard time holding her urine but denies any vaginal discharge or odor

## 2015-10-12 NOTE — Discharge Instructions (Signed)
Pelvic Inflammatory Disease °Pelvic inflammatory disease (PID) refers to an infection in some or all of the female organs. The infection can be in the uterus, ovaries, fallopian tubes, or the surrounding tissues in the pelvis. PID can cause abdominal or pelvic pain that comes on suddenly (acute pelvic pain). PID is a serious infection because it can lead to lasting (chronic) pelvic pain or the inability to have children (infertility). °CAUSES °This condition is most often caused by an infection that is spread during sexual contact. However, the infection can also be caused by the normal bacteria that are found in the vaginal tissues if these bacteria travel upward into the reproductive organs. PID can also occur following: °· The birth of a baby. °· A miscarriage. °· An abortion. °· Major pelvic surgery. °· The use of an intrauterine device (IUD). °· A sexual assault. °RISK FACTORS °This condition is more likely to develop in women who: °· Are younger than 19 years of age. °· Are sexually active at a young age. °· Use nonbarrier contraception. °· Have multiple sexual partners. °· Have sex with someone who has symptoms of an STD (sexually transmitted disease). °· Use oral contraception. °At times, certain behaviors can also increase the possibility of getting PID, such as: °· Using a vaginal douche. °· Having an IUD in place. °SYMPTOMS °Symptoms of this condition include: °· Abdominal or pelvic pain. °· Fever. °· Chills. °· Abnormal vaginal discharge. °· Abnormal uterine bleeding. °· Unusual pain shortly after the end of a menstrual period. °· Painful urination. °· Pain with sexual intercourse. °· Nausea and vomiting. °DIAGNOSIS °To diagnose this condition, your health care provider will do a physical exam and take your medical history. A pelvic exam typically reveals great tenderness in the uterus and the surrounding pelvic tissues. You may also have tests, such as: °· Lab tests, including a pregnancy test, blood  tests, and urine test. °· Culture tests of the vagina and cervix to check for an STD. °· Ultrasound. °· A laparoscopic procedure to look inside the pelvis. °· Examining vaginal secretions under a microscope. °TREATMENT °Treatment for this condition may involve one or more approaches. °· Antibiotic medicines may be prescribed to be taken by mouth. °· Sexual partners may need to be treated if the infection is caused by an STD. °· For more severe cases, hospitalization may be needed to give antibiotics directly into a vein through an IV tube. °· Surgery may be needed if other treatments do not help, but this is rare. °It may take weeks until you are completely well. If you are diagnosed with PID, you should also be checked for human immunodeficiency virus (HIV). Your health care provider may test you for infection again 3 months after treatment. You should not have unprotected sex. °HOME CARE INSTRUCTIONS °· Take over-the-counter and prescription medicines only as told by your health care provider. °· If you were prescribed an antibiotic medicine, take it as told by your health care provider. Do not stop taking the antibiotic even if you start to feel better. °· Do not have sexual intercourse until treatment is completed or as told by your health care provider. If PID is confirmed, your recent sexual partners will need treatment, especially if you had unprotected sex. °· Keep all follow-up visits as told by your health care provider. This is important. °SEEK MEDICAL CARE IF: °· You have increased or abnormal vaginal discharge. °· Your pain does not improve. °· You vomit. °· You have a fever. °· You   cannot tolerate your medicines.  Your partner has an STD.  You have pain when you urinate. SEEK IMMEDIATE MEDICAL CARE IF:  You have increased abdominal or pelvic pain.  You have chills.  Your symptoms are not better in 72 hours even with treatment.   This information is not intended to replace advice given to  you by your health care provider. Make sure you discuss any questions you have with your health care provider.   Document Released: 10/22/2005 Document Revised: 07/13/2015 Document Reviewed: 11/29/2014 Elsevier Interactive Patient Education 2016 [employer]  Sexually Transmitted Disease A sexually transmitted disease (STD) is a disease or infection often passed to another person during sex. However, STDs can be passed through nonsexual ways. An STD can be passed through:  Spit (saliva).  Semen.  Blood.  Mucus from the vagina.  Pee (urine). HOW CAN I LESSEN MY CHANCES OF GETTING AN STD?  Use:  Latex condoms.  Water-soluble lubricants with condoms. Do not use petroleum jelly or oils.  Dental dams. These are small pieces of latex that are used as a barrier during oral sex.  Avoid having more than one sex partner.  Do not have sex with someone who has other sex partners.  Do not have sex with anyone you do not know or who is at high risk for an STD.  Avoid risky sex that can break your skin.  Do not have sex if you have open sores on your mouth or skin.  Avoid drinking too much alcohol or taking illegal drugs. Alcohol and drugs can affect your good judgment.  Avoid oral and anal sex acts.  Get shots (vaccines) for HPV and hepatitis.  If you are at risk of being infected with HIV, it is advised that you take a certain medicine daily to prevent HIV infection. This is called pre-exposure prophylaxis (PrEP). You may be at risk if:  You are a man who has sex with other men (MSM).  You are attracted to the opposite sex (heterosexual) and are having sex with more than one partner.  You take drugs with a needle.  You have sex with someone who has HIV.  Talk with your doctor about if you are at high risk of being infected with HIV. If you begin to take PrEP, get tested for HIV first. Get tested every 3 months for as long as you are taking PrEP.  Get tested for STDs every  year if you are sexually active. If you are treated for an STD, get tested again 3 months after you are treated. WHAT SHOULD I DO IF I THINK I HAVE AN STD?  See your doctor.  Tell your sex partner(s) that you have an STD. They should be tested and treated.  Do not have sex until your doctor says it is okay. WHEN SHOULD I GET HELP? Get help right away if:  You have bad belly (abdominal) pain.  You are a man and have puffiness (swelling) or pain in your testicles.  You are a woman and have puffiness in your vagina.   This information is not intended to replace advice given to you by your health care provider. Make sure you discuss any questions you have with your health care provider.   Document Released: 11/29/2004 Document Revised: 11/12/2014 Document Reviewed: 04/17/2013 Elsevier Interactive Patient Education [employer] Elsevier Inc.

## 2015-10-12 NOTE — ED Notes (Signed)
Patient was alert, oriented and stable upon discharge. RN went over AVS and patient had no further questions.  

## 2015-10-12 NOTE — ED Provider Notes (Signed)
CSN: [id number]     Arrival date & time 10/12/15  1959 History  By signing my name below, I, Freida Busman, attest that this documentation has been prepared under the direction and in the presence of non-physician practitioner, Antony Madura, PA-C. Electronically Signed: Freida Busman, Scribe. 10/12/2015. 8:53 PM.    Chief Complaint  Patient presents with  . Exposure to STD   The history is provided by the patient and medical records. No language interpreter was used.     HPI Comments:  Jaime Ramos is a 19 y.o. female who presents to the Emergency Department complaining of urinary urgency x a few days. She received a call today from her partner of 4 years stating they tested positive for chlamydia. She notes this has been her only partner in the last 6 months and she admits to unprotected sex with this partner. She denies vaginal discharge, fever, nausea, and vomiting. She also notes multiple episodes of diarrhea for the last day.  No alleviating factors noted. Pt was evaluated in the ED on 10/06/2015 for abdominal pain, she had a negative abdominal CT and was discharged with Zofran. She reports occasional episodes of mild abdominal pain since dsicharge.    Past Medical History  Diagnosis Date  . Asthma   . Obesity   . Precocious puberty   . Prediabetes   . Diabetes mellitus without complication (HCC)   . Trichomonal vaginitis   . Depression     doing good  . Chlamydia    Past Surgical History  Procedure Laterality Date  . Dilation and curettage of uterus N/A 11/09/2014    Procedure: DILATATION AND CURETTAGE;  Surgeon: Allie Bossier, MD;  Location: [facility];  Service: Gynecology;  Laterality: N/A;  . Dilation and curettage of uterus  2016   Family History  Problem Relation Age of Onset  . Diabetes Mother   . Hypertension Mother   . Obesity Mother   . Fibroids Mother   . Cancer Mother     thyroid  . Obesity Father   . Obesity Maternal Aunt   . Fibroids Maternal Aunt   . Obesity  Maternal Uncle   . Obesity Paternal Aunt   . Diabetes Maternal Grandmother   . Obesity Maternal Grandmother   . Hypertension Maternal Grandmother   . Diabetes Maternal Grandfather   . Obesity Maternal Grandfather   . Stroke Maternal Grandfather   . Heart disease Maternal Grandfather   . Hypertension Maternal Grandfather   . Obesity Paternal Grandfather   . Heart disease Paternal Grandfather   . Bipolar disorder Brother   . Bipolar disorder Brother    Social History  Substance Use Topics  . Smoking status: Never Smoker   . Smokeless tobacco: Never Used  . Alcohol Use: No   OB History    Gravida Para Term Preterm AB TAB SAB Ectopic Multiple Living   0      Review of Systems  Constitutional: Negative for fever and chills.  Respiratory: Negative for shortness of breath.   Cardiovascular: Negative for chest pain.  Gastrointestinal: Positive for abdominal pain and diarrhea. Negative for nausea and vomiting.  Genitourinary: Positive for urgency. Negative for vaginal discharge.  All other systems reviewed and are negative.   Allergies  Review of patient's allergies indicates no known allergies.  Home Medications   Prior to Admission medications   Medication Sig Start Date End Date Taking? Authorizing Provider  doxycycline (VIBRAMYCIN) 100  MG capsule Take 1 capsule (100 mg total) by mouth 2 (two) times daily. Take for 14 days 10/12/15   Antony Madura, PA-C  naproxen (NAPROSYN) 500 MG tablet Take 1 tablet (500 mg total) by mouth 2 (two) times daily. 10/12/15   Antony Madura, PA-C  ondansetron (ZOFRAN ODT) 4 MG disintegrating tablet Take 1 tablet (4 mg total) by mouth every 8 (eight) hours as needed for nausea or vomiting. 10/07/15   Tomasita Crumble, MD   BP 145/91 mmHg  Pulse 98  Temp(Src) 98.9 F (37.2 C) (Oral)  Resp 18  Ht  (1.676 m)  Wt 117.935 kg  BMI 41.99 kg/m2  SpO2 100%  LMP 09/13/2015 (Exact Date)   Physical Exam  Constitutional: She is oriented to  person, place, and time. She appears well-developed and well-nourished. No distress.  Nontoxic/nonseptic appearing  HENT:  Head: Normocephalic and atraumatic.  Eyes: Conjunctivae and EOM are normal. No scleral icterus.  Neck: Normal range of motion.  Pulmonary/Chest: Effort normal.  Respirations even and unlabored.  Abdominal: Soft. She exhibits no distension. There is tenderness.    Mild suprapubic TTP. No peritoneal signs or masses. Abdomen soft.  Genitourinary: There is no rash, tenderness or lesion on the right labia. There is no rash, tenderness or lesion on the left labia. Cervix exhibits friability (mild). Cervix exhibits no motion tenderness. Right adnexum displays tenderness. Right adnexum displays no mass. Left adnexum displays no mass and no tenderness. No bleeding in the vagina. No signs of injury around the vagina.  Chaperone (RN) was present for exam which was performed with no discomfort or complications.   Musculoskeletal: Normal range of motion.  Neurological: She is alert and oriented to person, place, and time. She exhibits normal muscle tone. Coordination normal.  Skin: Skin is warm and dry. No rash noted. She is not diaphoretic. No erythema. No pallor.  Psychiatric: She has a normal mood and affect. Her behavior is normal.  Nursing note and vitals reviewed.   ED Course  Procedures   DIAGNOSTIC STUDIES:  Oxygen Saturation is 100% on RA, normal by my interpretation.    COORDINATION OF CARE:  8:49 PM Discussed treatment plan with pt at bedside and pt agreed to plan.  Labs Review Labs Reviewed  WET PREP, GENITAL - Abnormal; Notable for the following:    Clue Cells Wet Prep HPF POC FEW (*)    WBC, Wet Prep HPF POC FEW (*)    All other components within normal limits  URINALYSIS, ROUTINE W REFLEX MICROSCOPIC (NOT AT [facility]) - Abnormal; Notable for the following:    Specific Gravity, Urine 1.034 (*)    Nitrite POSITIVE (*)    All other components within normal  limits  URINE MICROSCOPIC-ADD ON - Abnormal; Notable for the following:    Squamous Epithelial / LPF 0-5 (*)    Bacteria, UA RARE (*)    All other components within normal limits  URINE CULTURE  POC URINE PREG, ED  GC/CHLAMYDIA PROBE AMP ([place]) NOT AT [facility]    I have personally reviewed and evaluated these lab results as part of my medical decision-making.   MDM   Final diagnoses:  PID (acute pelvic inflammatory disease)    19 year old female presents to the emergency department for STD check. She reports that her sexual partner recently tested positive for chlamydia. She denies any other sexual partners. Patient noted to have R adnexal TTP. She presented one week ago for abdominal pain in her right lower quadrant. CT  was negative. There is concern for underlying pelvic inflammatory disease. Patient treated in the emergency department with Rocephin. Will discharge with doxycycline course. Patient instructed to follow-up with her OB/GYN. Return precautions given at discharge. Patient discharged in good condition with no unaddressed concerns.  I personally performed the services described in this documentation, which was scribed in my presence. The recorded information has been reviewed and is accurate.    Filed Vitals:   10/12/15 2021  BP: 145/91  Pulse: 98  Temp: 98.9 F (37.2 C)  TempSrc: Oral  Resp: 18  Height: 5\' 6"  (1.676 m)  Weight: 117.935 kg  SpO2: 100%      Antony MaduraKelly Afnan Emberton, PA-C 10/12/15 2227  Melene Planan Floyd, DO 10/12/15 2313

## 2015-10-13 LAB — GC/CHLAMYDIA PROBE AMP (~~LOC~~) NOT AT ARMC
Chlamydia: NEGATIVE
Neisseria Gonorrhea: NEGATIVE

## 2015-10-14 LAB — URINE CULTURE

## 2015-11-09 ENCOUNTER — Emergency Department (HOSPITAL_COMMUNITY)
Admission: EM | Admit: 2015-11-09 | Discharge: 2015-11-09 | Disposition: A | Discharge status: Home / Self Care | Payer: 59 | Attending: Emergency Medicine | Admitting: Emergency Medicine

## 2015-11-09 ENCOUNTER — Encounter (HOSPITAL_COMMUNITY): Payer: Self-pay | Admitting: Emergency Medicine

## 2015-11-09 DIAGNOSIS — Z3202 Encounter for pregnancy test, result negative: Secondary | ICD-10-CM | POA: Diagnosis not present

## 2015-11-09 DIAGNOSIS — R4701 Aphasia: Secondary | ICD-10-CM | POA: Diagnosis not present

## 2015-11-09 DIAGNOSIS — R4781 Slurred speech: Secondary | ICD-10-CM | POA: Diagnosis not present

## 2015-11-09 DIAGNOSIS — Z8619 Personal history of other infectious and parasitic diseases: Secondary | ICD-10-CM | POA: Diagnosis not present

## 2015-11-09 DIAGNOSIS — J45909 Unspecified asthma, uncomplicated: Secondary | ICD-10-CM | POA: Insufficient documentation

## 2015-11-09 DIAGNOSIS — R200 Anesthesia of skin: Secondary | ICD-10-CM | POA: Diagnosis present

## 2015-11-09 DIAGNOSIS — Z8659 Personal history of other mental and behavioral disorders: Secondary | ICD-10-CM | POA: Insufficient documentation

## 2015-11-09 DIAGNOSIS — E669 Obesity, unspecified: Secondary | ICD-10-CM | POA: Insufficient documentation

## 2015-11-09 DIAGNOSIS — E119 Type 2 diabetes mellitus without complications: Secondary | ICD-10-CM | POA: Diagnosis not present

## 2015-11-09 DIAGNOSIS — R531 Weakness: Secondary | ICD-10-CM | POA: Diagnosis not present

## 2015-11-09 LAB — COMPREHENSIVE METABOLIC PANEL
ALT: 19 U/L (ref 14–54)
AST: 20 U/L (ref 15–41)
Albumin: 3.7 g/dL (ref 3.5–5.0)
Alkaline Phosphatase: 106 U/L (ref 38–126)
Anion gap: 6 (ref 5–15)
BILIRUBIN TOTAL: 0.3 mg/dL (ref 0.3–1.2)
BUN: 6 mg/dL (ref 6–20)
CHLORIDE: 106 mmol/L (ref 101–111)
CO2: 26 mmol/L (ref 22–32)
Calcium: 8.7 mg/dL — ABNORMAL LOW (ref 8.9–10.3)
Creatinine, Ser: 0.46 mg/dL (ref 0.44–1.00)
Glucose, Bld: 123 mg/dL — ABNORMAL HIGH (ref 65–99)
POTASSIUM: 3.7 mmol/L (ref 3.5–5.1)
Sodium: 138 mmol/L (ref 135–145)
TOTAL PROTEIN: 7.5 g/dL (ref 6.5–8.1)

## 2015-11-09 LAB — I-STAT BETA HCG BLOOD, ED (MC, WL, AP ONLY): I-stat hCG, quantitative: <5 m[IU]/mL (ref <5)

## 2015-11-09 LAB — CBC
HEMATOCRIT: 37.4 % (ref 36.0–46.0)
Hemoglobin: 11.1 g/dL — ABNORMAL LOW (ref 12.0–15.0)
MCH: 23.4 pg — AB (ref 26.0–34.0)
MCHC: 29.7 g/dL — AB (ref 30.0–36.0)
MCV: 78.9 fL (ref 78.0–100.0)
PLATELETS: 452 10*3/uL — AB (ref 150–400)
RBC: 4.74 MIL/uL (ref 3.87–5.11)
RDW: 15.5 % (ref 11.5–15.5)
WBC: 8.4 10*3/uL (ref 4.0–10.5)

## 2015-11-09 LAB — I-STAT TROPONIN, ED: TROPONIN I, POC: 0.01 ng/mL (ref 0.00–0.08)

## 2015-11-09 LAB — CBG MONITORING, ED: GLUCOSE-CAPILLARY: 107 mg/dL — AB (ref 65–99)

## 2015-11-09 NOTE — ED Provider Notes (Signed)
CSN: [id number]     Arrival date & time 11/09/15  1437 History   First MD Initiated Contact with Patient 11/09/15 1752     Chief Complaint  Patient presents with  . Numbness  . Aphasia     (Consider location/radiation/quality/duration/timing/severity/associated sxs/prior Treatment) HPI Comments: Patient here complaining of intermittent bilateral hand numbness as well as bilateral feet numbness and tingling. Says occasionally gets slurred speech. Mother states patient has been under greater stress. Symptoms can last for hours or days. Nothing precipitates them. No associated headache noted. Denies any trouble with her gait. Feels at her baseline at this time. Denies any drug use  The history is provided by the patient.    Past Medical History  Diagnosis Date  . Asthma   . Obesity   . Precocious puberty   . Prediabetes   . Diabetes mellitus without complication (HCC)   . Trichomonal vaginitis   . Depression     doing good  . Chlamydia    Past Surgical History  Procedure Laterality Date  . Dilation and curettage of uterus N/A 11/09/2014    Procedure: DILATATION AND CURETTAGE;  Surgeon: Allie Bossier, MD;  Location: [facility];  Service: Gynecology;  Laterality: N/A;  . Dilation and curettage of uterus  2016   Family History  Problem Relation Age of Onset  . Diabetes Mother   . Hypertension Mother   . Obesity Mother   . Fibroids Mother   . Cancer Mother     thyroid  . Obesity Father   . Obesity Maternal Aunt   . Fibroids Maternal Aunt   . Obesity Maternal Uncle   . Obesity Paternal Aunt   . Diabetes Maternal Grandmother   . Obesity Maternal Grandmother   . Hypertension Maternal Grandmother   . Diabetes Maternal Grandfather   . Obesity Maternal Grandfather   . Stroke Maternal Grandfather   . Heart disease Maternal Grandfather   . Hypertension Maternal Grandfather   . Obesity Paternal Grandfather   . Heart disease Paternal Grandfather   . Bipolar disorder Brother   .  Bipolar disorder Brother    Social History  Substance Use Topics  . Smoking status: Never Smoker   . Smokeless tobacco: Never Used  . Alcohol Use: No   OB History    Gravida Para Term Preterm AB TAB SAB Ectopic Multiple Living   2 1  1 1  1    0     Review of Systems  All other systems reviewed and are negative.     Allergies  Review of patient's allergies indicates no known allergies.  Home Medications   Prior to Admission medications   Medication Sig Start Date End Date Taking? Authorizing Provider  naproxen sodium (ANAPROX) 220 MG tablet Take 220 mg by mouth 2 (two) times daily as needed (pain).   Yes Historical Provider, MD  doxycycline (VIBRAMYCIN) 100 MG capsule Take 1 capsule (100 mg total) by mouth 2 (two) times daily. Take for 14 days Patient not taking: Reported on 11/09/2015 10/12/15   Antony Madura, PA-C  naproxen (NAPROSYN) 500 MG tablet Take 1 tablet (500 mg total) by mouth 2 (two) times daily. Patient not taking: Reported on 11/09/2015 10/12/15   Antony Madura, PA-C  ondansetron (ZOFRAN ODT) 4 MG disintegrating tablet Take 1 tablet (4 mg total) by mouth every 8 (eight) hours as needed for nausea or vomiting. 10/07/15   Tomasita Crumble, MD   BP 128/71 mmHg  Pulse 88  Temp(Src) 98.3 F (  36.8 C) (Oral)  SpO2 100%  LMP 10/13/2015 Physical Exam  Constitutional: She is oriented to person, place, and time. She appears well-developed and well-nourished.  Non-toxic appearance. No distress.  HENT:  Head: Normocephalic and atraumatic.  Eyes: Conjunctivae, EOM and lids are normal. Pupils are equal, round, and reactive to light.  Neck: Normal range of motion. Neck supple. No tracheal deviation present. No thyroid mass present.  Cardiovascular: Normal rate, regular rhythm and normal heart sounds.  Exam reveals no gallop.   No murmur heard. Pulmonary/Chest: Effort normal and breath sounds normal. No stridor. No respiratory distress. She has no decreased breath sounds. She has no  wheezes. She has no rhonchi. She has no rales.  Abdominal: Soft. Normal appearance and bowel sounds are normal. She exhibits no distension. There is no tenderness. There is no rebound and no CVA tenderness.  Musculoskeletal: Normal range of motion. She exhibits no edema or tenderness.  Neurological: She is alert and oriented to person, place, and time. She has normal strength. No cranial nerve deficit or sensory deficit. Coordination and gait normal. GCS eye subscore is 4. GCS verbal subscore is 5. GCS motor subscore is 6.  Skin: Skin is warm and dry. No abrasion and no rash noted.  Psychiatric: She has a normal mood and affect. Her speech is normal and behavior is normal.  Nursing note and vitals reviewed.   ED Course  Procedures (including critical care time) Labs Review Labs Reviewed  CBC - Abnormal; Notable for the following:    Hemoglobin 11.1 (*)    MCH 23.4 (*)    MCHC 29.7 (*)    Platelets 452 (*)    All other components within normal limits  COMPREHENSIVE METABOLIC PANEL - Abnormal; Notable for the following:    Glucose, Bld 123 (*)    Calcium 8.7 (*)    All other components within normal limits  CBG MONITORING, ED - Abnormal; Notable for the following:    Glucose-Capillary 107 (*)    All other components within normal limits  I-STAT TROPOININ, ED  I-STAT BETA HCG BLOOD, ED ([facility], [facility], [facility] ONLY)    Imaging Review No results found. I have personally reviewed and evaluated these images and lab results as part of my medical decision-making.   EKG Interpretation None      MDM   Final diagnoses:  None   Labs reviewed with patient. No acute findings noted except for mild hyperglycemia. Neurological exam is nonfocal at this time. Patient does not appear to have any indication for emergent intracranial imaging. Her exam is normal at this time. Will be given follow-up with neurology.    Lorre NickAnthony Brailey Buescher, MD 11/09/15 [phone]

## 2015-11-09 NOTE — ED Notes (Signed)
Pt states she began having numbness and tingling in her hands and feet x 2 days ago with slurred speech and blurred vision. States this has happened once before without any explanation. 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 Denies any head trauma or use of birth control. Denies pain, fever/chills, N/V/D. Grips/plantar/dorsiflexion all equal bilaterally, no pronator drift, passed finger-nose-finger test, PERRL. No tongue or uvula deviation visible.

## 2015-11-09 NOTE — Discharge Instructions (Signed)

## 2015-12-07 ENCOUNTER — Encounter: Payer: Self-pay | Admitting: Neurology

## 2015-12-07 ENCOUNTER — Ambulatory Visit: Payer: Self-pay | Admitting: Neurology

## 2015-12-07 ENCOUNTER — Telehealth: Payer: Self-pay | Admitting: *Deleted

## 2015-12-07 NOTE — Telephone Encounter (Signed)
no showed new patient appt 

## 2015-12-26 ENCOUNTER — Emergency Department (HOSPITAL_COMMUNITY)
Admission: EM | Admit: 2015-12-26 | Discharge: 2015-12-27 | Disposition: A | Discharge status: Home / Self Care | Payer: 59 | Attending: Emergency Medicine | Admitting: Emergency Medicine

## 2015-12-26 ENCOUNTER — Encounter (HOSPITAL_COMMUNITY): Payer: Self-pay | Admitting: Emergency Medicine

## 2015-12-26 DIAGNOSIS — J45909 Unspecified asthma, uncomplicated: Secondary | ICD-10-CM | POA: Insufficient documentation

## 2015-12-26 DIAGNOSIS — Z8619 Personal history of other infectious and parasitic diseases: Secondary | ICD-10-CM | POA: Insufficient documentation

## 2015-12-26 DIAGNOSIS — G43809 Other migraine, not intractable, without status migrainosus: Secondary | ICD-10-CM | POA: Diagnosis not present

## 2015-12-26 DIAGNOSIS — Z3202 Encounter for pregnancy test, result negative: Secondary | ICD-10-CM | POA: Diagnosis not present

## 2015-12-26 DIAGNOSIS — E119 Type 2 diabetes mellitus without complications: Secondary | ICD-10-CM | POA: Insufficient documentation

## 2015-12-26 DIAGNOSIS — Z8659 Personal history of other mental and behavioral disorders: Secondary | ICD-10-CM | POA: Insufficient documentation

## 2015-12-26 DIAGNOSIS — E669 Obesity, unspecified: Secondary | ICD-10-CM | POA: Diagnosis not present

## 2015-12-26 DIAGNOSIS — R51 Headache: Secondary | ICD-10-CM | POA: Diagnosis present

## 2015-12-26 LAB — PREGNANCY, URINE: PREG TEST UR: NEGATIVE

## 2015-12-26 MED ORDER — METOCLOPRAMIDE HCL 5 MG/ML IJ SOLN
10.0000 mg | Freq: Once | INTRAMUSCULAR | Status: AC
  Administered 2015-12-26: 10 mg via INTRAVENOUS
  Filled 2015-12-26: qty 2

## 2015-12-26 MED ORDER — METOCLOPRAMIDE HCL 10 MG PO TABS: 10.0000 mg | ORAL_TABLET | Freq: Four times a day (QID) | ORAL | Status: DC | PRN

## 2015-12-26 MED ORDER — DIPHENHYDRAMINE HCL 50 MG/ML IJ SOLN
25.0000 mg | Freq: Once | INTRAMUSCULAR | Status: AC
  Administered 2015-12-26: 25 mg via INTRAVENOUS
  Filled 2015-12-26: qty 1

## 2015-12-26 MED ORDER — NAPROXEN 500 MG PO TABS: 500.0000 mg | ORAL_TABLET | Freq: Two times a day (BID) | ORAL | Status: DC

## 2015-12-26 MED ORDER — SODIUM CHLORIDE 0.9 % IV BOLUS (SEPSIS)
1000.0000 mL | Freq: Once | INTRAVENOUS | Status: AC
  Administered 2015-12-26: 1000 mL via INTRAVENOUS

## 2015-12-26 MED ORDER — KETOROLAC TROMETHAMINE 30 MG/ML IJ SOLN
30.0000 mg | Freq: Once | INTRAMUSCULAR | Status: AC
Start: 2015-12-26 — End: 2015-12-26
  Administered 2015-12-26: 30 mg via INTRAVENOUS
  Filled 2015-12-26: qty 1

## 2015-12-26 NOTE — ED Notes (Signed)
Pt states headache since Friday w/ some nausea. Last medication taking was this morning.

## 2015-12-26 NOTE — ED Provider Notes (Signed)
CSN: [id number]     Arrival date & time 12/26/15  1904 History  By signing my name below, I, Linus Galas, attest that this documentation has been prepared under the direction and in the presence of Rolland Porter, MD. Electronically Signed: Linus Galas, ED Scribe. 12/26/2015. 9:25 PM.  Chief Complaint  Patient presents with  . Headache   The history is provided by the patient. No language interpreter was used.   HPI Comments: Sheletha Bow is a 20 y.o. female with a PMHx of asthma, obesity, DM, trichomonal vaginitis, depression and chlamydia who presents to the Emergency Department complaining of constant headache that began Friday, 3 days ago while at work. Pt also reports nausea, lightheadedness and photophobia. Pt localizes the pain to front of her head. She has taken Aleve with no relief. Pt denies any sore throat, numbness, weakness, tingling, neck stiffness, vomiting, or any other symptoms at this time.  Pt reports she usually has regularly occuring menstrual periods. Her last menstrual period was 11/18/15. Pt menstruation period is 8 days late. Pt had a negative at-home pregnancy test.   Past Medical History  Diagnosis Date  . Asthma   . Obesity   . Precocious puberty   . Prediabetes   . Diabetes mellitus without complication (HCC)   . Trichomonal vaginitis   . Depression     doing good  . Chlamydia    Past Surgical History  Procedure Laterality Date  . Dilation and curettage of uterus N/A 11/09/2014    Procedure: DILATATION AND CURETTAGE;  Surgeon: Allie Bossier, MD;  Location: [facility];  Service: Gynecology;  Laterality: N/A;  . Dilation and curettage of uterus  2016   Family History  Problem Relation Age of Onset  . Diabetes Mother   . Hypertension Mother   . Obesity Mother   . Fibroids Mother   . Cancer Mother     thyroid  . Obesity Father   . Obesity Maternal Aunt   . Fibroids Maternal Aunt   . Obesity Maternal Uncle   . Obesity Paternal Aunt   . Diabetes Maternal  Grandmother   . Obesity Maternal Grandmother   . Hypertension Maternal Grandmother   . Diabetes Maternal Grandfather   . Obesity Maternal Grandfather   . Stroke Maternal Grandfather   . Heart disease Maternal Grandfather   . Hypertension Maternal Grandfather   . Obesity Paternal Grandfather   . Heart disease Paternal Grandfather   . Bipolar disorder Brother   . Bipolar disorder Brother    Social History  Substance Use Topics  . Smoking status: Never Smoker   . Smokeless tobacco: Never Used  . Alcohol Use: No   OB History    Gravida Para Term Preterm AB TAB SAB Ectopic Multiple Living   0     Review of Systems  Constitutional: Negative for fever, chills, diaphoresis, appetite change and fatigue.  HENT: Negative for mouth sores, sore throat and trouble swallowing.   Eyes: Positive for photophobia. Negative for visual disturbance.  Respiratory: Negative for cough, chest tightness, shortness of breath and wheezing.   Cardiovascular: Negative for chest pain.  Gastrointestinal: Positive for nausea. Negative for vomiting, abdominal pain, diarrhea and abdominal distention.  Endocrine: Negative for polydipsia, polyphagia and polyuria.  Genitourinary: Negative for dysuria, frequency and hematuria.  Musculoskeletal: Negative for gait problem and neck stiffness.  Skin: Negative for color change, pallor and rash.  Neurological: Positive for light-headedness and headaches. Negative  for dizziness, syncope, weakness and numbness.  Hematological: Does not bruise/bleed easily.  Psychiatric/Behavioral: Negative for behavioral problems and confusion.  All other systems reviewed and are negative.  Allergies  Review of patient's allergies indicates no known allergies.  Home Medications   Prior to Admission medications   Medication Sig Start Date End Date Taking? Authorizing Provider  naproxen sodium (ANAPROX) 220 MG tablet Take 220 mg by mouth 2 (two) times daily as needed  (pain).   Yes Historical Provider, MD  metoCLOPramide (REGLAN) 10 MG tablet Take 1 tablet (10 mg total) by mouth every 6 (six) hours as needed for nausea (Headache. take with benadryl and Naprosyn). 12/26/15   Rolland Porter, MD  naproxen (NAPROSYN) 500 MG tablet Take 1 tablet (500 mg total) by mouth 2 (two) times daily. 12/26/15   Rolland Porter, MD   BP 123/60 mmHg  Pulse 78  Temp(Src) 97.7 F (36.5 C) (Temporal)  Resp 20  Ht  (1.676 m)  SpO2 100%  LMP 11/23/2015   Physical Exam  Constitutional: She is oriented to person, place, and time. She appears well-developed and well-nourished. No distress.  HENT:  Head: Normocephalic.  Eyes: Conjunctivae are normal. Pupils are equal, round, and reactive to light. No scleral icterus.  Neck: Normal range of motion. Neck supple. No thyromegaly present.  Cardiovascular: Normal rate and regular rhythm.  Exam reveals no gallop and no friction rub.   No murmur heard. Pulmonary/Chest: Effort normal and breath sounds normal. No respiratory distress. She has no wheezes. She has no rales.  Abdominal: Soft. Bowel sounds are normal. She exhibits no distension. There is no tenderness. There is no rebound.  Musculoskeletal: Normal range of motion.  Neurological: She is alert and oriented to person, place, and time.  Skin: Skin is warm and dry. No rash noted.  Psychiatric: She has a normal mood and affect. Her behavior is normal.  Nursing note and vitals reviewed.   ED Course  Procedures   DIAGNOSTIC STUDIES: Oxygen Saturation is 100% on room air, normal by my interpretation.    COORDINATION OF CARE: 9:20 PM Will orde pregnancy screening. Will give fluids, Toradol, Reglan and Bendryl. Discussed treatment plan with pt at bedside and pt agreed to plan.  Labs Review Labs Reviewed  PREGNANCY, URINE    Imaging Review No results found. I have personally reviewed and evaluated these images and lab results as part of my medical decision-making.   EKG  Interpretation None     MDM   Final diagnoses:  Other migraine without status migrainosus, not intractable    I personally performed the services described in this documentation, which was scribed in my presence. The recorded information has been reviewed and is accurate.    Rolland Porter, MD 12/26/15 [phone]

## 2015-12-26 NOTE — ED Notes (Signed)
Pt c/o headache with nausea since Friday. 

## 2015-12-26 NOTE — Discharge Instructions (Signed)
25 mg Over-the -counter Benadryl, with Reglan, and naproxen, for any recurrent headache.   Migraine Headache A migraine headache is an intense, throbbing pain on one or both sides of your head. A migraine can last for 30 minutes to several hours. CAUSES  The exact cause of a migraine headache is not always known. However, a migraine may be caused when nerves in the brain become irritated and release chemicals that cause inflammation. This causes pain. Certain things may also trigger migraines, such as:  Alcohol.  Smoking.  Stress.  Menstruation.  Aged cheeses.  Foods or drinks that contain nitrates, glutamate, aspartame, or tyramine.  Lack of sleep.  Chocolate.  Caffeine.  Hunger.  Physical exertion.  Fatigue.  Medicines used to treat chest pain (nitroglycerine), birth control pills, estrogen, and some blood pressure medicines. SIGNS AND SYMPTOMS  Pain on one or both sides of your head.  Pulsating or throbbing pain.  Severe pain that prevents daily activities.  Pain that is aggravated by any physical activity.  Nausea, vomiting, or both.  Dizziness.  Pain with exposure to bright lights, loud noises, or activity.  General sensitivity to bright lights, loud noises, or smells. Before you get a migraine, you may get warning signs that a migraine is coming (aura). An aura may include:  Seeing flashing lights.  Seeing bright spots, halos, or zigzag lines.  Having tunnel vision or blurred vision.  Having feelings of numbness or tingling.  Having trouble talking.  Having muscle weakness. DIAGNOSIS  A migraine headache is often diagnosed based on:  Symptoms.  Physical exam.  A CT scan or MRI of your head. These imaging tests cannot diagnose migraines, but they can help rule out other causes of headaches. TREATMENT Medicines may be given for pain and nausea. Medicines can also be given to help prevent recurrent migraines.  HOME CARE INSTRUCTIONS  Only  take over-the-counter or prescription medicines for pain or discomfort as directed by your health care provider. The use of long-term narcotics is not recommended.  Lie down in a dark, quiet room when you have a migraine.  Keep a journal to find out what may trigger your migraine headaches. For example, write down:  What you eat and drink.  How much sleep you get.  Any change to your diet or medicines.  Limit alcohol consumption.  Quit smoking if you smoke.  Get 7-9 hours of sleep, or as recommended by your health care provider.  Limit stress.  Keep lights dim if bright lights bother you and make your migraines worse. SEEK IMMEDIATE MEDICAL CARE IF:   Your migraine becomes severe.  You have a fever.  You have a stiff neck.  You have vision loss.  You have muscular weakness or loss of muscle control.  You start losing your balance or have trouble walking.  You feel faint or pass out.  You have severe symptoms that are different from your first symptoms. MAKE SURE YOU:   Understand these instructions.  Will watch your condition.  Will get help right away if you are not doing well or get worse.   This information is not intended to replace advice given to you by your health care provider. Make sure you discuss any questions you have with your health care provider.   Document Released: 10/22/2005 Document Revised: 11/12/2014 Document Reviewed: 06/29/2013 Elsevier Interactive Patient Education [employer]

## 2015-12-27 NOTE — ED Notes (Signed)
Pt states understanding of care given and follow up instructions.  Ambulated from the ED  

## 2016-01-09 ENCOUNTER — Ambulatory Visit: Payer: Self-pay | Admitting: Adult Health

## 2016-01-11 IMAGING — US US OB TRANSVAGINAL
1 series · 14 of 28 positions shown · non-contrast
Comparison: 10/10/2014

CLINICAL DATA: Abdominal pain in pregnancy. Followup interval
growth

EXAM:
TRANSVAGINAL OB ULTRASOUND
TECHNIQUE: Transvaginal ultrasound was performed for complete evaluation of the
gestation as well as the maternal uterus, adnexal regions, and
pelvic cul-de-sac.

[Series 1: us ob transvaginal · 14 of 32 slices shown]
[im 2/32]
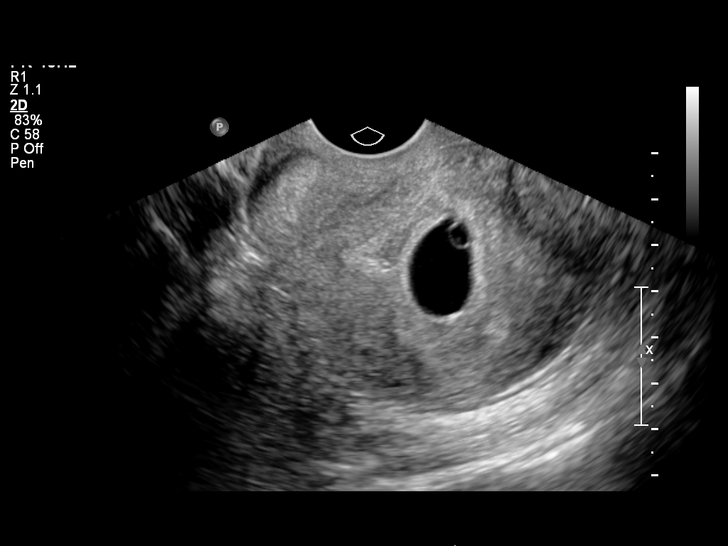
[im 4/32]
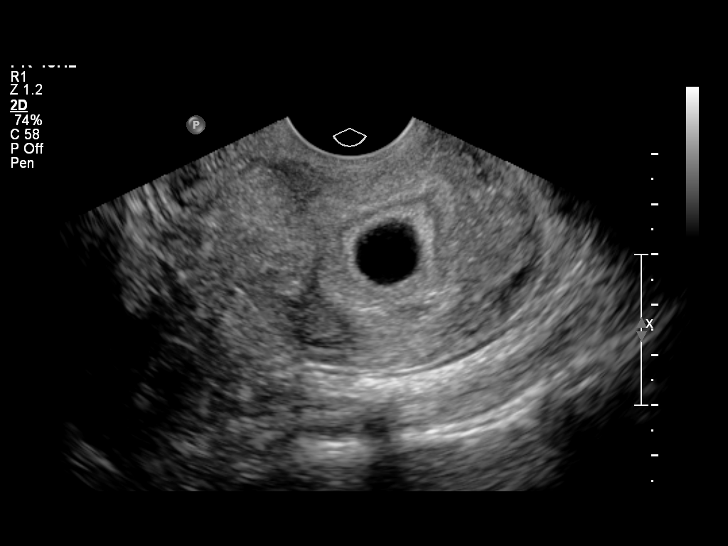
[im 6/32]
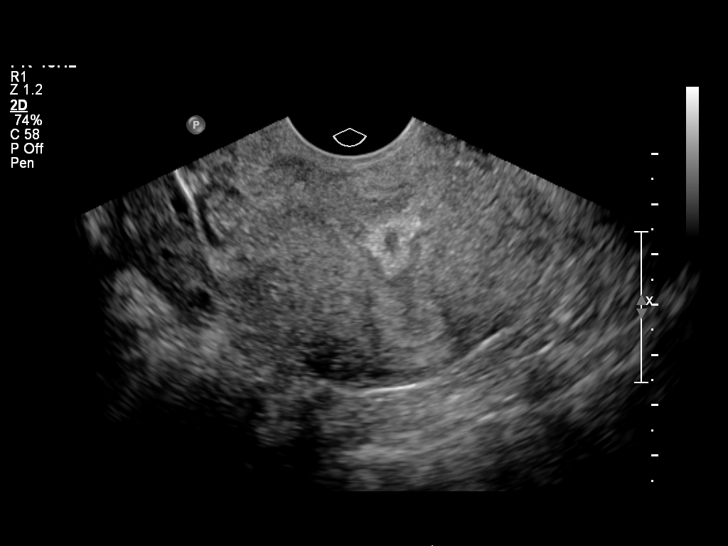
[im 9/32]
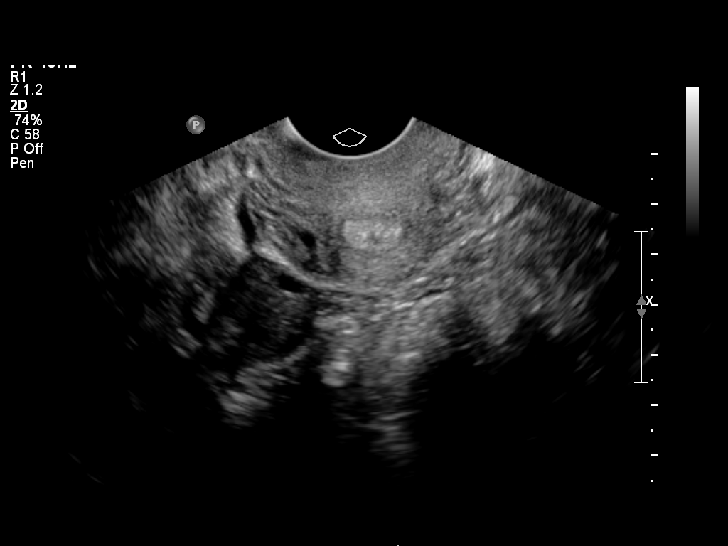
[im 11/32]
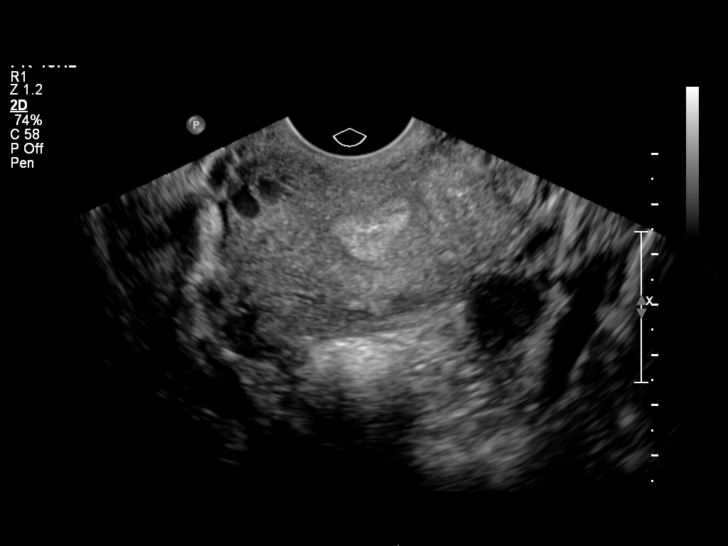
[im 13/32]
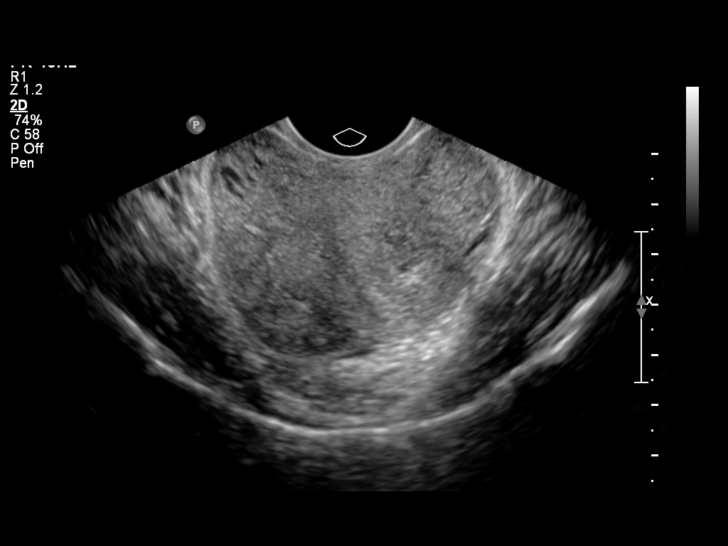
[im 15/32]
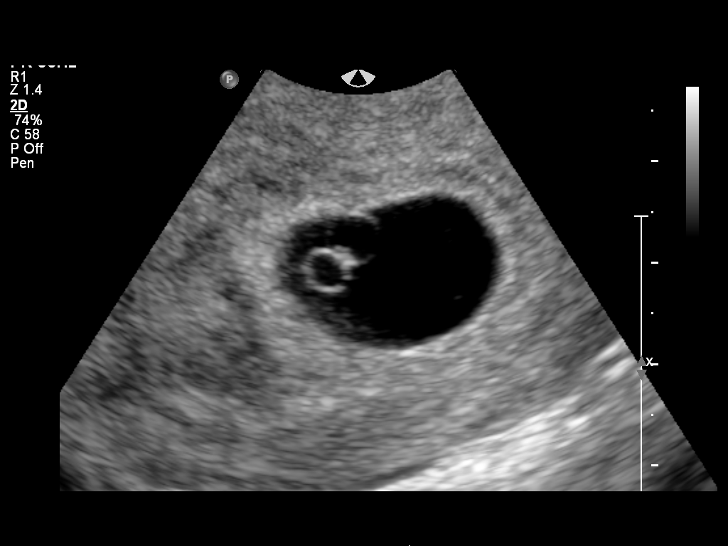
[im 18/32]
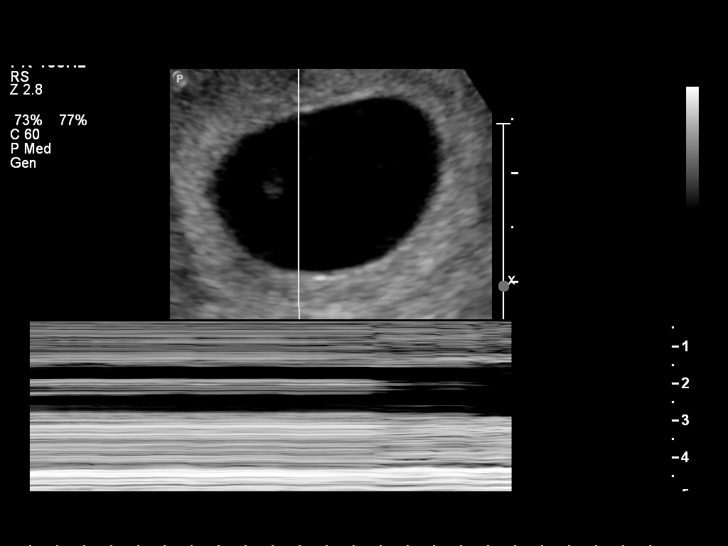
[im 20/32]
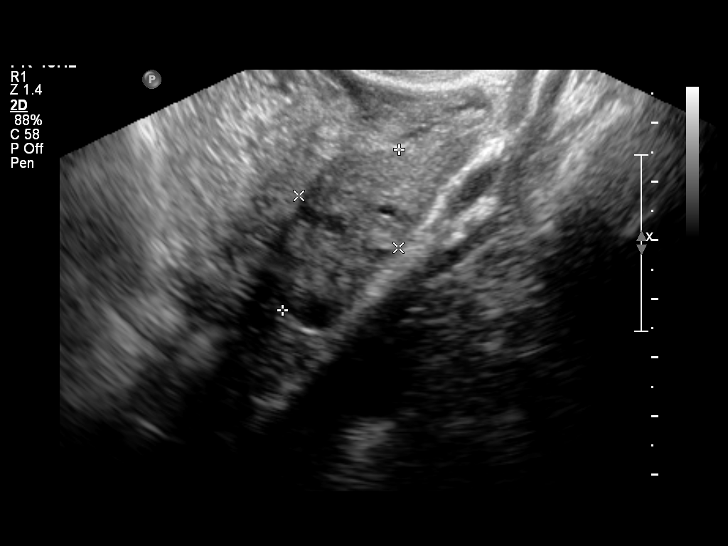
[im 22/32]
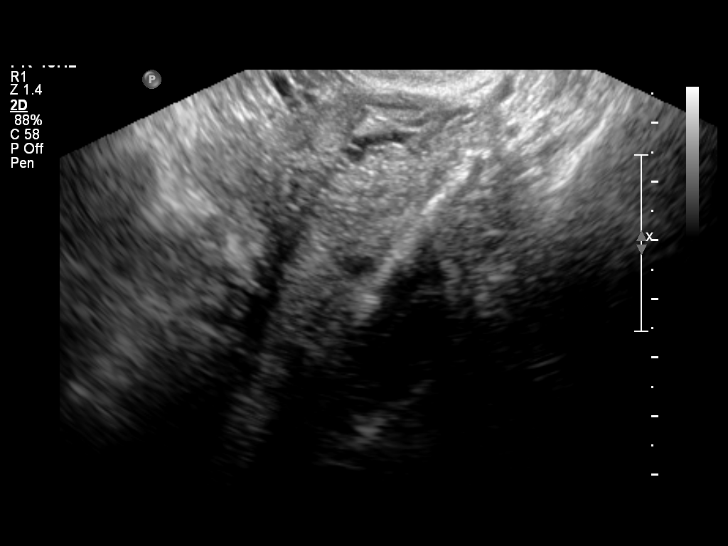
[im 25/32]
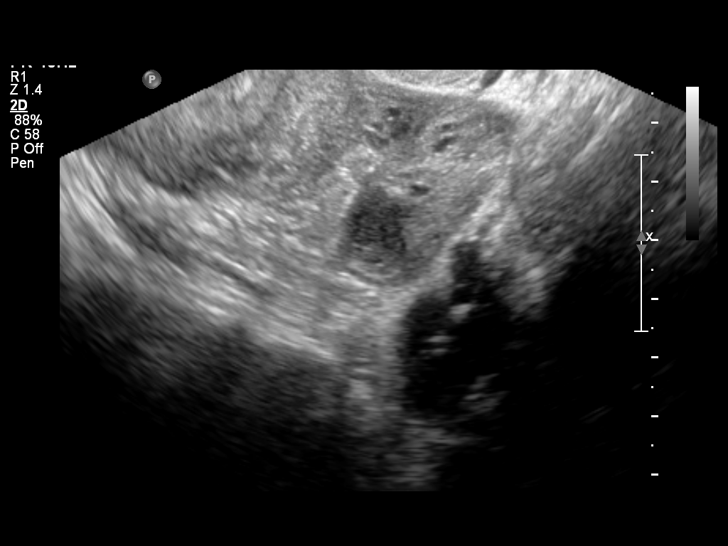
[im 27/32]
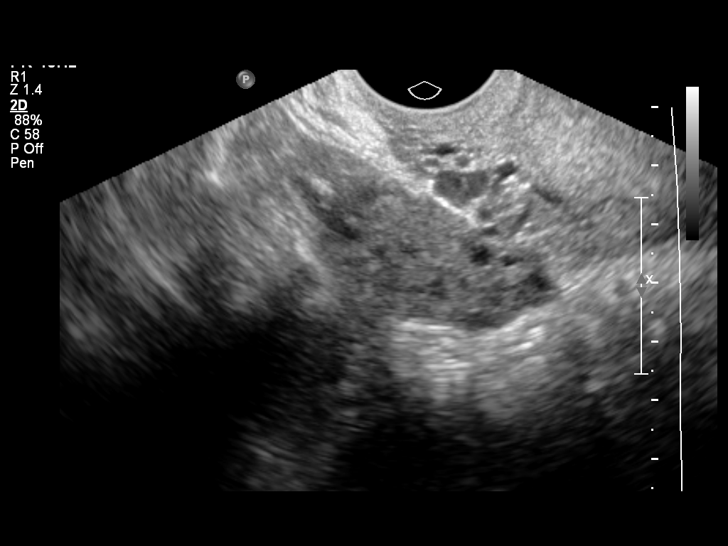
[im 29/32]
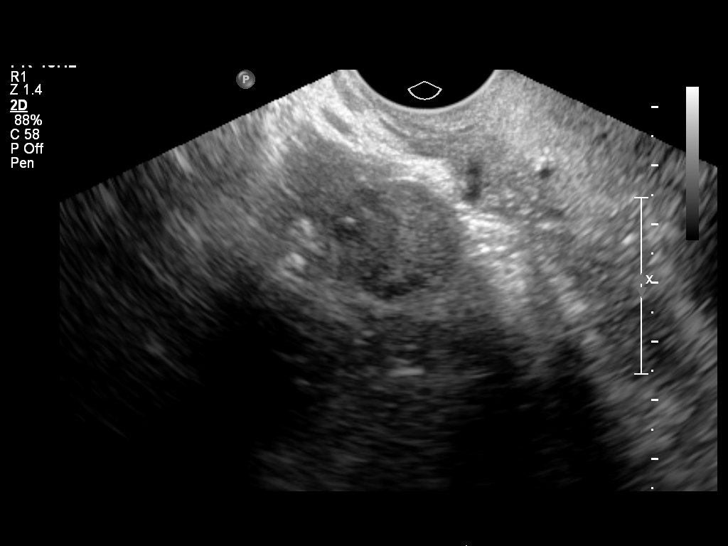
[im 32/32]
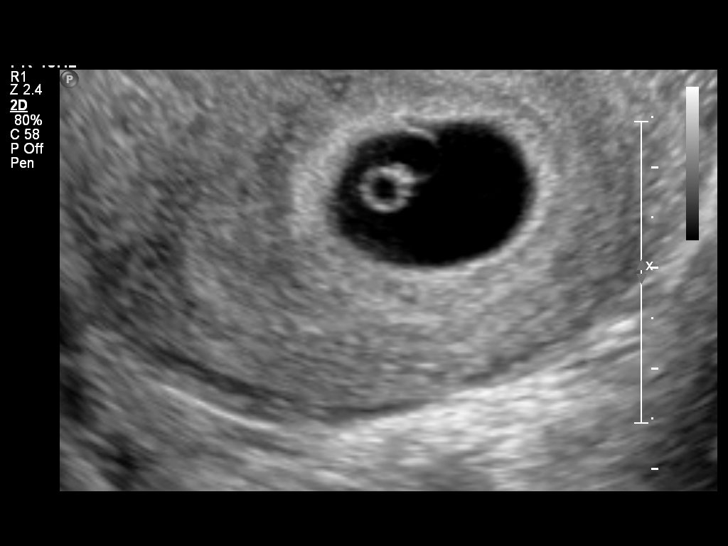

[14 of 28 positions shown; findings below may reference images not displayed]

FINDINGS: Intrauterine gestational sac: Visualized/normal in shape.

Yolk sac:  Visualized

Embryo:  Questionable small fetal pole present

Cardiac Activity: Not visualized

Heart Rate: N/A bpm

MSD:   mm    w     d

CRL:   2.5  mm   5 w thick d                  US EDC: 06/14/2015

Maternal uterus/adnexae: No subchorionic hemorrhage. No adnexal
masses or free fluid.
IMPRESSION: Intrauterine gestational sac noted with probable very early fetal
pole. However, the gestational age has decreased when compared to
prior study when gestational sac which utilized. Recommend continued
followup with repeat ultrasound in 10 to 14 days for further
evaluation

## 2016-01-23 ENCOUNTER — Emergency Department (HOSPITAL_COMMUNITY)
Admission: EM | Admit: 2016-01-23 | Discharge: 2016-01-24 | Disposition: A | Discharge status: Other Acute Inpatient Hospital | Payer: 59 | Attending: Emergency Medicine | Admitting: Emergency Medicine

## 2016-01-23 ENCOUNTER — Encounter (HOSPITAL_COMMUNITY): Payer: Self-pay | Admitting: Emergency Medicine

## 2016-01-23 DIAGNOSIS — J45909 Unspecified asthma, uncomplicated: Secondary | ICD-10-CM | POA: Insufficient documentation

## 2016-01-23 DIAGNOSIS — E119 Type 2 diabetes mellitus without complications: Secondary | ICD-10-CM | POA: Insufficient documentation

## 2016-01-23 DIAGNOSIS — F329 Major depressive disorder, single episode, unspecified: Secondary | ICD-10-CM | POA: Diagnosis not present

## 2016-01-23 DIAGNOSIS — R45851 Suicidal ideations: Secondary | ICD-10-CM | POA: Diagnosis present

## 2016-01-23 DIAGNOSIS — Z3202 Encounter for pregnancy test, result negative: Secondary | ICD-10-CM | POA: Diagnosis not present

## 2016-01-23 DIAGNOSIS — Z8619 Personal history of other infectious and parasitic diseases: Secondary | ICD-10-CM | POA: Insufficient documentation

## 2016-01-23 DIAGNOSIS — E669 Obesity, unspecified: Secondary | ICD-10-CM | POA: Diagnosis not present

## 2016-01-23 DIAGNOSIS — Z8659 Personal history of other mental and behavioral disorders: Secondary | ICD-10-CM | POA: Insufficient documentation

## 2016-01-23 LAB — COMPREHENSIVE METABOLIC PANEL
ALT: 19 U/L (ref 14–54)
ANION GAP: 9 (ref 5–15)
AST: 20 U/L (ref 15–41)
Albumin: 3.5 g/dL (ref 3.5–5.0)
Alkaline Phosphatase: 89 U/L (ref 38–126)
BUN: 7 mg/dL (ref 6–20)
CALCIUM: 8.6 mg/dL — AB (ref 8.9–10.3)
CHLORIDE: 107 mmol/L (ref 101–111)
CO2: 23 mmol/L (ref 22–32)
Creatinine, Ser: 0.52 mg/dL (ref 0.44–1.00)
GFR calc non Af Amer: >60 mL/min (ref >60)
Glucose, Bld: 85 mg/dL (ref 65–99)
POTASSIUM: 3.5 mmol/L (ref 3.5–5.1)
SODIUM: 139 mmol/L (ref 135–145)
Total Bilirubin: 0.3 mg/dL (ref 0.3–1.2)
Total Protein: 7.2 g/dL (ref 6.5–8.1)

## 2016-01-23 LAB — ETHANOL: Alcohol, Ethyl (B): <5 mg/dL (ref <5)

## 2016-01-23 LAB — SALICYLATE LEVEL

## 2016-01-23 LAB — CBC
HCT: 36.1 % (ref 36.0–46.0)
HEMOGLOBIN: 10.8 g/dL — AB (ref 12.0–15.0)
MCH: 23.7 pg — ABNORMAL LOW (ref 26.0–34.0)
MCHC: 29.9 g/dL — AB (ref 30.0–36.0)
MCV: 79.2 fL (ref 78.0–100.0)
Platelets: 411 10*3/uL — ABNORMAL HIGH (ref 150–400)
RBC: 4.56 MIL/uL (ref 3.87–5.11)
RDW: 16.2 % — ABNORMAL HIGH (ref 11.5–15.5)
WBC: 7 10*3/uL (ref 4.0–10.5)

## 2016-01-23 LAB — I-STAT BETA HCG BLOOD, ED (MC, WL, AP ONLY)

## 2016-01-23 LAB — ACETAMINOPHEN LEVEL

## 2016-01-23 NOTE — ED Provider Notes (Signed)
CSN: [id number]     Arrival date & time 01/23/16  1847 History   First MD Initiated Contact with Patient 01/23/16 1927     Chief Complaint  Patient presents with  . Suicidal  . Depression   HPI Jaime Ramos is a 20 y.o. female PMH significant for depression, diabetes, asthma presenting with a 3 month history of worsening depression. She endorses suicidal ideations, although she does not have a plan at this time. She states she has had a history of this before. She denies any triggers or precipitating events for feeling this way. She endorses of a mild frontal headache. She denies HI, hallucinations, fevers, chills, chest pain, shortness of breath, nausea, vomiting, change in bowel/ bladder habits.  Past Medical History  Diagnosis Date  . Asthma   . Obesity   . Precocious puberty   . Prediabetes   . Diabetes mellitus without complication (HCC)   . Trichomonal vaginitis   . Depression     doing good  . Chlamydia    Past Surgical History  Procedure Laterality Date  . Dilation and curettage of uterus N/A 11/09/2014    Procedure: DILATATION AND CURETTAGE;  Surgeon: Allie Bossier, MD;  Location: [facility];  Service: Gynecology;  Laterality: N/A;  . Dilation and curettage of uterus  2016   Family History  Problem Relation Age of Onset  . Diabetes Mother   . Hypertension Mother   . Obesity Mother   . Fibroids Mother   . Cancer Mother     thyroid  . Obesity Father   . Obesity Maternal Aunt   . Fibroids Maternal Aunt   . Obesity Maternal Uncle   . Obesity Paternal Aunt   . Diabetes Maternal Grandmother   . Obesity Maternal Grandmother   . Hypertension Maternal Grandmother   . Diabetes Maternal Grandfather   . Obesity Maternal Grandfather   . Stroke Maternal Grandfather   . Heart disease Maternal Grandfather   . Hypertension Maternal Grandfather   . Obesity Paternal Grandfather   . Heart disease Paternal Grandfather   . Bipolar disorder Brother   . Bipolar disorder Brother     Social History  Substance Use Topics  . Smoking status: Never Smoker   . Smokeless tobacco: Never Used  . Alcohol Use: No   OB History    Gravida Para Term Preterm AB TAB SAB Ectopic Multiple Living   0     Review of Systems  Ten systems are reviewed and are negative for acute change except as noted in the HPI  Allergies  Review of patient's allergies indicates no known allergies.  Home Medications   Prior to Admission medications   Medication Sig Start Date End Date Taking? Authorizing Provider  naproxen sodium (ANAPROX) 220 MG tablet Take 220 mg by mouth 2 (two) times daily as needed (pain).   Yes Historical Provider, MD  metoCLOPramide (REGLAN) 10 MG tablet Take 1 tablet (10 mg total) by mouth every 6 (six) hours as needed for nausea (Headache. take with benadryl and Naprosyn). Patient not taking: Reported on 01/23/2016 12/26/15   Rolland Porter, MD  naproxen (NAPROSYN) 500 MG tablet Take 1 tablet (500 mg total) by mouth 2 (two) times daily. Patient not taking: Reported on 01/23/2016 12/26/15   Rolland Porter, MD   BP 143/65 mmHg  Pulse 72  Temp(Src) 98.2 F (36.8 C) (Oral)  SpO2 100%  LMP 11/23/2015 Physical Exam  Constitutional: She appears  well-developed and well-nourished. No distress.  Obese  HENT:  Head: Normocephalic and atraumatic.  Eyes: Conjunctivae are normal. Right eye exhibits no discharge. Left eye exhibits no discharge. No scleral icterus.  Neck: No tracheal deviation present.  Cardiovascular: Normal rate.   Pulmonary/Chest: Effort normal. No respiratory distress.  Abdominal: Soft. Bowel sounds are normal. She exhibits no distension.  Musculoskeletal: She exhibits no edema.  Lymphadenopathy:    She has no cervical adenopathy.  Neurological: She is alert. Coordination normal.  Skin: Skin is warm and dry. No rash noted. She is not diaphoretic. No erythema.  Psychiatric:  Depressed appearing, flat affect  Nursing note and vitals  reviewed.   ED Course  Procedures  Labs Review Labs Reviewed  COMPREHENSIVE METABOLIC PANEL  ETHANOL  SALICYLATE LEVEL  ACETAMINOPHEN LEVEL  CBC  URINE RAPID DRUG SCREEN, HOSP PERFORMED  I-STAT BETA HCG BLOOD, ED ([facility], [facility], [facility] ONLY)   MDM   Final diagnoses:  Suicidal ideations   Patient with SI.  HCG, CMP, EtOH, salicylate, acetaminophen, CBC unremarkable. Patient is medically cleared at this time. Patient will be moved to psych observation until psych evaluation can be performed in the AM.   Melton KrebsSamantha Nicole Hanalei Glace, PA-C 01/24/16 0120  Bethann BerkshireJoseph Zammit, MD 01/25/16 2006

## 2016-01-23 NOTE — ED Notes (Signed)
Patient alert. Patient states she is here because " I feel everyone will be ok if I was gone. I find it difficult to do my daily routine. " Patient states she takes a bath and then that's as far as she gets. " Patient states she does not have a plan to hurt herself I feel helpless and that everyone will be ok without me here. " Support and encouragement offered. Q 15 minute checks in progress and maintained.

## 2016-01-23 NOTE — ED Notes (Signed)
Pt c/o depression for a few months now with SI starting today. Pt has been treated here in the past for the same. Pt does not have a plan. Pt denies any life events causing depression or SI. A&Ox4 and ambulatory.
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
## 2016-01-23 NOTE — ED Notes (Signed)
[facility] Ex[facility]:  Expected time:  Means of arrival:  Comments: Triage 8

## 2016-01-24 ENCOUNTER — Encounter (HOSPITAL_COMMUNITY): Payer: Self-pay | Admitting: *Deleted

## 2016-01-24 ENCOUNTER — Observation Stay (HOSPITAL_COMMUNITY)
Admission: AD | Admit: 2016-01-24 | Discharge: 2016-01-25 | Disposition: A | Discharge status: Home / Self Care | Payer: Medicaid Other | Source: Intra-hospital | Attending: Psychiatry | Admitting: Psychiatry

## 2016-01-24 DIAGNOSIS — R45851 Suicidal ideations: Secondary | ICD-10-CM | POA: Diagnosis not present

## 2016-01-24 DIAGNOSIS — F331 Major depressive disorder, recurrent, moderate: Secondary | ICD-10-CM | POA: Diagnosis not present

## 2016-01-24 DIAGNOSIS — F419 Anxiety disorder, unspecified: Secondary | ICD-10-CM | POA: Insufficient documentation

## 2016-01-24 DIAGNOSIS — F329 Major depressive disorder, single episode, unspecified: Secondary | ICD-10-CM | POA: Diagnosis present

## 2016-01-24 DIAGNOSIS — E119 Type 2 diabetes mellitus without complications: Secondary | ICD-10-CM | POA: Diagnosis not present

## 2016-01-24 DIAGNOSIS — R51 Headache: Secondary | ICD-10-CM | POA: Insufficient documentation

## 2016-01-24 DIAGNOSIS — F32A Depression, unspecified: Secondary | ICD-10-CM | POA: Diagnosis present

## 2016-01-24 DIAGNOSIS — Z6841 Body Mass Index (BMI) 40.0 and over, adult: Secondary | ICD-10-CM | POA: Diagnosis not present

## 2016-01-24 DIAGNOSIS — J45909 Unspecified asthma, uncomplicated: Secondary | ICD-10-CM | POA: Insufficient documentation

## 2016-01-24 DIAGNOSIS — E669 Obesity, unspecified: Secondary | ICD-10-CM | POA: Insufficient documentation

## 2016-01-24 LAB — CBG MONITORING, ED: GLUCOSE-CAPILLARY: 95 mg/dL (ref 65–99)

## 2016-01-24 MED ORDER — TRAZODONE HCL 50 MG PO TABS
50.0000 mg | ORAL_TABLET | Freq: Every evening | ORAL | Status: DC | PRN
  Administered 2016-01-24: 50 mg via ORAL
  Filled 2016-01-24: qty 1

## 2016-01-24 MED ORDER — NAPROXEN SODIUM 550 MG PO TABS
550.0000 mg | ORAL_TABLET | Freq: Two times a day (BID) | ORAL | Status: DC
  Filled 2016-01-24 (×4): qty 1

## 2016-01-24 MED ORDER — CITALOPRAM HYDROBROMIDE 10 MG PO TABS
10.0000 mg | ORAL_TABLET | Freq: Every day | ORAL | Status: DC
  Administered 2016-01-24 – 2016-01-25 (×2): 10 mg via ORAL
  Filled 2016-01-24 (×2): qty 1

## 2016-01-24 MED ORDER — ALUM & MAG HYDROXIDE-SIMETH 200-200-20 MG/5ML PO SUSP: 30.0000 mL | ORAL | Status: DC | PRN

## 2016-01-24 MED ORDER — HYDROXYZINE HCL 25 MG PO TABS
25.0000 mg | ORAL_TABLET | Freq: Four times a day (QID) | ORAL | Status: DC | PRN
  Administered 2016-01-24: 25 mg via ORAL
  Filled 2016-01-24: qty 1

## 2016-01-24 MED ORDER — ACETAMINOPHEN 325 MG PO TABS: 650.0000 mg | ORAL_TABLET | Freq: Four times a day (QID) | ORAL | Status: DC | PRN

## 2016-01-24 MED ORDER — NAPROXEN 500 MG PO TABS
500.0000 mg | ORAL_TABLET | Freq: Two times a day (BID) | ORAL | Status: DC
  Administered 2016-01-24 – 2016-01-25 (×3): 500 mg via ORAL
  Filled 2016-01-24 (×2): qty 1

## 2016-01-24 MED ORDER — MAGNESIUM HYDROXIDE 400 MG/5ML PO SUSP: 30.0000 mL | Freq: Every day | ORAL | Status: DC | PRN

## 2016-01-24 NOTE — H&P (Signed)
[facility] Admission Assessment Adult  Patient Identification: Jaime Ramos MRN:  [id number] Date of Evaluation:  01/24/2016 Chief Complaint:  MDD SUNGLE EPISODE Principal Diagnosis: Mood disorder of depressed type Diagnosis:   Patient Active Problem List   Diagnosis Date Noted  . Mood disorder of depressed type [F32.9] 01/24/2016  . Chlamydia [A74.9] 04/27/2015  . Nexplanon removal [Z30.46] 02/09/2015  . Preterm delivery [O60.10X0] 10/10/2013  . Preterm labor [O60.10X0] 10/09/2013  . Depression [F32.9] 10/02/2013  . Vaginal bleeding before [redacted] weeks gestation [O20.9] 10/02/2013  . Acanthosis [L83] 05/28/2012  . Menorrhagia with regular cycle [N92.0] 07/31/2011  . Goiter [E04.9] 07/31/2011  . Prediabetes [R73.03]   . Obesity [E66.9] 05/17/2011   History of Present Illness:PER TTS Note-Jaime Ramos is a 20 y.o. female PMH significant for depression, diabetes, asthma presenting with a 3 month history of worsening depression. She endorses suicidal ideations, although she does not have a plan at this time. She states she has had a history of this before. She denies any triggers or precipitating events for feeling this way. She endorses of a mild frontal headache. She denies HI, hallucinations, fevers, chills, chest pain, shortness of breath, nausea, vomiting, change in bowel/ bladder habits.  On Evaluation:Jaime Ramos is awake, alert and oriented X 4, found resting in the observation unit.  Denies suicidal or homicidal ideation at this time. Patient reports increasing depression over the past 6 months to 1 year. Reports she has never been evaluated or treated for mental illness in the past.  Denies auditory or visual hallucination and does not appear to be responding to internal stimuli.  States her depression 5/10 today. Patient states "I  haven't been myself, some days I have a hard time and I just cry."  Patient reports she has a good support system with her mother. Reports  fair appetite and is resting well. Support, encouragement and reassurance was provided.   Associated Signs/Symptoms: Depression Symptoms:  depressed mood, difficulty concentrating, anxiety, (Hypo) Manic Symptoms:  Irritable Mood, Anxiety Symptoms:  Excessive Worry, Psychotic Symptoms:  Hallucinations: None PTSD Symptoms: NA Total Time spent with patient: 30 minutes  Past Psychiatric History: Depression Is the patient at risk to self? No.  Has the patient been a risk to self in the past 6 months? No.  Has the patient been a risk to self within the distant past? No.  Is the patient a risk to others? No.  Has the patient been a risk to others in the past 6 months? No.  Has the patient been a risk to others within the distant past? No.   Prior Inpatient Therapy:   Prior Outpatient Therapy:    Alcohol Screening: 1. How often do you have a drink containing alcohol?: Never 9. Have you or someone else been injured as a result of your drinking?: No 10. Has a relative or friend or a doctor or another health worker been concerned about your drinking or suggested you cut down?: No Alcohol Use Disorder Identification Test Final Score (AUDIT): 0 Brief Intervention: Patient declined brief intervention Substance Abuse History in the last 12 months:  No. Consequences of Substance Abuse: NA Previous Psychotropic Medications: no Psychological Evaluations: no Past Medical History:  Past Medical History  Diagnosis Date  . Asthma   . Obesity   . Precocious puberty   . Prediabetes   . Diabetes mellitus without complication ([place])   . Trichomonal vaginitis   . Depression     doing good  . Chlamydia  Past Surgical History  Procedure Laterality Date  . Dilation and curettage of uterus N/A 11/09/2014    Procedure: DILATATION AND CURETTAGE;  Surgeon: Emily Filbert, MD;  Location: [facility];  Service: Gynecology;  Laterality: N/A;  . Dilation and curettage of uterus  2016   Family History:   Family History  Problem Relation Age of Onset  . Diabetes Mother   . Hypertension Mother   . Obesity Mother   . Fibroids Mother   . Cancer Mother     thyroid  . Obesity Father   . Obesity Maternal Aunt   . Fibroids Maternal Aunt   . Obesity Maternal Uncle   . Obesity Paternal Aunt   . Diabetes Maternal Grandmother   . Obesity Maternal Grandmother   . Hypertension Maternal Grandmother   . Diabetes Maternal Grandfather   . Obesity Maternal Grandfather   . Stroke Maternal Grandfather   . Heart disease Maternal Grandfather   . Hypertension Maternal Grandfather   . Obesity Paternal Grandfather   . Heart disease Paternal Grandfather   . Bipolar disorder Brother   . Bipolar disorder Brother    Family Psychiatric  History: See Above Tobacco Screening: '@FLOW'$ ([phone])::1)@ Social History:  History  Alcohol Use No     History  Drug Use No    Additional Social History:                           Allergies:  No Known Allergies Lab Results:  Results for orders placed or performed during the hospital encounter of 01/23/16 (from the past 48 hour(s))  Comprehensive metabolic panel     Status: Abnormal   Collection Time: 01/23/16  7:58 PM  Result Value Ref Range   Sodium 139 135 - 145 mmol/L   Potassium 3.5 3.5 - 5.1 mmol/L   Chloride 107 101 - 111 mmol/L   CO2 23 22 - 32 mmol/L   Glucose, Bld 85 65 - 99 mg/dL   BUN 7 6 - 20 mg/dL   Creatinine, Ser 0.52 0.44 - 1.00 mg/dL   Calcium 8.6 (L) 8.9 - 10.3 mg/dL   Total Protein 7.2 6.5 - 8.1 g/dL   Albumin 3.5 3.5 - 5.0 g/dL   AST 20 15 - 41 U/L   ALT 19 14 - 54 U/L   Alkaline Phosphatase 89 38 - 126 U/L   Total Bilirubin 0.3 0.3 - 1.2 mg/dL   GFR calc non Af Amer >60 >60 mL/min   GFR calc Af Amer >60 >60 mL/min    Comment: (NOTE) The eGFR has been calculated using the CKD EPI equation. This calculation has not been validated in all clinical situations. eGFR's persistently <60 mL/min signify possible Chronic  Kidney Disease.    Anion gap 9 5 - 15  Ethanol (ETOH)     Status: None   Collection Time: 01/23/16  7:58 PM  Result Value Ref Range   Alcohol, Ethyl (B) <5 <5 mg/dL    Comment:        LOWEST DETECTABLE LIMIT FOR SERUM ALCOHOL IS 5 mg/dL FOR MEDICAL PURPOSES ONLY   Salicylate level     Status: None   Collection Time: 01/23/16  7:58 PM  Result Value Ref Range   Salicylate Lvl <7.4 2.8 - 30.0 mg/dL  Acetaminophen level     Status: Abnormal   Collection Time: 01/23/16  7:58 PM  Result Value Ref Range   Acetaminophen (Tylenol), Serum <10 (L) 10 -  30 ug/mL    Comment:        THERAPEUTIC CONCENTRATIONS VARY SIGNIFICANTLY. A RANGE OF 10-30 ug/mL MAY BE AN EFFECTIVE CONCENTRATION FOR MANY PATIENTS. HOWEVER, SOME ARE BEST TREATED AT CONCENTRATIONS OUTSIDE THIS RANGE. ACETAMINOPHEN CONCENTRATIONS >150 ug/mL AT 4 HOURS AFTER INGESTION AND >50 ug/mL AT 12 HOURS AFTER INGESTION ARE OFTEN ASSOCIATED WITH TOXIC REACTIONS.   CBC     Status: Abnormal   Collection Time: 01/23/16  7:58 PM  Result Value Ref Range   WBC 7.0 4.0 - 10.5 K/uL   RBC 4.56 3.87 - 5.11 MIL/uL   Hemoglobin 10.8 (L) 12.0 - 15.0 g/dL   HCT 36.1 36.0 - 46.0 %   MCV 79.2 78.0 - 100.0 fL   MCH 23.7 (L) 26.0 - 34.0 pg   MCHC 29.9 (L) 30.0 - 36.0 g/dL   RDW 16.2 (H) 11.5 - 15.5 %   Platelets 411 (H) 150 - 400 K/uL  I-Stat beta hCG blood, ED ([facility], [facility], [facility] only)     Status: None   Collection Time: 01/23/16  8:04 PM  Result Value Ref Range   I-stat hCG, quantitative <5.0 <5 mIU/mL   Comment 3            Comment:   GEST. AGE      CONC.  (mIU/mL)   <=1 WEEK        5 - 50     2 WEEKS       50 - 500     3 WEEKS       100 - 10,000     4 WEEKS     1,000 - 30,000        FEMALE AND NON-PREGNANT FEMALE:     LESS THAN 5 mIU/mL   CBG monitoring, ED     Status: None   Collection Time: 01/24/16  1:46 AM  Result Value Ref Range   Glucose-Capillary 95 65 - 99 mg/dL    Blood Alcohol level:  Lab Results  Component Value  Date   ETH <5 01/23/2016   ETH <11 63/87/5643    Metabolic Disorder Labs:  Lab Results  Component Value Date   HGBA1C 5.6 05/28/2012   No results found for: PROLACTIN No results found for: CHOL, TRIG, HDL, CHOLHDL, VLDL, LDLCALC  Current Medications: Current Facility-Administered Medications  Medication Dose Route Frequency Provider Last Rate Last Dose  . acetaminophen (TYLENOL) tablet 650 mg  650 mg Oral Q6H PRN Laverle Hobby, PA-C      . alum & mag hydroxide-simeth (MAALOX/MYLANTA) 200-200-20 MG/5ML suspension 30 mL  30 mL Oral Q4H PRN Laverle Hobby, PA-C      . citalopram (CELEXA) tablet 10 mg  10 mg Oral Daily Derrill Center, NP   10 mg at 01/24/16 1020  . hydrOXYzine (ATARAX/VISTARIL) tablet 25 mg  25 mg Oral Q6H PRN Laverle Hobby, PA-C   25 mg at 01/24/16 1020  . magnesium hydroxide (MILK OF MAGNESIA) suspension 30 mL  30 mL Oral Daily PRN Laverle Hobby, PA-C      . naproxen (NAPROSYN) tablet 500 mg  500 mg Oral BID WC Hampton Abbot, MD   500 mg at 01/24/16 0817  . traZODone (DESYREL) tablet 50 mg  50 mg Oral QHS,MR X 1 Spencer E Simon, PA-C       PTA Medications: Prescriptions prior to admission  Medication Sig Dispense Refill Last Dose  . metoCLOPramide (REGLAN) 10 MG tablet Take 1 tablet (10 mg  total) by mouth every 6 (six) hours as needed for nausea (Headache. take with benadryl and Naprosyn). (Patient not taking: Reported on 01/23/2016) 20 tablet 0   . naproxen (NAPROSYN) 500 MG tablet Take 1 tablet (500 mg total) by mouth 2 (two) times daily. (Patient not taking: Reported on 01/23/2016) 30 tablet 0   . naproxen sodium (ANAPROX) 220 MG tablet Take 220 mg by mouth 2 (two) times daily as needed (pain).   Past Week at Unknown time    Musculoskeletal: Strength & Muscle Tone: within normal limits Gait & Station: normal Patient leans: N/A  Psychiatric Specialty Exam: Physical Exam  Nursing note and vitals reviewed. Constitutional: She is oriented to person, place,  and time. She appears well-developed.  HENT:  Head: Normocephalic.  Neck: Neck supple.  Cardiovascular: Normal rate.   Respiratory: Breath sounds normal.  Musculoskeletal: Normal range of motion.  Neurological: She is alert and oriented to person, place, and time.  Skin: Skin is warm and dry.  Psychiatric: She has a normal mood and affect. Her behavior is normal. Thought content normal.    Review of Systems  Psychiatric/Behavioral: Positive for depression. The patient is nervous/anxious.   All other systems reviewed and are negative.   Blood pressure 106/63, pulse 80, temperature 98.7 F (37.1 C), temperature source Oral, resp. rate 18, height '5\' 6"'$  (1.676 m), weight 116.574 kg (257 lb), last menstrual period 01/18/2016.Body mass index is 41.5 kg/(m^2).  General Appearance: Casual  Eye Contact::  Good  Speech:  Clear and Coherent  Volume:  Decreased  Mood:  Depressed  Affect:  Congruent  Thought Process:  Intact and Logical  Orientation:  Full (Time, Place, and Person)  Thought Content:  Hallucinations: None  Suicidal Thoughts:  No  Homicidal Thoughts:  No  Memory:  Immediate;   Fair Recent;   Fair Remote;   Fair  Judgement:  Fair  Insight:  Fair  Psychomotor Activity:  Normal  Concentration:  Fair  Recall:  [employer] of Knowledge:Fair  Language: Good  Akathisia:  No  Handed:  Right  AIMS (if indicated):     Assets:  Desire for Improvement Social Support  ADL's:  Intact  Cognition: WNL  Sleep:        I agree with current treatment plan on [id number], Patient seen face-to-face for psychiatric evaluation follow-up, chart reviewed and case discussed with the MD. [place] Reviewed the information documented and agree with the treatment plan.  Treatment Plan Summary: Daily contact with patient to assess and evaluate symptoms and progress in treatment and Medication management   Start Celexa 10 mg PO daily for depression/anxiety Start Vistaril 25 mg PO Q 6 PRN for  anxiety  Continue with Trazodone 50 mg x1 repeat for insomnia Will continue to monitor vitals ,medication compliance and treatment side effects while patient is here.  Reviewed labs: - BAL, CBC-recommend for up with PCP CSW/Counselor  will start working on disposition for out-patient follow-up.   Observation Level/Precautions:  Continuous Observation  Laboratory:  CBC Chemistry Profile UA  Psychotherapy:  Individual   Medications:  Celexa, vistaril and trazodone  Consultations:  As needed  Discharge Concerns:  Depression   Estimated LOS: Less than 48 hours  Other:     Derrill Center, NP 3/21/20172:08 PM   Case reviewed with me as above

## 2016-01-24 NOTE — Progress Notes (Signed)
This is a 20 years old African [place] female admitted to Observation unit for evaluation. Patient reported that she has been feeling very depressed for the past 3 months and has been missing work. She endorsed the following symptoms of depression, anhedonia, poor appetite, lack of adequate sleep, social isolation, sadness, feeling hopeless and worthless. She denied having any suicide thought but said people would be all right without her. She said she have not been able to clean her room due to the depressive feeling. She reported that she called and told her mother that she could not go on feeling that way. Patient denied SI/Hi and denied Hallucinations. Skin assessment within normal limit. She reported 2 prior history of miscarriages. Writer encouraged and supported patient.

## 2016-01-24 NOTE — Progress Notes (Signed)
D: Patient has been calm and quiet.  She was given vistaril for anxiety and started on an anti-depressant.  She states her depression today is 8/10.  She denies SI/HI/AVH.  She is quiet and presents with flat, blunted affect.  Her mood is sad and depressed.  She is compliant with her medications and is interacting well with staff. A: Continue to monitor medication management.  Patient's safety intact.  Offer support and encouragement as needed. R: Patient is receptive to staff; her behavior is appropriate.

## 2016-01-25 DIAGNOSIS — F331 Major depressive disorder, recurrent, moderate: Secondary | ICD-10-CM

## 2016-01-25 MED ORDER — HYDROXYZINE HCL 25 MG PO TABS: 25.0000 mg | ORAL_TABLET | Freq: Four times a day (QID) | ORAL | Status: DC | PRN

## 2016-01-25 MED ORDER — TRAZODONE HCL 50 MG PO TABS: 50.0000 mg | ORAL_TABLET | Freq: Every evening | ORAL | Status: DC | PRN

## 2016-01-25 MED ORDER — CITALOPRAM HYDROBROMIDE 10 MG PO TABS: 10.0000 mg | ORAL_TABLET | Freq: Every day | ORAL | Status: DC

## 2016-01-25 NOTE — Progress Notes (Signed)
D/C instructions/meds/follow-up appointments reviewed, pt verbalized understanding, pt's belongings returned to pt, denies SI/HI/AVH. 

## 2016-01-25 NOTE — Progress Notes (Signed)
Patient Identification: Jaime Ramos MRN: [id number] Chief Complaint: MDD SINGLE EPISODE Principal Diagnosis: Mood disorder of depressed type Pt on unit at shift change in bed.  Pt is quiet and cooperative and on the phone speaking softly.  Pt has flat affect and does not volunteer conversation. Pt asks if she has order for sleep medication. Pt given sleep medication Pt continuously monitored for safety while on unit except when in bathroom.

## 2016-01-25 NOTE — Discharge Instructions (Signed)
[facility]

## 2016-01-25 NOTE — Discharge Summary (Signed)
[facility] OBS UNIT DISCHARGE SUMMARY  Patient Identification: Jaime Ramos MRN:  [id number] Date of Evaluation:  01/25/2016 Chief Complaint:  MDD and suicidal ideation without plan  Principal Diagnosis: MDD (major depressive disorder), recurrent episode, moderate ([place]) Diagnosis:   Patient Active Problem List   Diagnosis Date Noted  . MDD (major depressive disorder), recurrent episode, moderate ([place]) [F33.1] 01/25/2016    Priority: High  . Chlamydia [A74.9] 04/27/2015  . Nexplanon removal [Z30.46] 02/09/2015  . Preterm delivery [O60.10X0] 10/10/2013  . Preterm labor [O60.10X0] 10/09/2013  . Depression [F32.9] 10/02/2013  . Vaginal bleeding before [redacted] weeks gestation [O20.9] 10/02/2013  . Acanthosis [L83] 05/28/2012  . Menorrhagia with regular cycle [N92.0] 07/31/2011  . Goiter [E04.9] 07/31/2011  . Prediabetes [R73.03]   . Obesity [E66.9] 05/17/2011   Subjective: Pt seen and chart reviewed. Pt is alert/oriented x4, calm, cooperative, and appropriate to situation. Pt denies suicidal/homicidal ideation and psychosis and does not appear to be responding to internal stimuli. Pt does report that she has been ruminative about her multiple miscarriages and that this has been a struggle for her. She would like to seek outpatient followup and continue the medications given to her in the [facility].   History of Present Illness:Jaime Ramos is a 20 y.o. female PMH significant for depression, diabetes, asthma presenting with a 3 month history of worsening depression. She endorses suicidal ideations, although she does not have a plan at this time. She states she has had a history of this before. She denies any triggers or precipitating events for feeling this way. She endorses of a mild frontal headache. She denies HI, hallucinations, fevers, chills, chest pain, shortness of breath, nausea, vomiting, change in bowel/ bladder habits.  On 01/24/16 :[name] is awake, alert and oriented X 4,  found resting in the observation unit.  Denies suicidal or homicidal ideation at this time. Patient reports increasing depression over the past 6 months to 1 year. Reports she has never been evaluated or treated for mental illness in the past.  Denies auditory or visual hallucination and does not appear to be responding to internal stimuli.  States her depression 5/10 today. Patient states "I  haven't been myself, some days I have a hard time and I just cry."  Patient reports she has a good support system with her mother. Reports fair appetite and is resting well. Support, encouragement and reassurance was provided.    Total Time spent with patient: 30 minutes  Past Psychiatric History: Depression Is the patient at risk to self? No.  Has the patient been a risk to self in the past 6 months? No.  Has the patient been a risk to self within the distant past? No.  Is the patient a risk to others? No.  Has the patient been a risk to others in the past 6 months? No.  Has the patient been a risk to others within the distant past? No.   Prior Inpatient Therapy:   Prior Outpatient Therapy:    Alcohol Screening: 1. How often do you have a drink containing alcohol?: Never 9. Have you or someone else been injured as a result of your drinking?: No 10. Has a relative or friend or a doctor or another health worker been concerned about your drinking or suggested you cut down?: No Alcohol Use Disorder Identification Test Final Score (AUDIT): 0 Brief Intervention: Patient declined brief intervention Substance Abuse History in the last 12 months:  No. Consequences of Substance Abuse: NA Previous Psychotropic  Medications: no Psychological Evaluations: no Past Medical History:  Past Medical History  Diagnosis Date  . Asthma   . Obesity   . Precocious puberty   . Prediabetes   . Diabetes mellitus without complication ([place])   . Trichomonal vaginitis   . Depression     doing good  . Chlamydia     Past  Surgical History  Procedure Laterality Date  . Dilation and curettage of uterus N/A 11/09/2014    Procedure: DILATATION AND CURETTAGE;  Surgeon: Emily Filbert, MD;  Location: [facility];  Service: Gynecology;  Laterality: N/A;  . Dilation and curettage of uterus  2016   Family History:  Family History  Problem Relation Age of Onset  . Diabetes Mother   . Hypertension Mother   . Obesity Mother   . Fibroids Mother   . Cancer Mother     thyroid  . Obesity Father   . Obesity Maternal Aunt   . Fibroids Maternal Aunt   . Obesity Maternal Uncle   . Obesity Paternal Aunt   . Diabetes Maternal Grandmother   . Obesity Maternal Grandmother   . Hypertension Maternal Grandmother   . Diabetes Maternal Grandfather   . Obesity Maternal Grandfather   . Stroke Maternal Grandfather   . Heart disease Maternal Grandfather   . Hypertension Maternal Grandfather   . Obesity Paternal Grandfather   . Heart disease Paternal Grandfather   . Bipolar disorder Brother   . Bipolar disorder Brother    Family Psychiatric  History: See Above Tobacco Screening: _0 [phone])::1)@ Social History:  History  Alcohol Use No     History  Drug Use No    Additional Social History:                           Allergies:  No Known Allergies Lab Results:  Results for orders placed or performed during the hospital encounter of 01/23/16 (from the past 48 hour(s))  Comprehensive metabolic panel     Status: Abnormal   Collection Time: 01/23/16  7:58 PM  Result Value Ref Range   Sodium 139 135 - 145 mmol/L   Potassium 3.5 3.5 - 5.1 mmol/L   Chloride 107 101 - 111 mmol/L   CO2 23 22 - 32 mmol/L   Glucose, Bld 85 65 - 99 mg/dL   BUN 7 6 - 20 mg/dL   Creatinine, Ser 0.52 0.44 - 1.00 mg/dL   Calcium 8.6 (L) 8.9 - 10.3 mg/dL   Total Protein 7.2 6.5 - 8.1 g/dL   Albumin 3.5 3.5 - 5.0 g/dL   AST 20 15 - 41 U/L   ALT 19 14 - 54 U/L   Alkaline Phosphatase 89 38 - 126 U/L   Total Bilirubin 0.3 0.3 - 1.2  mg/dL   GFR calc non Af Amer >60 >60 mL/min   GFR calc Af Amer >60 >60 mL/min    Comment: (NOTE) The eGFR has been calculated using the CKD EPI equation. This calculation has not been validated in all clinical situations. eGFR's persistently <60 mL/min signify possible Chronic Kidney Disease.    Anion gap 9 5 - 15  Ethanol (ETOH)     Status: None   Collection Time: 01/23/16  7:58 PM  Result Value Ref Range   Alcohol, Ethyl (B) <5 <5 mg/dL    Comment:        LOWEST DETECTABLE LIMIT FOR SERUM ALCOHOL IS 5 mg/dL FOR MEDICAL PURPOSES ONLY  Salicylate level     Status: None   Collection Time: 01/23/16  7:58 PM  Result Value Ref Range   Salicylate Lvl <6.3 2.8 - 30.0 mg/dL  Acetaminophen level     Status: Abnormal   Collection Time: 01/23/16  7:58 PM  Result Value Ref Range   Acetaminophen (Tylenol), Serum <10 (L) 10 - 30 ug/mL    Comment:        THERAPEUTIC CONCENTRATIONS VARY SIGNIFICANTLY. A RANGE OF 10-30 ug/mL MAY BE AN EFFECTIVE CONCENTRATION FOR MANY PATIENTS. HOWEVER, SOME ARE BEST TREATED AT CONCENTRATIONS OUTSIDE THIS RANGE. ACETAMINOPHEN CONCENTRATIONS >150 ug/mL AT 4 HOURS AFTER INGESTION AND >50 ug/mL AT 12 HOURS AFTER INGESTION ARE OFTEN ASSOCIATED WITH TOXIC REACTIONS.   CBC     Status: Abnormal   Collection Time: 01/23/16  7:58 PM  Result Value Ref Range   WBC 7.0 4.0 - 10.5 K/uL   RBC 4.56 3.87 - 5.11 MIL/uL   Hemoglobin 10.8 (L) 12.0 - 15.0 g/dL   HCT 36.1 36.0 - 46.0 %   MCV 79.2 78.0 - 100.0 fL   MCH 23.7 (L) 26.0 - 34.0 pg   MCHC 29.9 (L) 30.0 - 36.0 g/dL   RDW 16.2 (H) 11.5 - 15.5 %   Platelets 411 (H) 150 - 400 K/uL  I-Stat beta hCG blood, ED ([facility], [facility], [facility] only)     Status: None   Collection Time: 01/23/16  8:04 PM  Result Value Ref Range   I-stat hCG, quantitative <5.0 <5 mIU/mL   Comment 3            Comment:   GEST. AGE      CONC.  (mIU/mL)   <=1 WEEK        5 - 50     2 WEEKS       50 - 500     3 WEEKS       100 - 10,000     4 WEEKS      1,000 - 30,000        FEMALE AND NON-PREGNANT FEMALE:     LESS THAN 5 mIU/mL   CBG monitoring, ED     Status: None   Collection Time: 01/24/16  1:46 AM  Result Value Ref Range   Glucose-Capillary 95 65 - 99 mg/dL    Blood Alcohol level:  Lab Results  Component Value Date   ETH <5 01/23/2016   ETH <11 01/60/1093    Metabolic Disorder Labs:  Lab Results  Component Value Date   HGBA1C 5.6 05/28/2012   No results found for: PROLACTIN No results found for: CHOL, TRIG, HDL, CHOLHDL, VLDL, LDLCALC  Current Medications: Current Facility-Administered Medications  Medication Dose Route Frequency Provider Last Rate Last Dose  . acetaminophen (TYLENOL) tablet 650 mg  650 mg Oral Q6H PRN Laverle Hobby, PA-C      . alum & mag hydroxide-simeth (MAALOX/MYLANTA) 200-200-20 MG/5ML suspension 30 mL  30 mL Oral Q4H PRN Laverle Hobby, PA-C      . citalopram (CELEXA) tablet 10 mg  10 mg Oral Daily Derrill Center, NP   10 mg at 01/25/16 0813  . hydrOXYzine (ATARAX/VISTARIL) tablet 25 mg  25 mg Oral Q6H PRN Laverle Hobby, PA-C   25 mg at 01/24/16 1020  . magnesium hydroxide (MILK OF MAGNESIA) suspension 30 mL  30 mL Oral Daily PRN Laverle Hobby, PA-C      . naproxen (NAPROSYN) tablet 500 mg  500 mg Oral  BID WC Hampton Abbot, MD   500 mg at 01/25/16 4098  . traZODone (DESYREL) tablet 50 mg  50 mg Oral QHS,MR X 1 Laverle Hobby, PA-C   50 mg at 01/24/16 2155   PTA Medications: Prescriptions prior to admission  Medication Sig Dispense Refill Last Dose  . metoCLOPramide (REGLAN) 10 MG tablet Take 1 tablet (10 mg total) by mouth every 6 (six) hours as needed for nausea (Headache. take with benadryl and Naprosyn). (Patient not taking: Reported on 01/23/2016) 20 tablet 0   . naproxen (NAPROSYN) 500 MG tablet Take 1 tablet (500 mg total) by mouth 2 (two) times daily. (Patient not taking: Reported on 01/23/2016) 30 tablet 0   . naproxen sodium (ANAPROX) 220 MG tablet Take 220 mg by mouth 2 (two)  times daily as needed (pain).   Past Week at Unknown time    Musculoskeletal: Strength & Muscle Tone: within normal limits Gait & Station: normal Patient leans: N/A  Psychiatric Specialty Exam: Physical Exam  Nursing note and vitals reviewed.   Review of Systems  Psychiatric/Behavioral: Positive for depression. Negative for suicidal ideas, hallucinations and substance abuse. The patient is nervous/anxious and has insomnia.   All other systems reviewed and are negative.   Blood pressure 116/59, pulse 74, temperature 98.5 F (36.9 C), temperature source Oral, resp. rate 16, height 5' 6" (1.676 m), weight 116.574 kg (257 lb), last menstrual period 01/18/2016, SpO2 100 %.Body mass index is 41.5 kg/(m^2).  General Appearance: Casual and Fairly Groomed  Engineer, water::  Good  Speech:  Clear and Coherent  Volume:  Normal  Mood:  Depressed  Affect:  Appropriate and Congruent  Thought Process:  Coherent, Goal Directed and Logical  Orientation:  Full (Time, Place, and Person)  Thought Content:  Hallucinations: None symptoms worries concerns  Suicidal Thoughts:  No  Homicidal Thoughts:  No  Memory:  Immediate;   Fair Recent;   Fair Remote;   Fair  Judgement:  Fair  Insight:  Fair  Psychomotor Activity:  Normal  Concentration:  Fair  Recall:  [employer] of Knowledge:Fair  Language: Good  Akathisia:  No  Handed:  Right  AIMS (if indicated):     Assets:  Desire for Improvement Social Support  ADL's:  Intact  Cognition: WNL  Sleep:      Treatment Plan Summary: MDD (major depressive disorder), recurrent episode, moderate ([place]), improving, stable for discharge and outpatient management for psychiatry/counseling services.   Continue Celexa 78m PO daily for MDD  Continue Vistaril 258mPO q6h prn anxiety Continue Trazodone 5097mO qhs prn insomnia   Disposition: -Discharge home -Outpatient resources given for psychiatry/counseling -Rx given for Celexa, Vistaril, and Trazodone 14  day supply   WitBenjamine MolaNP 3/22/201710:48 AM

## 2016-01-28 IMAGING — US US OB TRANSVAGINAL
1 series · 14 of 27 positions shown · non-contrast
Comparison: 11/03/2014 at [HOSPITAL] OBGYN. There is no report
available for that examination. 10/28/2014 at [HOSPITAL].

CLINICAL DATA: Threatened miscarriage in early pregnancy. Ten weeks
and 2 days pregnant by last menstrual period and 9 weeks and 3 days
pregnant by previous ultrasound. Scheduled for dilatation and
evacuation on 11/09/2014.

EXAM:
TRANSVAGINAL OB ULTRASOUND
TECHNIQUE: Transvaginal ultrasound was performed for complete evaluation of the
gestation as well as the maternal uterus, adnexal regions, and
pelvic cul-de-sac.

[Series 1: us ob comp less 14 wks · 14 of 27 slices shown]
[im 1/27]
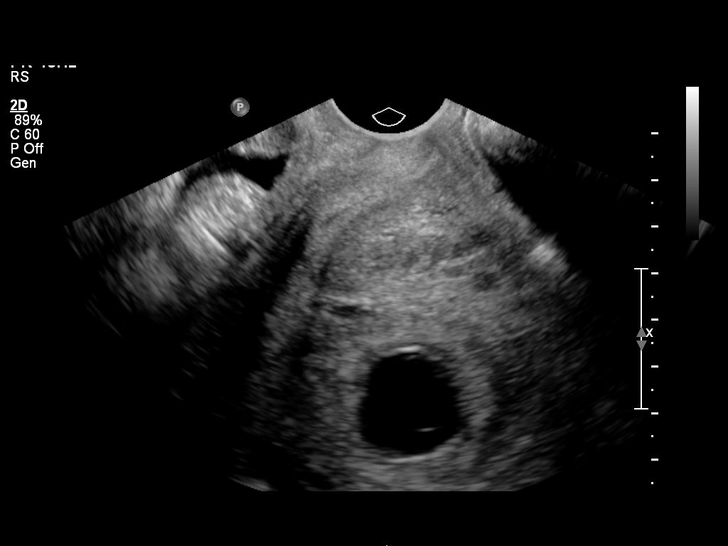
[im 3/27]
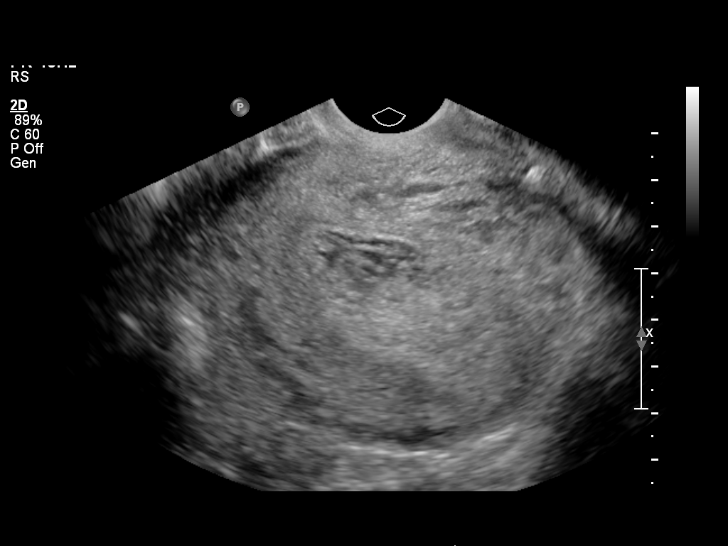
[im 5/27]
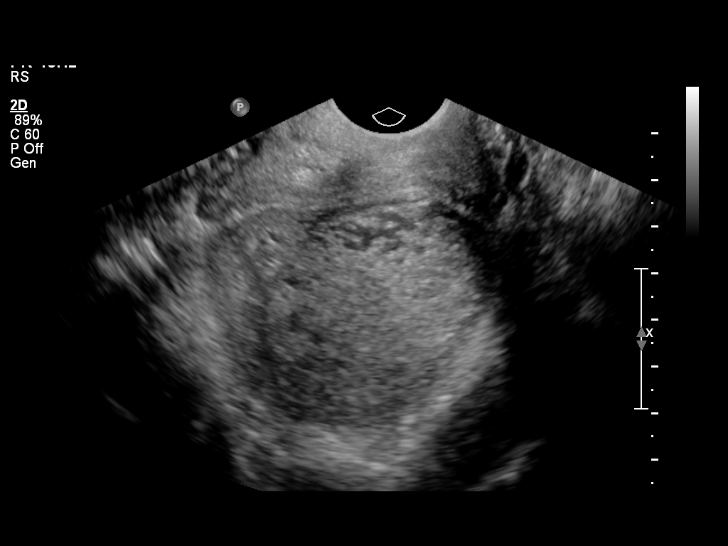
[im 7/27]
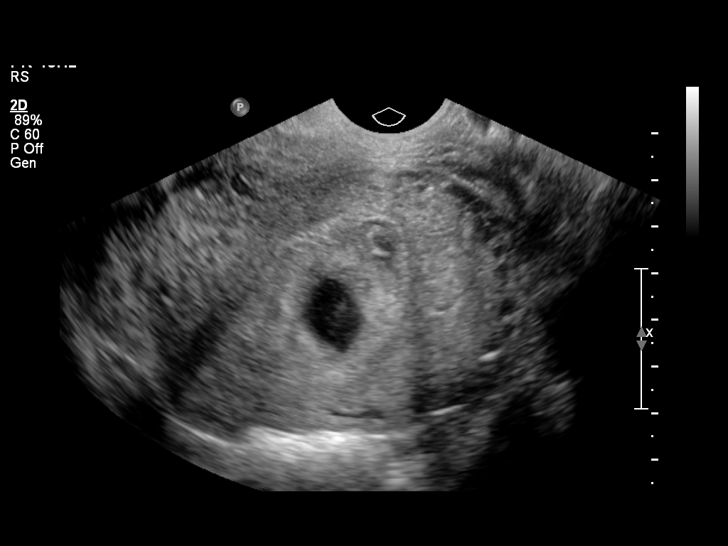
[im 9/27]
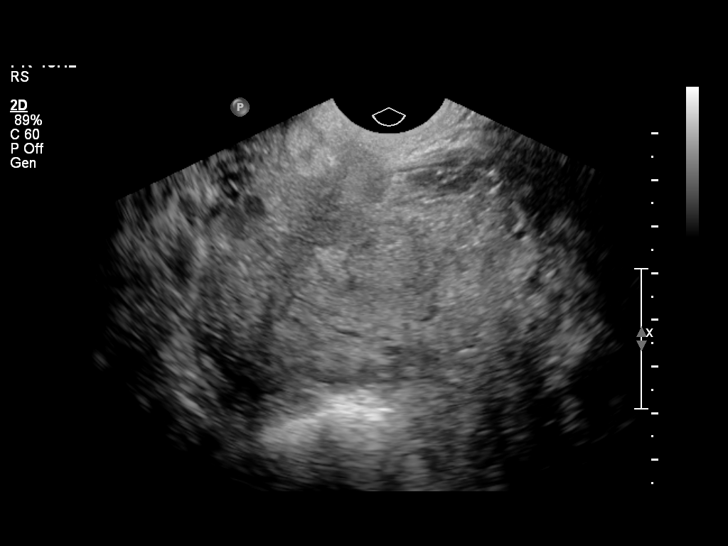
[im 11/27]
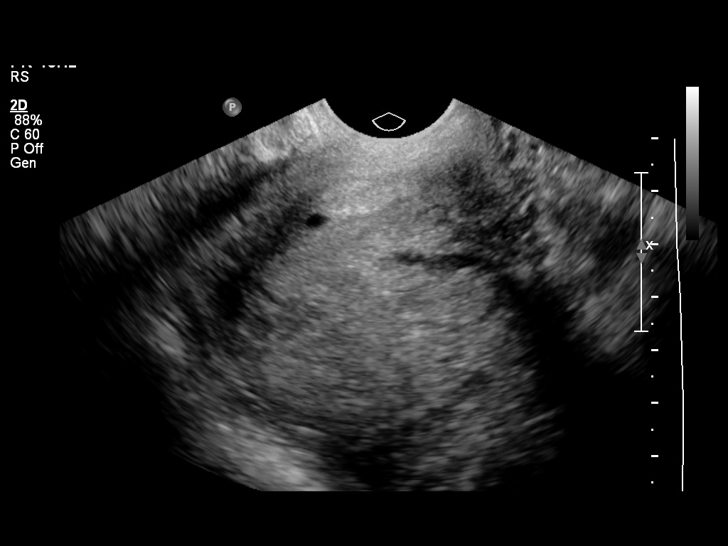
[im 13/27]
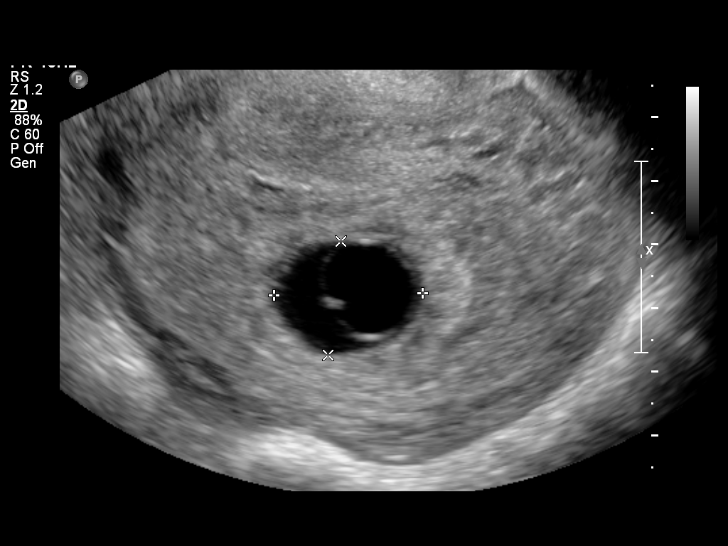
[im 15/27]
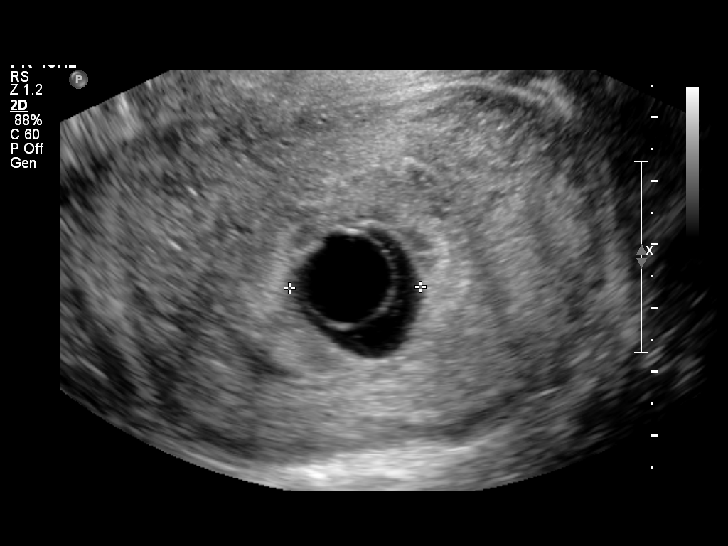
[im 17/27]
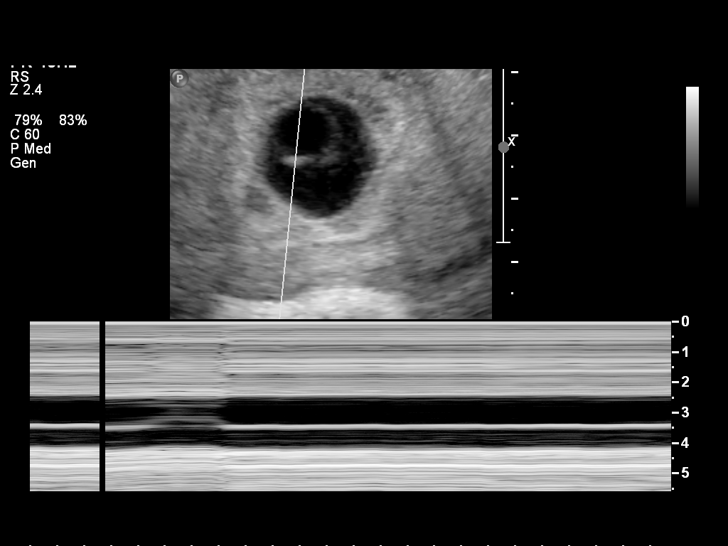
[im 19/27]
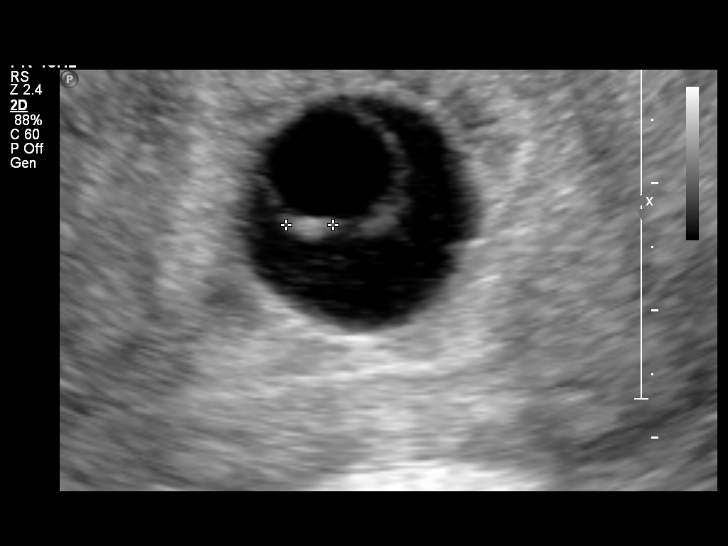
[im 21/27]
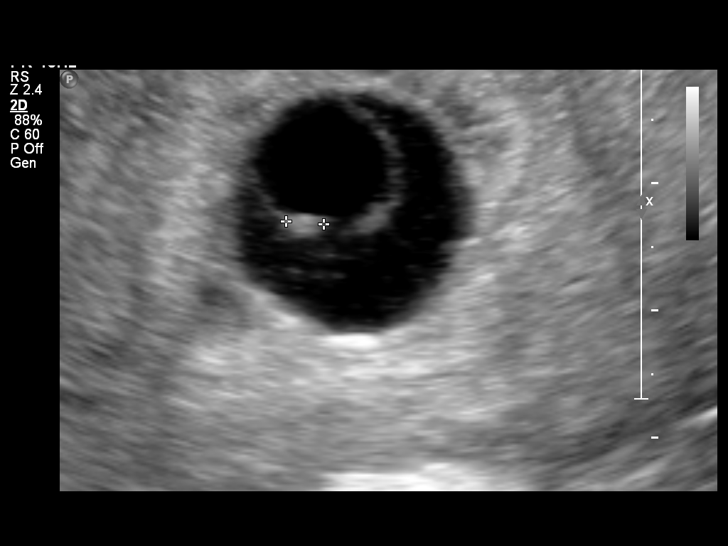
[im 23/27]
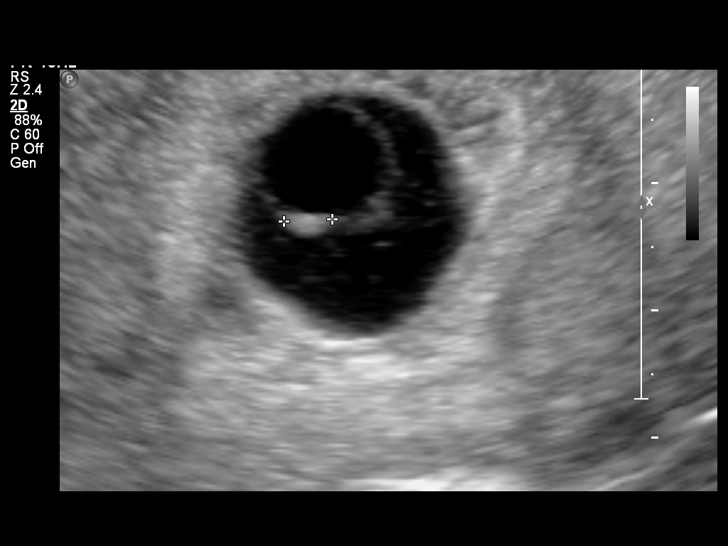
[im 25/27]
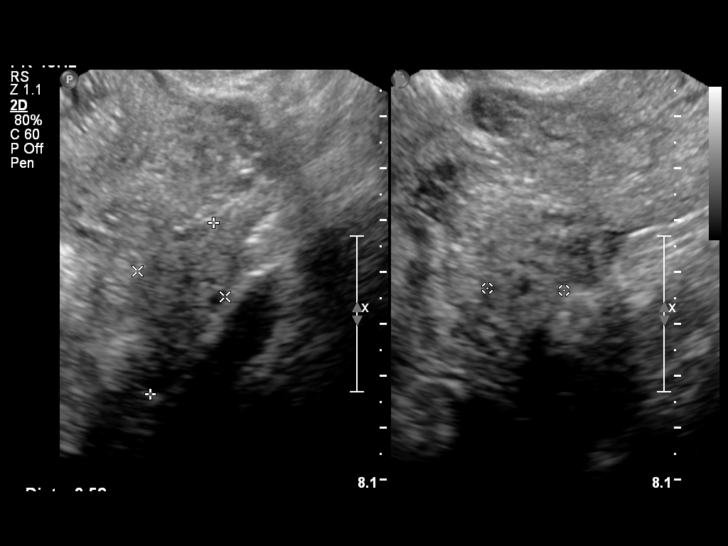
[im 27/27]
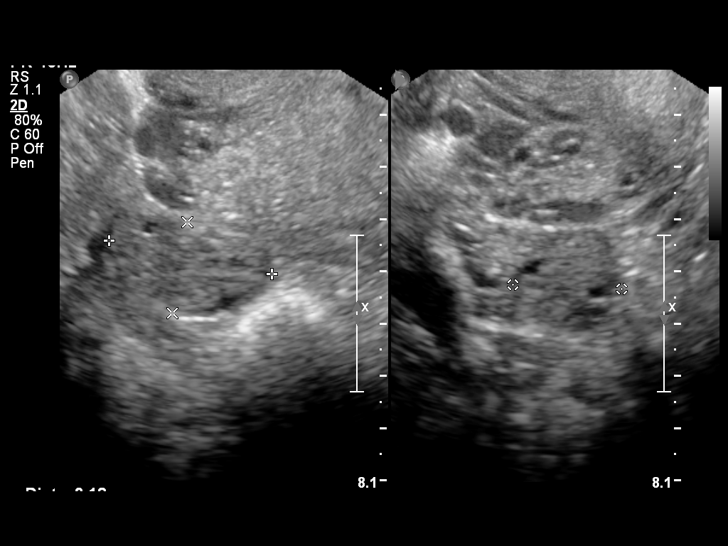

[14 of 27 positions shown; findings below may reference images not displayed]

FINDINGS: Intrauterine gestational sac: Visualized/normal in shape.

Yolk sac:  Not visualized.

Embryo:  Visualized

Cardiac Activity: Not visualized

MSD: 21  mm   7 w   1  d

CRL:   3.5  mm   6 w 0 d

Maternal uterus/adnexae: No subchorionic hemorrhage. Normal
appearing ovaries. No free peritoneal fluid.
IMPRESSION: Fetal demise at 6 weeks and 0 days of pregnancy.

## 2016-02-10 IMAGING — US US TRANSVAGINAL NON-OB
1 series · 13 of 25 positions shown · non-contrast
Comparison: None

CLINICAL DATA: Recent D&C, with severe pelvic pain and vaginal
bleeding. Initial encounter.

EXAM:
TRANSABDOMINAL AND TRANSVAGINAL ULTRASOUND OF PELVIS
TECHNIQUE: Both transabdominal and transvaginal ultrasound examinations of the
pelvis were performed. Transabdominal technique was performed for
global imaging of the pelvis including uterus, ovaries, adnexal
regions, and pelvic cul-de-sac. It was necessary to proceed with
endovaginal exam following the transabdominal exam to visualize the
uterus and ovaries in greater detail.

[Series 1: us transvaginal non-ob · 53 acquisitions, 13 frames shown]
[im 1/53]
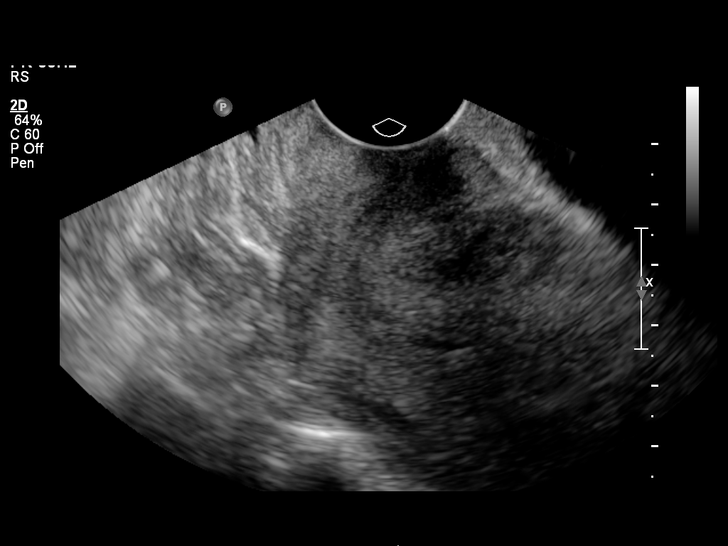
[im 5/53]
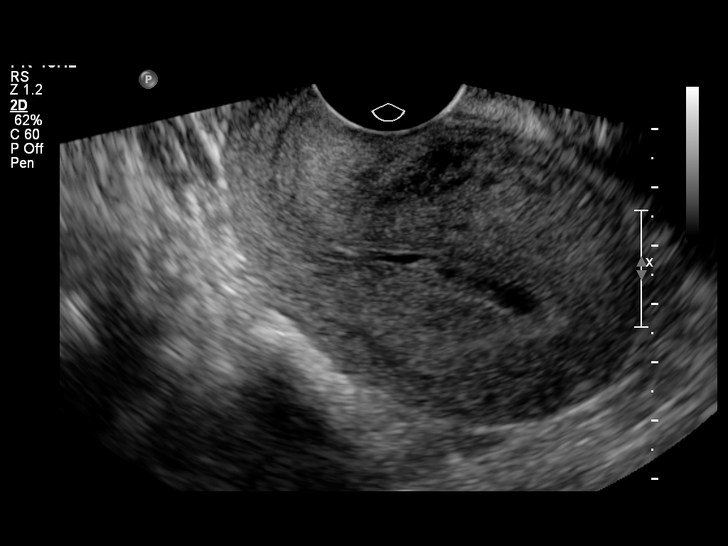
[im 9/53]
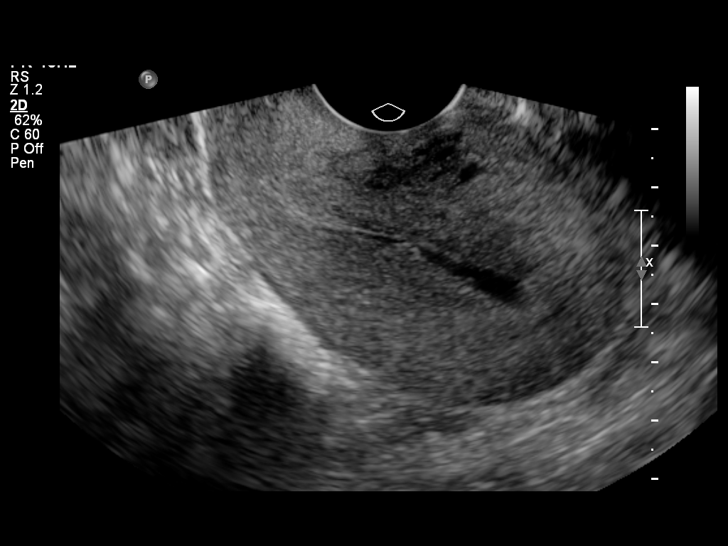
[im 14/53]
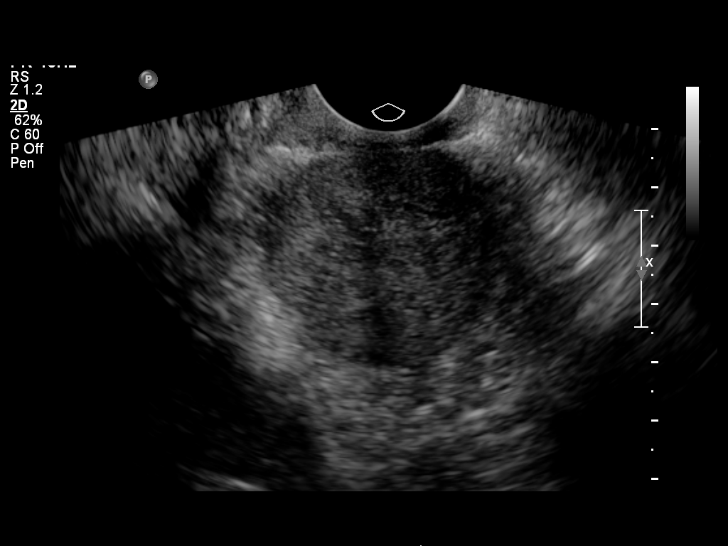
[im 18/53]
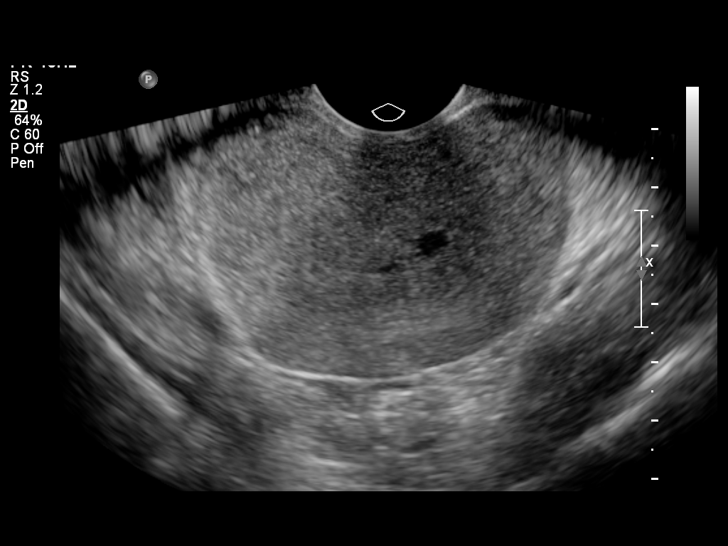
[im 22/53]
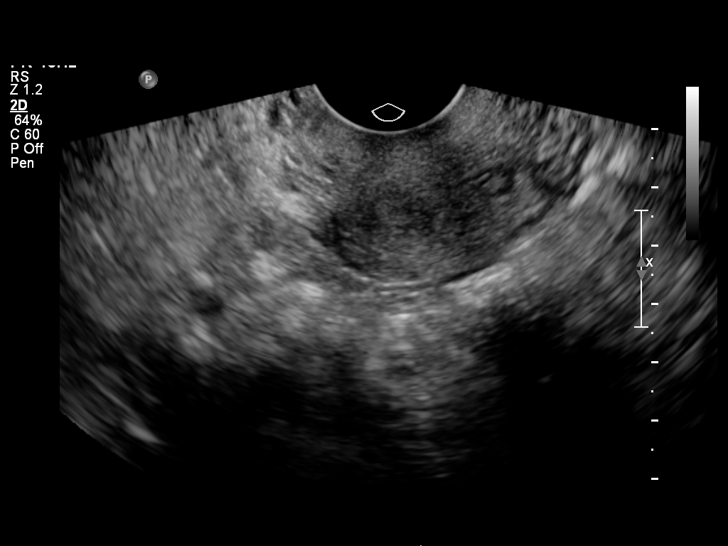
[im 27/53]
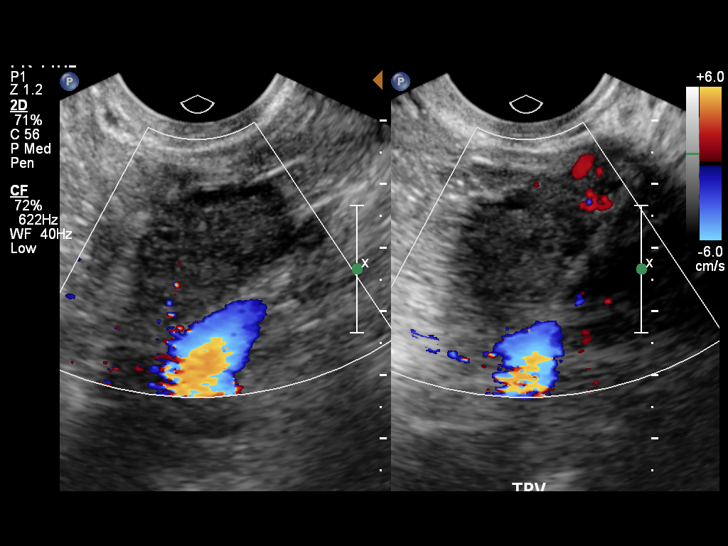
[im 31/53]
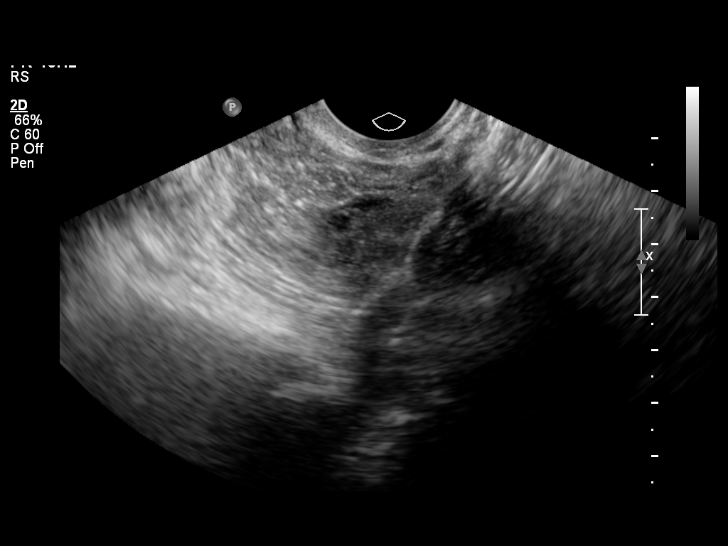
[im 35/53]
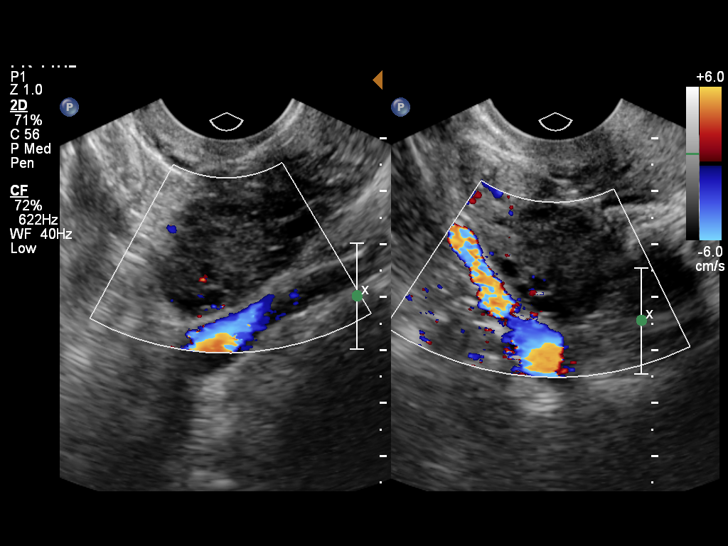
[im 40/53]
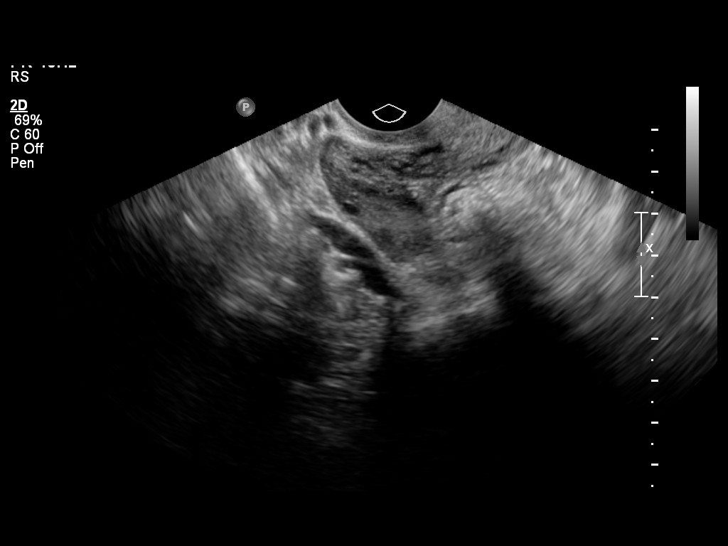
[im 44/53]
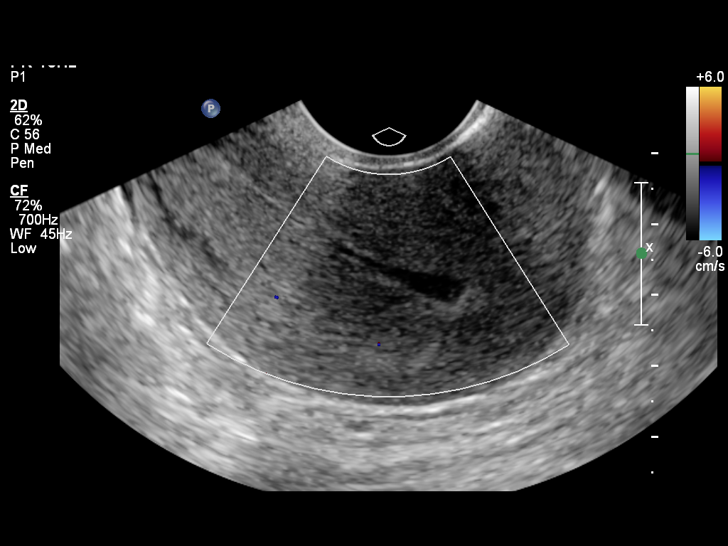
[im 48/53]
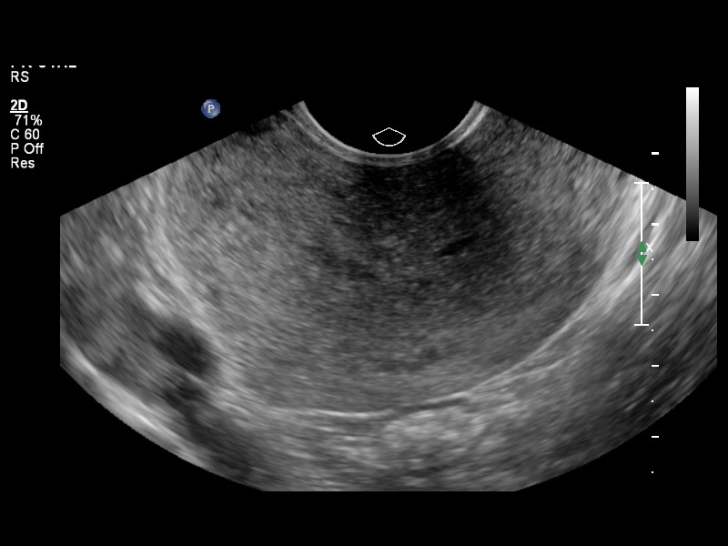
[im 53/53]
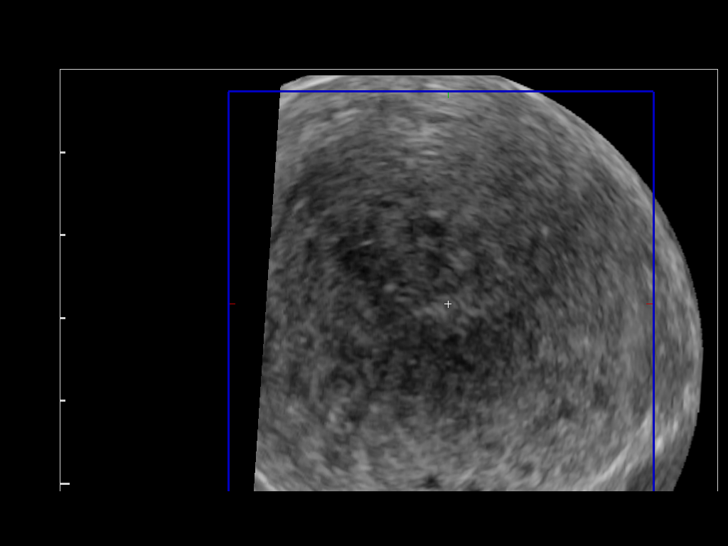

[13 of 25 positions shown; findings below may reference images not displayed]

FINDINGS: Uterus

Measurements: 7.8 x 4.8 x 6.1 cm. No fibroids or other mass
visualized.

Endometrium

Thickness: 0.8 cm. Trace fluid is noted within the endometrial
canal, with question of minimal associated isoechoic clot. No
evidence of retained products of conception.

Right ovary

Measurements: 3.7 x 2.1 x 2.3 cm. Normal appearance/no adnexal mass.

Left ovary

Measurements: 3.2 x 1.7 x 2.2 cm. Normal appearance/no adnexal mass.

Other findings

No free fluid seen within the pelvic cul-de-sac.
IMPRESSION: 1. Trace fluid within the endometrial canal, with question of
minimal associated clot. No evidence of retained products of
conception.
2. Otherwise unremarkable pelvic ultrasound.

## 2016-02-12 IMAGING — CT CT ABD-PELV W/ CM
1 of 2 series · 15 of 32 positions shown, 19 images · IV contrast (100 ML OMNI 300)
Comparison: Pelvic ultrasound performed 11/17/2014

CLINICAL DATA: Acute onset of diffuse abdominal pain and fever.
Leukocytosis. Initial encounter.

EXAM:
CT ABDOMEN AND PELVIS WITH CONTRAST
TECHNIQUE: Multidetector CT imaging of the abdomen and pelvis was performed
using the standard protocol following bolus administration of
intravenous contrast.
CONTRAST:  100mL OMNIPAQUE IOHEXOL 300 MG/ML  SOLN

[Series 2: abd/pel with · axial · 0.74mm/px · z∈[+1071,+1521]mm · 15 of 98 slices shown, 19 images]
[im 4/98  soft-tissue]
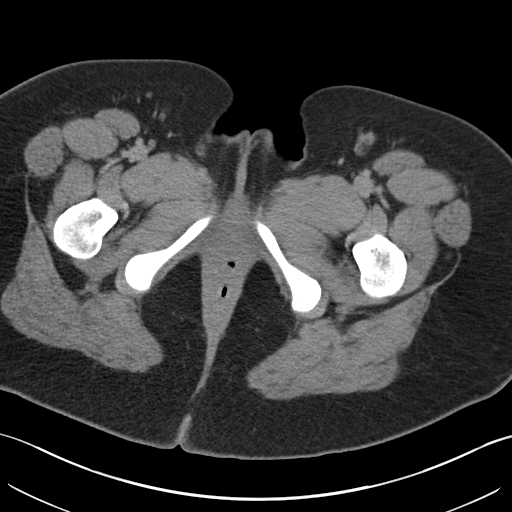
[im 4/98  bone]
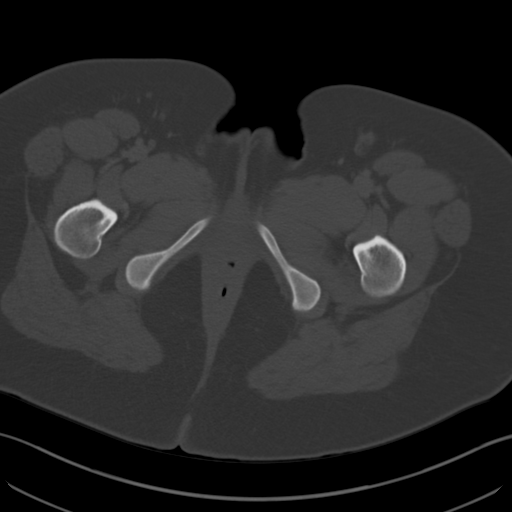
[im 12/98  soft-tissue]
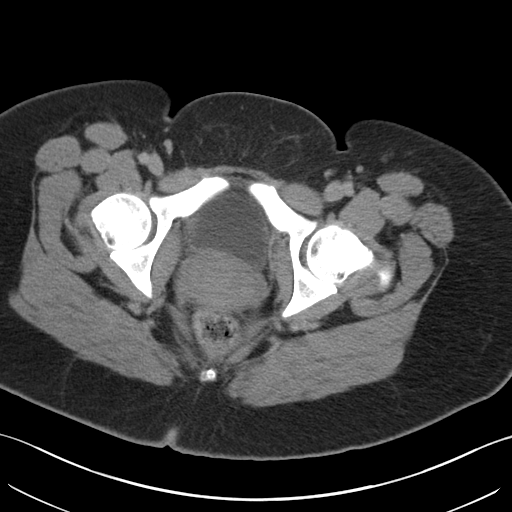
[im 20/98  soft-tissue]
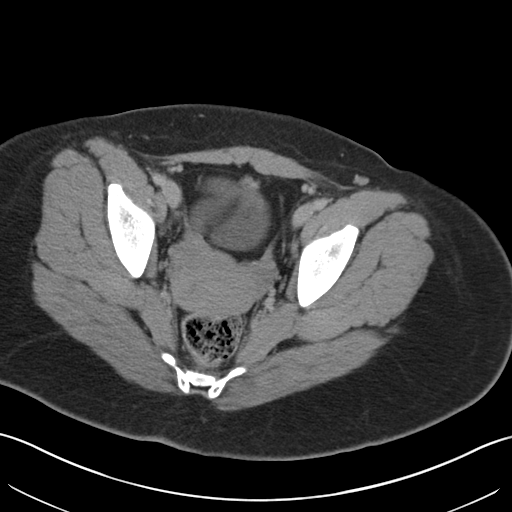
[im 28/98  soft-tissue]
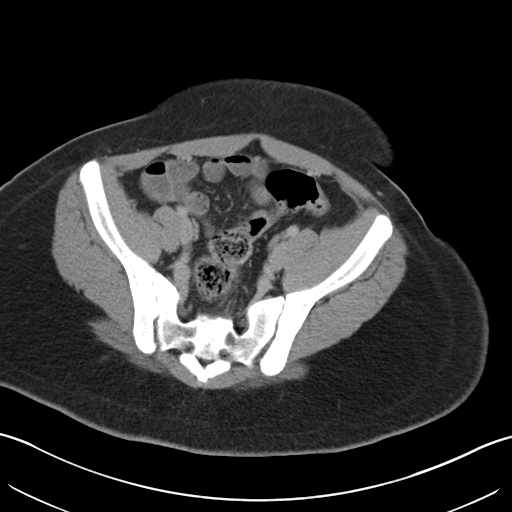
[im 35/98  soft-tissue]
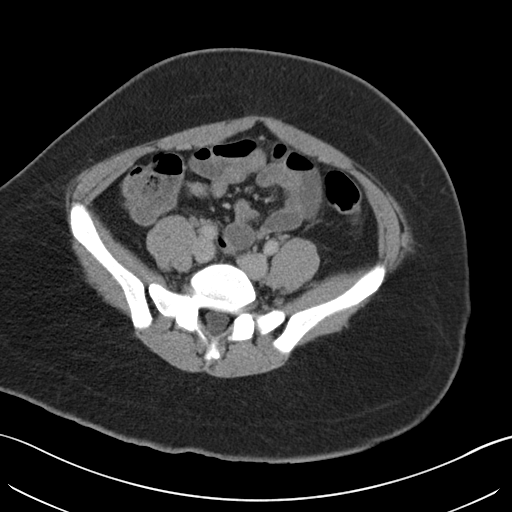
[im 43/98  soft-tissue]
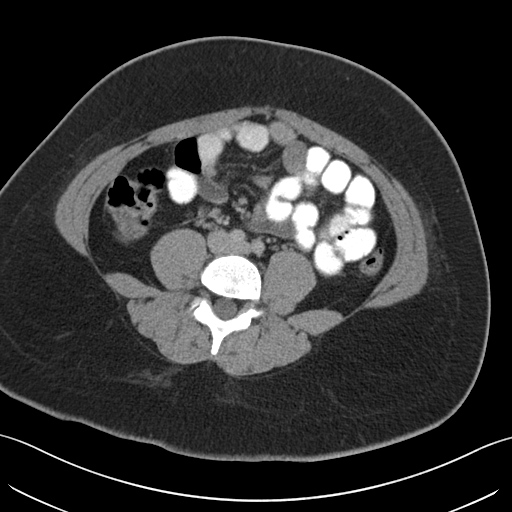
[im 51/98  soft-tissue]
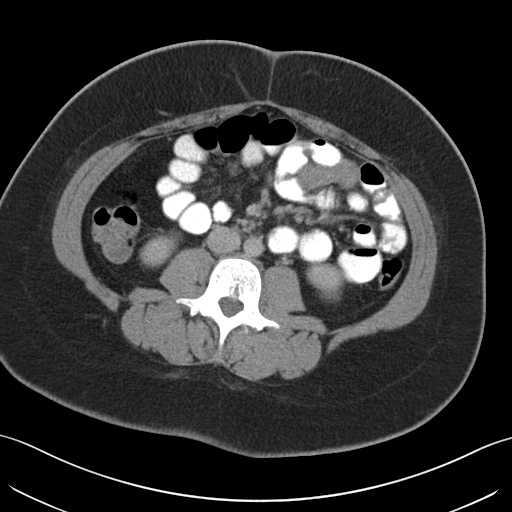
[im 55/98  soft-tissue]
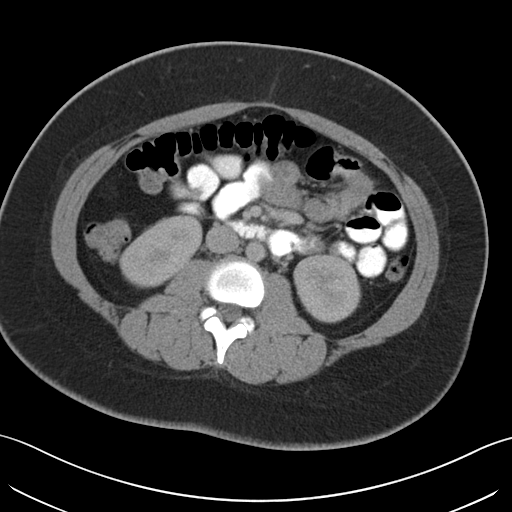
[im 63/98  soft-tissue]
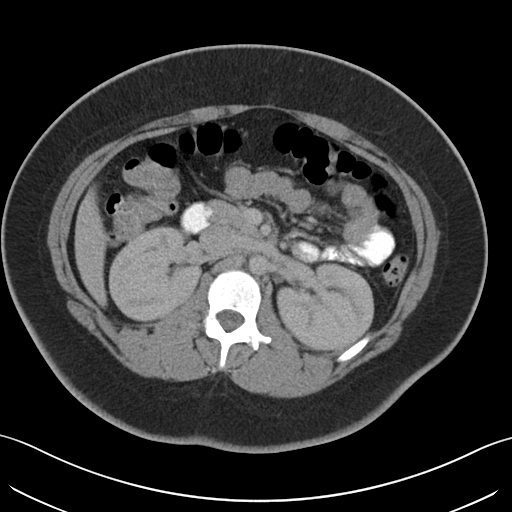
[im 63/98  bone]
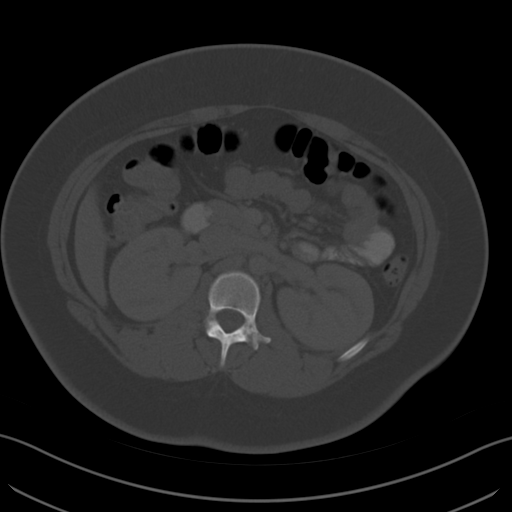
[im 70/98  soft-tissue]
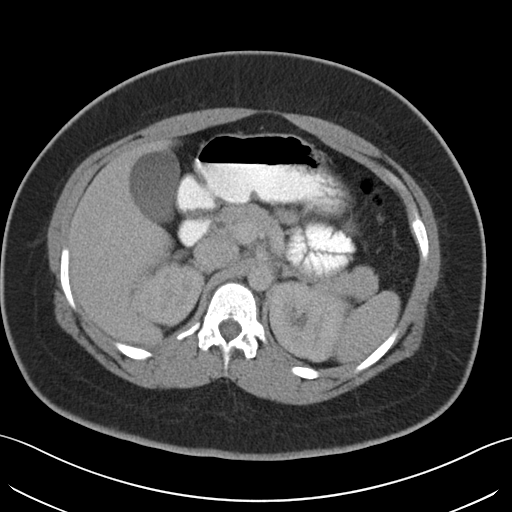
[im 78/98  soft-tissue]
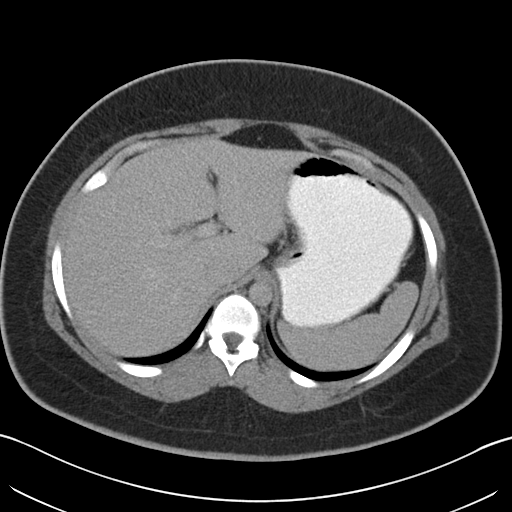
[im 82/98  lung]
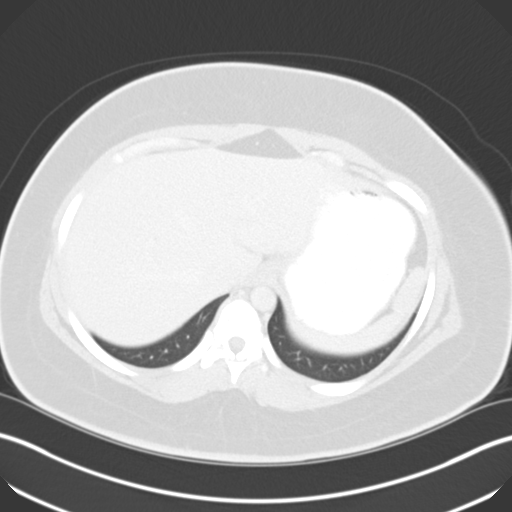
[im 86/98  soft-tissue]
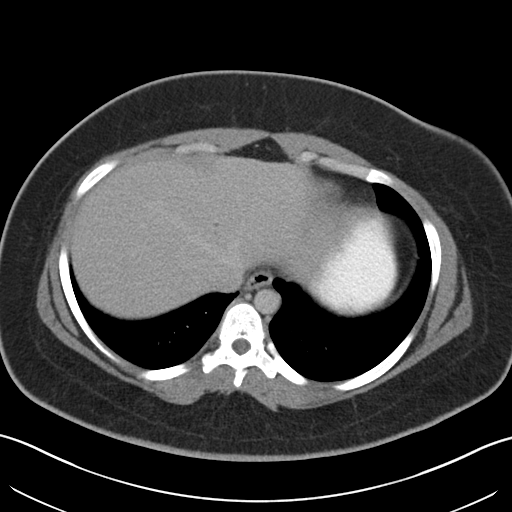
[im 86/98  lung]
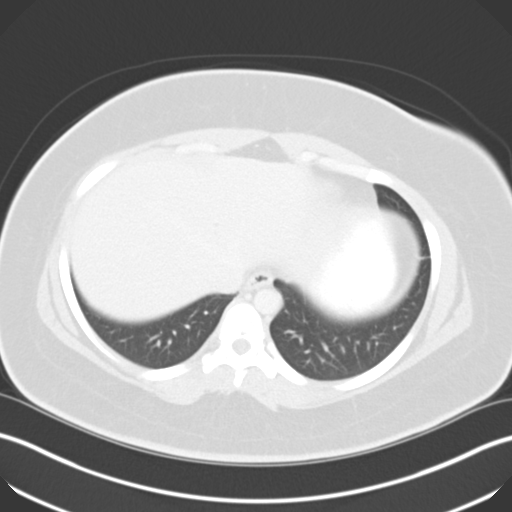
[im 90/98  lung]
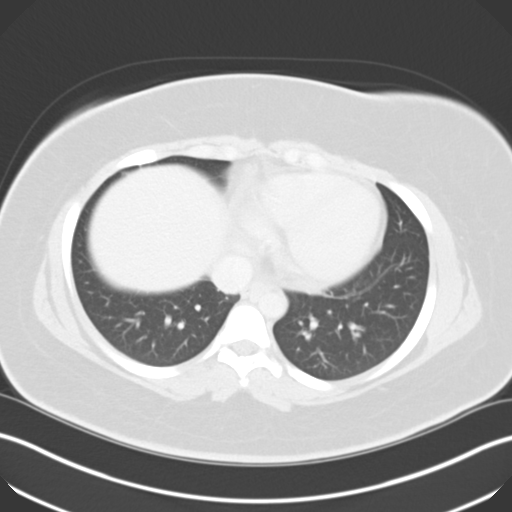
[im 94/98  soft-tissue]
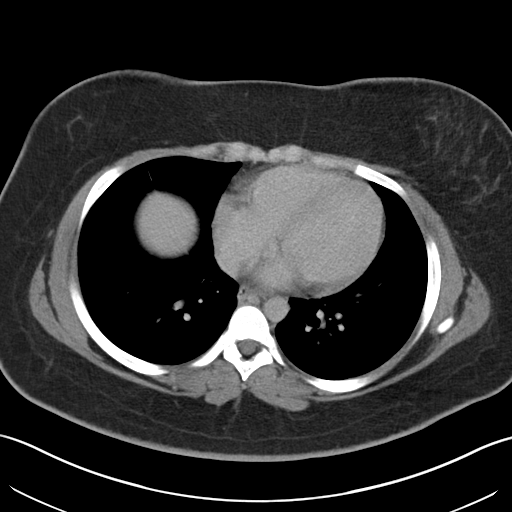
[im 94/98  lung]
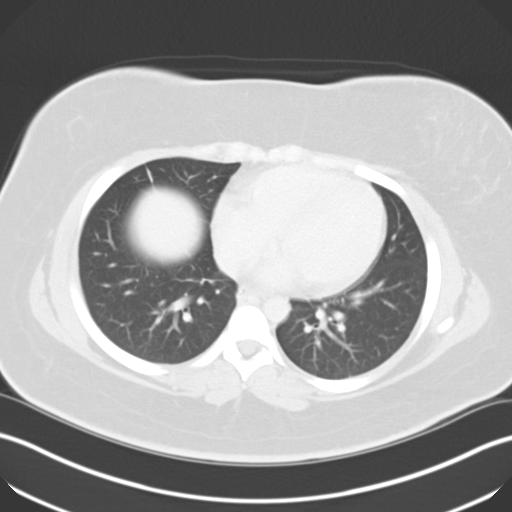

[15 of 32 positions shown; findings below may reference images not displayed]

FINDINGS: The visualized lung bases are clear.

A few tiny hypodensities in the liver are too small to further
characterize. The spleen is unremarkable. The gallbladder is within
normal limits. The pancreas and adrenal glands are unremarkable.

A 1.1 cm cyst is noted at the interpole region of the right kidney.
The kidneys are otherwise unremarkable. There is no evidence of
hydronephrosis. No renal or ureteral stones are seen. No perinephric
stranding is appreciated.

No free fluid is identified. The small bowel is unremarkable in
appearance. The stomach is within normal limits. No acute vascular
abnormalities are seen.

Mildly prominent mesenteric nodes could reflect mild mesenteric
adenitis.

The appendix is normal in caliber and contains air, without evidence
for appendicitis. The colon is unremarkable in appearance.

The bladder is mildly distended and grossly unremarkable. A small
urachal remnant is incidentally seen. The uterus is grossly
unremarkable in appearance. The ovaries are grossly symmetric. No
suspicious adnexal masses are seen. No inguinal lymphadenopathy is
seen.

No acute osseous abnormalities are identified.
IMPRESSION: 1. Mildly prominent mesenteric nodes could reflect mild mesenteric
adenitis. No evidence of appendicitis.
2. Tiny nonspecific hypodensities within the liver, too small to
further characterize but likely benign given the patient's age.
3. Small right renal cyst noted.

## 2016-03-18 ENCOUNTER — Emergency Department (HOSPITAL_COMMUNITY)
Admission: EM | Admit: 2016-03-18 | Discharge: 2016-03-18 | Disposition: A | Discharge status: Home / Self Care | Payer: Medicaid Other | Attending: Emergency Medicine | Admitting: Emergency Medicine

## 2016-03-18 ENCOUNTER — Encounter (HOSPITAL_COMMUNITY): Payer: Self-pay | Admitting: *Deleted

## 2016-03-18 DIAGNOSIS — E669 Obesity, unspecified: Secondary | ICD-10-CM | POA: Insufficient documentation

## 2016-03-18 DIAGNOSIS — J029 Acute pharyngitis, unspecified: Secondary | ICD-10-CM

## 2016-03-18 DIAGNOSIS — J45909 Unspecified asthma, uncomplicated: Secondary | ICD-10-CM | POA: Insufficient documentation

## 2016-03-18 DIAGNOSIS — F329 Major depressive disorder, single episode, unspecified: Secondary | ICD-10-CM | POA: Diagnosis not present

## 2016-03-18 DIAGNOSIS — E119 Type 2 diabetes mellitus without complications: Secondary | ICD-10-CM | POA: Insufficient documentation

## 2016-03-18 LAB — RAPID STREP SCREEN (MED CTR MEBANE ONLY): STREPTOCOCCUS, GROUP A SCREEN (DIRECT): NEGATIVE

## 2016-03-18 MED ORDER — SODIUM CHLORIDE 0.9 % IV SOLN: 1000.0000 mL | INTRAVENOUS | Status: DC

## 2016-03-18 MED ORDER — SODIUM CHLORIDE 0.9 % IV SOLN
1000.0000 mL | Freq: Once | INTRAVENOUS | Status: AC
  Administered 2016-03-18: 1000 mL via INTRAVENOUS

## 2016-03-18 MED ORDER — DEXAMETHASONE SODIUM PHOSPHATE 10 MG/ML IJ SOLN
10.0000 mg | Freq: Once | INTRAMUSCULAR | Status: AC
  Administered 2016-03-18: 10 mg via INTRAVENOUS
  Filled 2016-03-18: qty 1

## 2016-03-18 NOTE — ED Provider Notes (Signed)
Patient is stable and well hydrated. Will discharge.  Donnetta HutchingBrian Tamella Tuccillo, MD 03/18/16 [phone]

## 2016-03-18 NOTE — ED Notes (Signed)
Pt made aware to return if symptoms worsen or if any life threatening symptoms occur.   

## 2016-03-18 NOTE — ED Provider Notes (Signed)
CSN: [id number]     Arrival date & time 03/18/16  0535 History   First MD Initiated Contact with Patient 03/18/16 0540     Chief Complaint  Patient presents with  . Sore Throat     (Consider location/radiation/quality/duration/timing/severity/associated sxs/prior Treatment) Patient is a 20 y.o. female presenting with pharyngitis. The history is provided by the patient.  Sore Throat  She woke up at 5 AM and noted that she was unable to breathe. She also noted there throat was sore. When she looked at her throat in a near, she noted that her uvula was swollen. She rates her throat pain at 7/10. Pain is worse with swallowing. She denies fever or chills or sweats. She denies any sick contacts. There is been no treatment at home.  Past Medical History  Diagnosis Date  . Asthma   . Obesity   . Precocious puberty   . Prediabetes   . Diabetes mellitus without complication ([facility])   . Trichomonal vaginitis   . Depression     doing good  . Chlamydia    Past Surgical History  Procedure Laterality Date  . Dilation and curettage of uterus N/A 11/09/2014    Procedure: DILATATION AND CURETTAGE;  Surgeon: [name], MD;  Location: [facility];  Service: Gynecology;  Laterality: N/A;  . Dilation and curettage of uterus  2016   Family History  Problem Relation Age of Onset  . Diabetes Mother   . Hypertension Mother   . Obesity Mother   . Fibroids Mother   . Cancer Mother     thyroid  . Obesity Father   . Obesity Maternal Aunt   . Fibroids Maternal Aunt   . Obesity Maternal Uncle   . Obesity Paternal Aunt   . Diabetes Maternal Grandmother   . Obesity Maternal Grandmother   . Hypertension Maternal Grandmother   . Diabetes Maternal Grandfather   . Obesity Maternal Grandfather   . Stroke Maternal Grandfather   . Heart disease Maternal Grandfather   . Hypertension Maternal Grandfather   . Obesity Paternal Grandfather   . Heart disease Paternal Grandfather   . Bipolar disorder Brother   .  Bipolar disorder Brother    Social History  Substance Use Topics  . Smoking status: Never Smoker   . Smokeless tobacco: Never Used  . Alcohol Use: No   OB History    Gravida Para Term Preterm AB TAB SAB Ectopic Multiple Living   2 1  1 1  1    0     Review of Systems  All other systems reviewed and are negative.     Allergies  Review of patient's allergies indicates no known allergies.  Home Medications   Prior to Admission medications   Medication Sig Start Date End Date Taking? Authorizing Provider  citalopram (CELEXA) 10 MG tablet Take 1 tablet (10 mg total) by mouth daily. 01/25/16   Beau FannyJohn C Withrow, FNP  hydrOXYzine (ATARAX/VISTARIL) 25 MG tablet Take 1 tablet (25 mg total) by mouth every 6 (six) hours as needed for anxiety. 01/25/16   Beau FannyJohn C Withrow, FNP  traZODone (DESYREL) 50 MG tablet Take 1 tablet (50 mg total) by mouth at bedtime and may repeat dose one time if needed. 01/25/16   Beau FannyJohn C Withrow, FNP   BP 132/89 mmHg  Pulse 88  Temp(Src) 98.4 F (36.9 C) (Oral)  Resp 20  Ht 5\' 6"  (1.676 m)  Wt 257 lb (116.574 kg)  BMI 41.50 kg/m2  SpO2 98%  LMP 02/18/2016 Physical Exam  Nursing note and vitals reviewed.  20 year old female, resting comfortably and in no acute distress. Vital signs are normal. Oxygen saturation is 98%, which is normal. Head is normocephalic and atraumatic. PERRLA, EOMI. There is moderate tonsillar hypertrophy bilaterally without exudate. Uvula action does not appear swollen. Voice is slightly muffled but there is no pooling of secretions. Uvula retracts in the midline. No findings to suggest peritonsillar abscess.. Neck is nontender and supple without adenopathy or JVD. Back is nontender and there is no CVA tenderness. Lungs are clear without rales, wheezes, or rhonchi. Chest is nontender. Heart has regular rate and rhythm without murmur. Abdomen is soft, flat, nontender without masses or hepatosplenomegaly and peristalsis is  normoactive. Extremities have no cyanosis or edema, full range of motion is present. Skin is warm and dry without rash. Neurologic: Mental status is normal, cranial nerves are intact, there are no motor or sensory deficits.  ED Course  Procedures (including critical care time) Labs Review Results for orders placed or performed during the hospital encounter of 03/18/16  Rapid strep screen (not at [facility])  Result Value Ref Range   Streptococcus, Group A Screen (Direct) NEGATIVE NEGATIVE    I have personally reviewed and evaluated these lab results as part of my medical decision-making.    MDM   Final diagnoses:  Pharyngitis    Acute pharyngitis. Strep screen is been sent. She'll be given IV fluids and IV dexamethasone.  Strep screen is negative, culture pending. At this point, patient is still receiving IV fluids. Case is turned over to Dr. Adriana Simas to evaluate the patient for discharge.  Dione Booze, MD 03/18/16 [phone]

## 2016-03-18 NOTE — Discharge Instructions (Signed)

## 2016-03-18 NOTE — ED Notes (Addendum)
Pt states that she woke up with swelling to thel uvula, sob and vomiting that started this am,  Pt pulse ox 98% on RA, able to maintain oral secretions without any problem.

## 2016-03-21 LAB — CULTURE, GROUP A STREP (THRC)

## 2016-03-31 ENCOUNTER — Encounter (HOSPITAL_COMMUNITY): Payer: Self-pay

## 2016-03-31 ENCOUNTER — Inpatient Hospital Stay (HOSPITAL_COMMUNITY)
Admission: AD | Admit: 2016-03-31 | Discharge: 2016-04-01 | Disposition: A | Discharge status: Home / Self Care | Payer: Medicaid Other | Source: Ambulatory Visit | Attending: Family Medicine | Admitting: Family Medicine

## 2016-03-31 DIAGNOSIS — Z833 Family history of diabetes mellitus: Secondary | ICD-10-CM | POA: Diagnosis not present

## 2016-03-31 DIAGNOSIS — A499 Bacterial infection, unspecified: Secondary | ICD-10-CM | POA: Diagnosis not present

## 2016-03-31 DIAGNOSIS — N760 Acute vaginitis: Secondary | ICD-10-CM | POA: Diagnosis not present

## 2016-03-31 DIAGNOSIS — B9689 Other specified bacterial agents as the cause of diseases classified elsewhere: Secondary | ICD-10-CM | POA: Insufficient documentation

## 2016-03-31 DIAGNOSIS — Z6841 Body Mass Index (BMI) 40.0 and over, adult: Secondary | ICD-10-CM | POA: Insufficient documentation

## 2016-03-31 DIAGNOSIS — Z8249 Family history of ischemic heart disease and other diseases of the circulatory system: Secondary | ICD-10-CM | POA: Insufficient documentation

## 2016-03-31 DIAGNOSIS — Z202 Contact with and (suspected) exposure to infections with a predominantly sexual mode of transmission: Secondary | ICD-10-CM | POA: Insufficient documentation

## 2016-03-31 DIAGNOSIS — R300 Dysuria: Secondary | ICD-10-CM | POA: Diagnosis present

## 2016-03-31 DIAGNOSIS — Z3202 Encounter for pregnancy test, result negative: Secondary | ICD-10-CM | POA: Diagnosis not present

## 2016-03-31 DIAGNOSIS — F329 Major depressive disorder, single episode, unspecified: Secondary | ICD-10-CM | POA: Insufficient documentation

## 2016-03-31 DIAGNOSIS — E669 Obesity, unspecified: Secondary | ICD-10-CM | POA: Insufficient documentation

## 2016-03-31 DIAGNOSIS — F419 Anxiety disorder, unspecified: Secondary | ICD-10-CM | POA: Insufficient documentation

## 2016-03-31 DIAGNOSIS — Z79899 Other long term (current) drug therapy: Secondary | ICD-10-CM | POA: Insufficient documentation

## 2016-03-31 HISTORY — DX: Anxiety disorder, unspecified: F41.9

## 2016-03-31 LAB — URINALYSIS, ROUTINE W REFLEX MICROSCOPIC
BILIRUBIN URINE: NEGATIVE
Glucose, UA: NEGATIVE mg/dL
HGB URINE DIPSTICK: NEGATIVE
KETONES UR: NEGATIVE mg/dL
Leukocytes, UA: NEGATIVE
NITRITE: NEGATIVE
Protein, ur: 30 mg/dL — AB
pH: 6 (ref 5.0–8.0)

## 2016-03-31 LAB — POCT PREGNANCY, URINE: PREG TEST UR: NEGATIVE

## 2016-03-31 LAB — WET PREP, GENITAL
Sperm: NONE SEEN
Trich, Wet Prep: NONE SEEN
YEAST WET PREP: NONE SEEN

## 2016-03-31 LAB — URINE MICROSCOPIC-ADD ON

## 2016-03-31 NOTE — MAU Note (Signed)
Been taking all precautions and still having burning when i pee. Symptoms present for a week. Some abd pain lower abd on R when i pee. Urgency and frequency. No vag bleeding. Creamy vag d/c

## 2016-03-31 NOTE — MAU Provider Note (Signed)
History     CSN: [id number]  Arrival date and time: 03/31/16 2235   First Provider Initiated Contact with Patient 03/31/16 2341      Chief Complaint  Patient presents with  . Dysuria   HPI 20yo with dysuria x 10 days, gradually worsening.  + suprapubic pressure/pain, nonradiating.  Has been drinking water and cranberry juice without improvement.  No provoking factors.  Is concerned about STD exposure.   OB History    Gravida Para Term Preterm AB TAB SAB Ectopic Multiple Living   0      Past Medical History  Diagnosis Date  . Asthma   . Obesity   . Precocious puberty   . Prediabetes   . Diabetes mellitus without complication (HCC)   . Trichomonal vaginitis   . Depression     doing good  . Chlamydia   . Anxiety     Past Surgical History  Procedure Laterality Date  . Dilation and curettage of uterus N/A 11/09/2014    Procedure: DILATATION AND CURETTAGE;  Surgeon: Allie Bossier, MD;  Location: [facility];  Service: Gynecology;  Laterality: N/A;  . Dilation and curettage of uterus  2016    Family History  Problem Relation Age of Onset  . Diabetes Mother   . Hypertension Mother   . Obesity Mother   . Fibroids Mother   . Cancer Mother     thyroid  . Obesity Father   . Obesity Maternal Aunt   . Fibroids Maternal Aunt   . Obesity Maternal Uncle   . Obesity Paternal Aunt   . Diabetes Maternal Grandmother   . Obesity Maternal Grandmother   . Hypertension Maternal Grandmother   . Diabetes Maternal Grandfather   . Obesity Maternal Grandfather   . Stroke Maternal Grandfather   . Heart disease Maternal Grandfather   . Hypertension Maternal Grandfather   . Obesity Paternal Grandfather   . Heart disease Paternal Grandfather   . Bipolar disorder Brother   . Bipolar disorder Brother     Social History  Substance Use Topics  . Smoking status: Never Smoker   . Smokeless tobacco: Never Used  . Alcohol Use: No    Allergies: No Known  Allergies  Prescriptions prior to admission  Medication Sig Dispense Refill Last Dose  . diphenhydrAMINE (BENADRYL) 25 mg capsule Take 25 mg by mouth every 6 (six) hours as needed.   Past Week at Unknown time  . citalopram (CELEXA) 10 MG tablet Take 1 tablet (10 mg total) by mouth daily. 14 tablet 0   . hydrOXYzine (ATARAX/VISTARIL) 25 MG tablet Take 1 tablet (25 mg total) by mouth every 6 (six) hours as needed for anxiety. 21 tablet 0   . traZODone (DESYREL) 50 MG tablet Take 1 tablet (50 mg total) by mouth at bedtime and may repeat dose one time if needed. 14 tablet 0     Review of Systems  Constitutional: Negative for chills and malaise/fatigue.  Respiratory: Negative for sputum production and shortness of breath.   Gastrointestinal: Positive for nausea. Negative for vomiting, diarrhea, constipation and blood in stool.  Neurological: Negative for weakness.  All other systems reviewed and are negative.  Physical Exam   Blood pressure 126/73, pulse 101, temperature 98.8 F (37.1 C), resp. rate 18, height  (1.676 m), weight 256 lb 3.2 oz (116.212 kg), last menstrual period 03/18/2016.  Physical Exam  Constitutional: She is oriented to person, place, and  time. She appears well-developed and well-nourished.  HENT:  Head: Normocephalic and atraumatic.  Right Ear: External ear normal.  Left Ear: External ear normal.  Eyes: Conjunctivae are normal. Pupils are equal, round, and reactive to light. Right eye exhibits no discharge. Left eye exhibits no discharge.  Neck: Normal range of motion.  Cardiovascular: Normal rate, regular rhythm, normal heart sounds and intact distal pulses.   Respiratory: Effort normal and breath sounds normal. No respiratory distress. She has no wheezes. She has no rales. She exhibits no tenderness.  GI: Soft. Bowel sounds are normal. She exhibits no distension and no mass. There is tenderness (suprapubic). There is no rebound and no guarding.  Neurological:  She is alert and oriented to person, place, and time.  Skin: Skin is warm and dry. No rash noted. No erythema. No pallor.  Psychiatric: She has a normal mood and affect. Her behavior is normal. Judgment and thought content normal.   Results for Jaime Ramos, Jaime (MRN [id number]) as of 03/31/2016 23:46  Ref. Range 03/31/2016 22:55  Appearance Latest Ref Range: CLEAR  CLEAR  Bacteria, UA Latest Ref Range: NONE SEEN  MANY (A)  Bilirubin Urine Latest Ref Range: NEGATIVE  NEGATIVE  Color, Urine Latest Ref Range: YELLOW  YELLOW  Glucose Latest Ref Range: NEGATIVE mg/dL NEGATIVE  Hgb urine dipstick Latest Ref Range: NEGATIVE  NEGATIVE  Ketones, ur Latest Ref Range: NEGATIVE mg/dL NEGATIVE  Leukocytes, UA Latest Ref Range: NEGATIVE  NEGATIVE  Nitrite Latest Ref Range: NEGATIVE  NEGATIVE  pH Latest Ref Range: 5.0-8.0  6.0  Protein Latest Ref Range: NEGATIVE mg/dL 30 (A)  RBC / HPF Latest Ref Range: 0-5 RBC/hpf 6-30  Specific Gravity, Urine Latest Ref Range: 1.005-1.030  >1.030 (H)  Squamous Epithelial / LPF Latest Ref Range: NONE SEEN  6-30 (A)  WBC, UA Latest Ref Range: 0-5 WBC/hpf 6-30   Microscopic wet-mount exam shows clue cells.   MAU Course  Procedures  MDM UA, wet prep, Gc/CT, HIV, RPR collected.  Assessment and Plan  1.  BV 2.  STD exposure  Flagyl 500mg  BID x 7 days  HIV, RPR, GC/CT drawn.  Discharge to home   Jaime Ramos, Jaime Ramos 03/31/2016, 11:43 PM

## 2016-04-01 DIAGNOSIS — N760 Acute vaginitis: Secondary | ICD-10-CM

## 2016-04-01 DIAGNOSIS — A499 Bacterial infection, unspecified: Secondary | ICD-10-CM | POA: Diagnosis not present

## 2016-04-01 DIAGNOSIS — Z3202 Encounter for pregnancy test, result negative: Secondary | ICD-10-CM | POA: Diagnosis not present

## 2016-04-01 LAB — RPR: RPR: NONREACTIVE

## 2016-04-01 LAB — HIV ANTIBODY (ROUTINE TESTING W REFLEX): HIV Screen 4th Generation wRfx: NONREACTIVE

## 2016-04-01 MED ORDER — METRONIDAZOLE 500 MG PO TABS: 500.0000 mg | ORAL_TABLET | Freq: Two times a day (BID) | ORAL | Status: DC

## 2016-04-01 NOTE — Discharge Instructions (Signed)

## 2016-04-03 ENCOUNTER — Telehealth (HOSPITAL_COMMUNITY): Payer: Self-pay | Admitting: *Deleted

## 2016-04-03 DIAGNOSIS — A749 Chlamydial infection, unspecified: Secondary | ICD-10-CM

## 2016-04-03 LAB — GC/CHLAMYDIA PROBE AMP (~~LOC~~) NOT AT ARMC
Chlamydia: POSITIVE — AB
Neisseria Gonorrhea: NEGATIVE

## 2016-04-03 MED ORDER — AZITHROMYCIN 500 MG PO TABS: ORAL_TABLET | ORAL | Status: DC

## 2016-04-03 NOTE — Telephone Encounter (Signed)
Telephone call to patient regarding positive chlamydia culture, patient notified.  Rx routed to pharmacy per protocol.  Instructed patient to notify her partner for treatment and to abstain from sex for seven days post treatment.  Report faxed to health department.  

## 2016-05-11 ENCOUNTER — Encounter (HOSPITAL_COMMUNITY): Payer: Self-pay | Admitting: Emergency Medicine

## 2016-05-11 ENCOUNTER — Emergency Department (HOSPITAL_COMMUNITY)
Admission: EM | Admit: 2016-05-11 | Discharge: 2016-05-11 | Disposition: A | Discharge status: Home / Self Care | Payer: Medicaid Other | Attending: Emergency Medicine | Admitting: Emergency Medicine

## 2016-05-11 DIAGNOSIS — R51 Headache: Secondary | ICD-10-CM | POA: Insufficient documentation

## 2016-05-11 DIAGNOSIS — F329 Major depressive disorder, single episode, unspecified: Secondary | ICD-10-CM | POA: Insufficient documentation

## 2016-05-11 DIAGNOSIS — E119 Type 2 diabetes mellitus without complications: Secondary | ICD-10-CM | POA: Diagnosis not present

## 2016-05-11 DIAGNOSIS — J45909 Unspecified asthma, uncomplicated: Secondary | ICD-10-CM | POA: Diagnosis not present

## 2016-05-11 DIAGNOSIS — R519 Headache, unspecified: Secondary | ICD-10-CM

## 2016-05-11 LAB — I-STAT BETA HCG BLOOD, ED (MC, WL, AP ONLY)

## 2016-05-11 LAB — COMPREHENSIVE METABOLIC PANEL
ALT: 13 U/L — AB (ref 14–54)
ANION GAP: 5 (ref 5–15)
AST: 17 U/L (ref 15–41)
Albumin: 3.6 g/dL (ref 3.5–5.0)
Alkaline Phosphatase: 79 U/L (ref 38–126)
BUN: 11 mg/dL (ref 6–20)
CHLORIDE: 108 mmol/L (ref 101–111)
CO2: 24 mmol/L (ref 22–32)
CREATININE: 0.49 mg/dL (ref 0.44–1.00)
Calcium: 8.9 mg/dL (ref 8.9–10.3)
Glucose, Bld: 82 mg/dL (ref 65–99)
Potassium: 3.7 mmol/L (ref 3.5–5.1)
Sodium: 137 mmol/L (ref 135–145)
Total Bilirubin: 0.2 mg/dL — ABNORMAL LOW (ref 0.3–1.2)
Total Protein: 7.3 g/dL (ref 6.5–8.1)

## 2016-05-11 LAB — LIPASE, BLOOD: LIPASE: 29 U/L (ref 11–51)

## 2016-05-11 LAB — CBC
HCT: 35.5 % — ABNORMAL LOW (ref 36.0–46.0)
Hemoglobin: 10.8 g/dL — ABNORMAL LOW (ref 12.0–15.0)
MCH: 23.1 pg — ABNORMAL LOW (ref 26.0–34.0)
MCHC: 30.4 g/dL (ref 30.0–36.0)
MCV: 76 fL — AB (ref 78.0–100.0)
PLATELETS: 407 10*3/uL — AB (ref 150–400)
RBC: 4.67 MIL/uL (ref 3.87–5.11)
RDW: 16 % — ABNORMAL HIGH (ref 11.5–15.5)
WBC: 10.1 10*3/uL (ref 4.0–10.5)

## 2016-05-11 LAB — URINALYSIS, ROUTINE W REFLEX MICROSCOPIC
Bilirubin Urine: NEGATIVE
GLUCOSE, UA: NEGATIVE mg/dL
Hgb urine dipstick: NEGATIVE
Ketones, ur: NEGATIVE mg/dL
LEUKOCYTES UA: NEGATIVE
Nitrite: NEGATIVE
PROTEIN: NEGATIVE mg/dL
Specific Gravity, Urine: 1.028 (ref 1.005–1.030)
pH: 7.5 (ref 5.0–8.0)

## 2016-05-11 MED ORDER — DIPHENHYDRAMINE HCL 50 MG/ML IJ SOLN
25.0000 mg | Freq: Once | INTRAMUSCULAR | Status: AC
  Administered 2016-05-11: 25 mg via INTRAVENOUS
  Filled 2016-05-11: qty 1

## 2016-05-11 MED ORDER — KETOROLAC TROMETHAMINE 30 MG/ML IJ SOLN
30.0000 mg | Freq: Once | INTRAMUSCULAR | Status: AC
  Administered 2016-05-11: 30 mg via INTRAVENOUS
  Filled 2016-05-11: qty 1

## 2016-05-11 MED ORDER — IBUPROFEN 800 MG PO TABS: 800.0000 mg | ORAL_TABLET | Freq: Three times a day (TID) | ORAL | Status: DC | PRN

## 2016-05-11 MED ORDER — PROCHLORPERAZINE EDISYLATE 5 MG/ML IJ SOLN
10.0000 mg | Freq: Once | INTRAMUSCULAR | Status: AC
  Administered 2016-05-11: 10 mg via INTRAVENOUS
  Filled 2016-05-11: qty 2

## 2016-05-11 MED ORDER — SODIUM CHLORIDE 0.9 % IV BOLUS (SEPSIS)
1000.0000 mL | Freq: Once | INTRAVENOUS | Status: AC
  Administered 2016-05-11: 1000 mL via INTRAVENOUS

## 2016-05-11 NOTE — ED Provider Notes (Signed)
CSN: [id number]     Arrival date & time 05/11/16  1634 History   First MD Initiated Contact with Patient 05/11/16 1810     Chief Complaint  Patient presents with  . Headache     (Consider location/radiation/quality/duration/timing/severity/associated sxs/prior Treatment) HPI Patient presents to the emergency department with headache that started 2 days ago.  The patient states that she has been having light sensitivity, nausea and bilateral frontal headache for the last 2 days.  The patient states that she did not attempt any treatment at home.  She states she does have a history of migraine headache.  She states that she has had similar headaches in the past.The patient denies chest pain, shortness of breath, blurred vision, neck pain, fever, cough, weakness, numbness, dizziness, anorexia, edema, abdominal pain, nausea, vomiting, diarrhea, rash, back pain, dysuria, hematemesis, bloody stool, near syncope, or syncope. Past Medical History  Diagnosis Date  . Asthma   . Obesity   . Precocious puberty   . Prediabetes   . Diabetes mellitus without complication (HCC)   . Trichomonal vaginitis   . Depression     doing good  . Chlamydia   . Anxiety    Past Surgical History  Procedure Laterality Date  . Dilation and curettage of uterus N/A 11/09/2014    Procedure: DILATATION AND CURETTAGE;  Surgeon: [name], MD;  Location: [facility];  Service: Gynecology;  Laterality: N/A;  . Dilation and curettage of uterus  2016   Family History  Problem Relation Age of Onset  . Diabetes Mother   . Hypertension Mother   . Obesity Mother   . Fibroids Mother   . Cancer Mother     thyroid  . Obesity Father   . Obesity Maternal Aunt   . Fibroids Maternal Aunt   . Obesity Maternal Uncle   . Obesity Paternal Aunt   . Diabetes Maternal Grandmother   . Obesity Maternal Grandmother   . Hypertension Maternal Grandmother   . Diabetes Maternal Grandfather   . Obesity Maternal Grandfather   . Stroke  Maternal Grandfather   . Heart disease Maternal Grandfather   . Hypertension Maternal Grandfather   . Obesity Paternal Grandfather   . Heart disease Paternal Grandfather   . Bipolar disorder Brother   . Bipolar disorder Brother    Social History  Substance Use Topics  . Smoking status: Never Smoker   . Smokeless tobacco: Never Used  . Alcohol Use: No   OB History    Gravida Para Term Preterm AB TAB SAB Ectopic Multiple Living   2 1  1 1  1    0     Review of Systems  All other systems negative except as documented in the HPI. All pertinent positives and negatives as reviewed in the HPI.  Allergies  Review of patient's allergies indicates no known allergies.  Home Medications   Prior to Admission medications   Medication Sig Start Date End Date Taking? Authorizing Provider  azithromycin (ZITHROMAX) 500 MG tablet Take two tablets by mouth once. Patient not taking: Reported on 05/11/2016 04/03/16   Catalina AntiguaPeggy Constant, MD  metroNIDAZOLE (FLAGYL) 500 MG tablet Take 1 tablet (500 mg total) by mouth 2 (two) times daily. Patient not taking: Reported on 05/11/2016 04/01/16   Rhona RaiderJacob J Stinson, DO   BP 140/76 mmHg  Pulse 104  Temp(Src) 99.3 F (37.4 C) (Oral)  Resp 17  SpO2 99%  LMP 03/15/2016 Physical Exam  Constitutional: She is oriented to person, place, and  time. She appears well-developed and well-nourished. No distress.  HENT:  Head: Normocephalic and atraumatic.  Mouth/Throat: Oropharynx is clear and moist.  Eyes: Pupils are equal, round, and reactive to light.  Neck: Normal range of motion. Neck supple.  Cardiovascular: Normal rate, regular rhythm and normal heart sounds.  Exam reveals no gallop and no friction rub.   No murmur heard. Pulmonary/Chest: Effort normal and breath sounds normal. No respiratory distress. She has no wheezes.  Musculoskeletal: She exhibits no edema.  Neurological: She is alert and oriented to person, place, and time. She exhibits normal muscle tone.  Coordination normal.  Skin: Skin is warm and dry. No rash noted. No erythema.  Psychiatric: She has a normal mood and affect. Her behavior is normal.  Nursing note and vitals reviewed.   ED Course  Procedures (including critical care time) Labs Review Labs Reviewed  COMPREHENSIVE METABOLIC PANEL - Abnormal; Notable for the following:    ALT 13 (*)    Total Bilirubin 0.2 (*)    All other components within normal limits  CBC - Abnormal; Notable for the following:    Hemoglobin 10.8 (*)    HCT 35.5 (*)    MCV 76.0 (*)    MCH 23.1 (*)    RDW 16.0 (*)    Platelets 407 (*)    All other components within normal limits  URINALYSIS, ROUTINE W REFLEX MICROSCOPIC (NOT AT [facility]) - Abnormal; Notable for the following:    APPearance CLOUDY (*)    All other components within normal limits  LIPASE, BLOOD  I-STAT BETA HCG BLOOD, ED ([facility], [facility], [facility] ONLY)    Imaging Review No results found. I have personally reviewed and evaluated these images and lab results as part of my medical decision-making.   Patient is treated for migraine type headache.  Told to return here as needed.  Advised to increase her fluid intake and rest as much as possible.  Patient does not have any signs of infectious process on her testing.  The patient shows no signs of meningitis or other significant neurological impairment at this time    Charlestine NightChristopher Reine Bristow, PA-C 05/11/16 1943  Mancel BaleElliott Wentz, MD 05/12/16 1122

## 2016-05-11 NOTE — Discharge Instructions (Signed)
Return here as needed.  Follow-up with your primary care doctor °

## 2016-05-11 NOTE — ED Notes (Signed)
Pt complaint of headache with nausea, vomiting, weakness, and chills onset yesterday.

## 2016-07-06 ENCOUNTER — Inpatient Hospital Stay (HOSPITAL_COMMUNITY)
Admission: AD | Admit: 2016-07-06 | Discharge: 2016-07-06 | Disposition: A | Discharge status: Home / Self Care | Payer: Medicaid Other | Source: Ambulatory Visit | Attending: Obstetrics and Gynecology | Admitting: Obstetrics and Gynecology

## 2016-07-06 DIAGNOSIS — Z113 Encounter for screening for infections with a predominantly sexual mode of transmission: Secondary | ICD-10-CM

## 2016-07-06 DIAGNOSIS — N898 Other specified noninflammatory disorders of vagina: Secondary | ICD-10-CM | POA: Diagnosis not present

## 2016-07-06 DIAGNOSIS — Z79899 Other long term (current) drug therapy: Secondary | ICD-10-CM | POA: Insufficient documentation

## 2016-07-06 DIAGNOSIS — Z9889 Other specified postprocedural states: Secondary | ICD-10-CM | POA: Insufficient documentation

## 2016-07-06 DIAGNOSIS — Z202 Contact with and (suspected) exposure to infections with a predominantly sexual mode of transmission: Secondary | ICD-10-CM | POA: Diagnosis present

## 2016-07-06 LAB — GC/CHLAMYDIA PROBE AMP (~~LOC~~) NOT AT ARMC
Chlamydia: NEGATIVE
Neisseria Gonorrhea: NEGATIVE

## 2016-07-06 LAB — URINALYSIS, ROUTINE W REFLEX MICROSCOPIC
Bilirubin Urine: NEGATIVE
GLUCOSE, UA: NEGATIVE mg/dL
Hgb urine dipstick: NEGATIVE
Ketones, ur: NEGATIVE mg/dL
LEUKOCYTES UA: NEGATIVE
Nitrite: NEGATIVE
PROTEIN: NEGATIVE mg/dL
Specific Gravity, Urine: >1.03 — ABNORMAL HIGH (ref 1.005–1.030)
pH: 6 (ref 5.0–8.0)

## 2016-07-06 LAB — WET PREP, GENITAL
SPERM: NONE SEEN
TRICH WET PREP: NONE SEEN
YEAST WET PREP: NONE SEEN

## 2016-07-06 LAB — HIV ANTIBODY (ROUTINE TESTING W REFLEX): HIV Screen 4th Generation wRfx: NONREACTIVE

## 2016-07-06 LAB — POCT PREGNANCY, URINE: Preg Test, Ur: NEGATIVE

## 2016-07-06 LAB — RPR: RPR: NONREACTIVE

## 2016-07-06 MED ORDER — AZITHROMYCIN 250 MG PO TABS
1000.0000 mg | ORAL_TABLET | Freq: Once | ORAL | Status: AC
  Administered 2016-07-06: 1000 mg via ORAL
  Filled 2016-07-06: qty 4

## 2016-07-06 MED ORDER — CEFTRIAXONE SODIUM 250 MG IJ SOLR
250.0000 mg | Freq: Once | INTRAMUSCULAR | Status: AC
  Administered 2016-07-06: 250 mg via INTRAMUSCULAR
  Filled 2016-07-06: qty 250

## 2016-07-06 NOTE — MAU Note (Signed)
Pt states that boyfriend told her that he has chlamydia. Notes a small amount of thin, white vaginal discharge. Has some mild discomfort with urination. LMP: 06/11/2016

## 2016-07-06 NOTE — Discharge Instructions (Signed)
Chlamydia, Female Chlamydia is an infection. It is spread through sexual contact. Chlamydia can be in different areas of the body. These areas include the cervix, urethra, throat, or rectum. You may not know you have chlamydia because many people never develop the symptoms. Chlamydia is not difficult to treat once you know you have it. However, if it is left untreated, chlamydia can lead to more serious health problems.  CAUSES  Chlamydia is caused by bacteria. It is a sexually transmitted disease. It is passed from an infected partner during intimate contact. This contact could be with the genitals, mouth, or rectal area. Chlamydia can also be passed from mothers to babies during birth. SIGNS AND SYMPTOMS  There may not be any symptoms. This is often the case early in the infection. If symptoms develop, they may include:  Mild pain and discomfort when urinating.  Redness, soreness, and swelling (inflammation) of the rectum.  Vaginal discharge.  Painful intercourse.  Abdominal pain.  Bleeding between menstrual periods. DIAGNOSIS  To diagnose this infection, your health care provider will do a pelvic exam. Cultures will be taken of the vagina, cervix, urine, and possibly the rectum to verify the diagnosis.  TREATMENT You will be given antibiotic medicines. If you are pregnant, certain types of antibiotics will need to be avoided. Any sexual partners should also be treated, even if they do not show symptoms. Your health care provider may test you for infection again 3 months after treatment. HOME CARE INSTRUCTIONS   Take your antibiotic medicine as directed by your health care provider. Finish the antibiotic even if you start to feel better.  Take medicines only as directed by your health care provider.  Inform any sexual partners about the infection. They should also be treated.  Do not have sexual contact until your health care provider tells you it is okay.  Get plenty of  rest.  Eat a well-balanced diet.  Drink enough fluids to keep your urine clear or pale yellow.  Keep all follow-up visits as directed by your health care provider. SEEK MEDICAL CARE IF:  You have painful urination.  You have abdominal pain.  You have vaginal discharge.  You have painful sexual intercourse.  You have bleeding between periods and after sex.  You have a fever. SEEK IMMEDIATE MEDICAL CARE IF:   You experience nausea or vomiting.  You experience excessive sweating (diaphoresis).  You have difficulty swallowing.   This information is not intended to replace advice given to you by your health care provider. Make sure you discuss any questions you have with your health care provider.   Document Released: 08/01/2005 Document Revised: 07/13/2015 Document Reviewed: 06/29/2013 Elsevier Interactive Patient Education 2016 [employer] Inc[employer]  

## 2016-07-06 NOTE — MAU Provider Note (Signed)
History     CSN: [id number]  Arrival date and time: 07/06/16 0150   First Provider Initiated Contact with Patient 07/06/16 0232      Chief Complaint  Patient presents with  . Exposure to STD   Exposure to STD   The patient's primary symptoms include a discharge, dysuria and pelvic pain. The patient's pertinent negatives include no genital itching. This is a new problem. The current episode started today. The problem has been unchanged. The vaginal discharge was white. Pertinent negatives include no abdominal pain, fever or urinary frequency. She has tried nothing for the symptoms. Risk factors: Patient reports that her partner was + for chlamydia.     Past Medical History:  Diagnosis Date  . Anxiety   . Asthma   . Chlamydia   . Depression    doing good  . Diabetes mellitus without complication (HCC)   . Obesity   . Precocious puberty   . Prediabetes   . Trichomonal vaginitis     Past Surgical History:  Procedure Laterality Date  . DILATION AND CURETTAGE OF UTERUS N/A 11/09/2014   Procedure: DILATATION AND CURETTAGE;  Surgeon: Allie Bossier, MD;  Location: [facility];  Service: Gynecology;  Laterality: N/A;  . DILATION AND CURETTAGE OF UTERUS  2016    Family History  Problem Relation Age of Onset  . Diabetes Mother   . Hypertension Mother   . Obesity Mother   . Fibroids Mother   . Cancer Mother     thyroid  . Obesity Father   . Obesity Maternal Aunt   . Fibroids Maternal Aunt   . Obesity Maternal Uncle   . Obesity Paternal Aunt   . Diabetes Maternal Grandmother   . Obesity Maternal Grandmother   . Hypertension Maternal Grandmother   . Diabetes Maternal Grandfather   . Obesity Maternal Grandfather   . Stroke Maternal Grandfather   . Heart disease Maternal Grandfather   . Hypertension Maternal Grandfather   . Obesity Paternal Grandfather   . Heart disease Paternal Grandfather   . Bipolar disorder Brother   . Bipolar disorder Brother     Social History  Substance  Use Topics  . Smoking status: Never Smoker  . Smokeless tobacco: Never Used  . Alcohol use No    Allergies: No Known Allergies  Prescriptions Prior to Admission  Medication Sig Dispense Refill Last Dose  . azithromycin (ZITHROMAX) 500 MG tablet Take two tablets by mouth once. (Patient not taking: Reported on 05/11/2016) 2 tablet 0 Completed Course at Unknown time  . ibuprofen (ADVIL,MOTRIN) 800 MG tablet Take 1 tablet (800 mg total) by mouth every 8 (eight) hours as needed. 21 tablet 0   . metroNIDAZOLE (FLAGYL) 500 MG tablet Take 1 tablet (500 mg total) by mouth 2 (two) times daily. (Patient not taking: Reported on 05/11/2016) 14 tablet 0 Completed Course at Unknown time    Review of Systems  Constitutional: Negative for chills and fever.  Gastrointestinal: Negative for abdominal pain, diarrhea, nausea and vomiting.  Genitourinary: Positive for dysuria and pelvic pain. Negative for frequency and urgency.   Physical Exam   Blood pressure 140/85, pulse 82, temperature 98.7 F (37.1 C), temperature source Oral, resp. rate 20, height 5\' 6"  (1.676 m), weight 259 lb (117.5 kg), last menstrual period 06/11/2016, SpO2 98 %.  Physical Exam  Nursing note and vitals reviewed. Constitutional: She is oriented to person, place, and time. She appears well-developed and well-nourished. No distress.  HENT:  Head: Normocephalic.  Cardiovascular: Normal rate.   Respiratory: Effort normal.  Musculoskeletal: Normal range of motion.  Neurological: She is alert and oriented to person, place, and time.  Psychiatric: She has a normal mood and affect.     Results for orders placed or performed during the hospital encounter of 07/06/16 (from the past 24 hour(s))  Wet prep, genital     Status: Abnormal   Collection Time: 07/06/16  1:59 AM  Result Value Ref Range   Yeast Wet Prep HPF POC NONE SEEN NONE SEEN   Trich, Wet Prep NONE SEEN NONE SEEN   Clue Cells Wet Prep HPF POC PRESENT (A) NONE SEEN   WBC,  Wet Prep HPF POC FEW (A) NONE SEEN   Sperm NONE SEEN   Urinalysis, Routine w reflex microscopic (not at [facility])     Status: Abnormal   Collection Time: 07/06/16  1:59 AM  Result Value Ref Range   Color, Urine YELLOW YELLOW   APPearance HAZY (A) CLEAR   Specific Gravity, Urine >1.030 (H) 1.005 - 1.030   pH 6.0 5.0 - 8.0   Glucose, UA NEGATIVE NEGATIVE mg/dL   Hgb urine dipstick NEGATIVE NEGATIVE   Bilirubin Urine NEGATIVE NEGATIVE   Ketones, ur NEGATIVE NEGATIVE mg/dL   Protein, ur NEGATIVE NEGATIVE mg/dL   Nitrite NEGATIVE NEGATIVE   Leukocytes, UA NEGATIVE NEGATIVE  Pregnancy, urine POC     Status: None   Collection Time: 07/06/16  2:20 AM  Result Value Ref Range   Preg Test, Ur NEGATIVE NEGATIVE    MAU Course  Procedures  MDM Treated today with 250mg  rocephin and 1,000 mg of azithromycin.   Assessment and Plan   1. Exposure to sexually transmitted disease (STD)   2. Vaginal discharge    DC home Comfort measures reviewed  RX: none  Return to MAU as needed   [facility] Information    [facility] .   Contact information: [address] [place] [address] [phone]            Tawnya CrookHogan, Pamila Mendibles Donovan 07/06/2016, 2:33 AM

## 2016-07-23 ENCOUNTER — Encounter (HOSPITAL_COMMUNITY): Payer: Self-pay

## 2016-07-23 ENCOUNTER — Inpatient Hospital Stay (HOSPITAL_COMMUNITY)
Admission: AD | Admit: 2016-07-23 | Discharge: 2016-07-23 | Disposition: A | Discharge status: Home / Self Care | Payer: Medicaid Other | Source: Ambulatory Visit | Attending: Family Medicine | Admitting: Family Medicine

## 2016-07-23 DIAGNOSIS — A5901 Trichomonal vulvovaginitis: Secondary | ICD-10-CM | POA: Insufficient documentation

## 2016-07-23 DIAGNOSIS — Z3202 Encounter for pregnancy test, result negative: Secondary | ICD-10-CM | POA: Diagnosis not present

## 2016-07-23 DIAGNOSIS — A599 Trichomoniasis, unspecified: Secondary | ICD-10-CM

## 2016-07-23 DIAGNOSIS — B373 Candidiasis of vulva and vagina: Secondary | ICD-10-CM | POA: Insufficient documentation

## 2016-07-23 DIAGNOSIS — B3731 Acute candidiasis of vulva and vagina: Secondary | ICD-10-CM

## 2016-07-23 DIAGNOSIS — N898 Other specified noninflammatory disorders of vagina: Secondary | ICD-10-CM | POA: Diagnosis present

## 2016-07-23 LAB — WET PREP, GENITAL
CLUE CELLS WET PREP: NONE SEEN
SPERM: NONE SEEN

## 2016-07-23 LAB — URINALYSIS, ROUTINE W REFLEX MICROSCOPIC
BILIRUBIN URINE: NEGATIVE
GLUCOSE, UA: NEGATIVE mg/dL
HGB URINE DIPSTICK: NEGATIVE
Ketones, ur: NEGATIVE mg/dL
Nitrite: NEGATIVE
Protein, ur: NEGATIVE mg/dL
SPECIFIC GRAVITY, URINE: 1.015 (ref 1.005–1.030)
pH: 8 (ref 5.0–8.0)

## 2016-07-23 LAB — URINE MICROSCOPIC-ADD ON
Bacteria, UA: NONE SEEN
RBC / HPF: NONE SEEN RBC/hpf (ref 0–5)

## 2016-07-23 LAB — POCT PREGNANCY, URINE: PREG TEST UR: NEGATIVE

## 2016-07-23 MED ORDER — FLUCONAZOLE 150 MG PO TABS: 150.0000 mg | ORAL_TABLET | Freq: Every day | ORAL | 1 refills | Status: DC

## 2016-07-23 MED ORDER — METRONIDAZOLE 500 MG PO TABS
2000.0000 mg | ORAL_TABLET | Freq: Once | ORAL | Status: AC
Start: 2016-07-23 — End: 2016-07-23
  Administered 2016-07-23: 2000 mg via ORAL
  Filled 2016-07-23: qty 4

## 2016-07-23 MED ORDER — ONDANSETRON 8 MG PO TBDP
8.0000 mg | ORAL_TABLET | Freq: Once | ORAL | Status: AC
  Administered 2016-07-23: 8 mg via ORAL
  Filled 2016-07-23: qty 1

## 2016-07-23 NOTE — MAU Note (Signed)
Pt seen recently for exposure to chlamydia, was treated,  began having pelvic pain a week later.  Has dysuria, also itching.  Has white discharge with odor.

## 2016-07-23 NOTE — MAU Provider Note (Signed)
History     CSN: [id number]  Arrival date and time: 07/23/16 1148   First Provider Initiated Contact with Patient 07/23/16 1307      Chief Complaint  Patient presents with  . Vaginal Discharge  . Dysuria   Jaime NewcomerMichaela Malen Ramos is a 20 y.o. Female who presents for vaginal discharge & dysuria. Was seen on 9/1 for same symptoms & STD exposure. Swabs collected & pt treated with recephin & zitrhomax. Pt states symptoms returned 1 week later. Denis intercourse since receiving treatment in MAU.    Female GU Problem  The patient's primary symptoms include genital itching, pelvic pain and vaginal discharge. The patient's pertinent negatives include no missed menses or vaginal bleeding. Episode onset: x 1 week. The problem occurs constantly. The problem has been gradually worsening. She is not pregnant. Associated symptoms include dysuria, frequency and nausea. Pertinent negatives include no abdominal pain, chills, constipation, diarrhea, fever, flank pain, hematuria or vomiting. The vaginal discharge was thick and white. There has been no bleeding. Nothing aggravates the symptoms. She has tried nothing for the symptoms. Sexual activity: not sexually active since STD tx 07/06/16. Her past medical history is significant for an STD.  Dysuria   The current episode started in the past 7 days. The problem occurs intermittently. The problem has been unchanged. The quality of the pain is described as burning. The pain is at a severity of 8/10. There has been no fever. She is sexually active. There is no history of pyelonephritis. Associated symptoms include frequency and nausea. Pertinent negatives include no chills, flank pain, hematuria or vomiting. She has tried nothing for the symptoms. There is no history of kidney stones or recurrent UTIs.    OB History    Gravida Para Term Preterm AB Living   2 1   1 1  0   SAB TAB Ectopic Multiple Live Births   1       1      Past Medical History:  Diagnosis Date  .  Anxiety   . Asthma   . Chlamydia   . Depression    doing good  . Diabetes mellitus without complication (HCC)   . Obesity   . Precocious puberty   . Prediabetes   . Trichomonal vaginitis     Past Surgical History:  Procedure Laterality Date  . DILATION AND CURETTAGE OF UTERUS N/A 11/09/2014   Procedure: DILATATION AND CURETTAGE;  Surgeon: [name], MD;  Location: [facility];  Service: Gynecology;  Laterality: N/A;  . DILATION AND CURETTAGE OF UTERUS  2016    Family History  Problem Relation Age of Onset  . Diabetes Mother   . Hypertension Mother   . Obesity Mother   . Fibroids Mother   . Cancer Mother     thyroid  . Obesity Father   . Obesity Maternal Aunt   . Fibroids Maternal Aunt   . Obesity Maternal Uncle   . Obesity Paternal Aunt   . Diabetes Maternal Grandmother   . Obesity Maternal Grandmother   . Hypertension Maternal Grandmother   . Diabetes Maternal Grandfather   . Obesity Maternal Grandfather   . Stroke Maternal Grandfather   . Heart disease Maternal Grandfather   . Hypertension Maternal Grandfather   . Obesity Paternal Grandfather   . Heart disease Paternal Grandfather   . Bipolar disorder Brother   . Bipolar disorder Brother     Social History  Substance Use Topics  . Smoking status: Never Smoker  . Smokeless  tobacco: Never Used  . Alcohol use No    Allergies: No Known Allergies  Prescriptions Prior to Admission  Medication Sig Dispense Refill Last Dose  . ibuprofen (ADVIL,MOTRIN) 800 MG tablet Take 1 tablet (800 mg total) by mouth every 8 (eight) hours as needed. 21 tablet 0   . metroNIDAZOLE (FLAGYL) 500 MG tablet Take 1 tablet (500 mg total) by mouth 2 (two) times daily. (Patient not taking: Reported on 05/11/2016) 14 tablet 0 Completed Course at Unknown time    Review of Systems  Constitutional: Negative for chills and fever.  Gastrointestinal: Positive for nausea. Negative for abdominal pain, constipation, diarrhea and vomiting.   Genitourinary: Positive for dysuria, frequency, pelvic pain and vaginal discharge. Negative for flank pain, hematuria and missed menses.       + vaginal discharge & irritation No vaginal bleeding   Physical Exam   Blood pressure 121/65, pulse 100, temperature 98.2 F (36.8 C), temperature source Oral, resp. rate 18, last menstrual period 07/06/2016.  Physical Exam  Nursing note and vitals reviewed. Constitutional: She is oriented to person, place, and time. She appears well-developed and well-nourished. No distress.  HENT:  Head: Normocephalic and atraumatic.  Eyes: Conjunctivae are normal. Right eye exhibits no discharge. Left eye exhibits no discharge. No scleral icterus.  Neck: Normal range of motion.  Respiratory: Effort normal. No respiratory distress.  GI: Soft. She exhibits no distension. There is no tenderness. There is no rebound, no guarding and no CVA tenderness.  Genitourinary: Uterus normal. Cervix exhibits no motion tenderness and no friability. Right adnexum displays no mass and no tenderness. Left adnexum displays no mass and no tenderness. There is erythema in the vagina. No bleeding in the vagina. Vaginal discharge (small amount of thick white discharge adherant to vaginal walls) found.  Neurological: She is alert and oriented to person, place, and time.  Skin: Skin is warm and dry. She is not diaphoretic.  Psychiatric: She has a normal mood and affect. Her behavior is normal. Judgment and thought content normal.    MAU Course  Procedures Results for orders placed or performed during the hospital encounter of 07/23/16 (from the past 24 hour(s))  Pregnancy, urine POC     Status: None   Collection Time: 07/23/16 12:19 PM  Result Value Ref Range   Preg Test, Ur NEGATIVE NEGATIVE  Urinalysis, Routine w reflex microscopic (not at [facility])     Status: Abnormal   Collection Time: 07/23/16  1:00 PM  Result Value Ref Range   Color, Urine YELLOW YELLOW   APPearance CLOUDY  (A) CLEAR   Specific Gravity, Urine 1.015 1.005 - 1.030   pH 8.0 5.0 - 8.0   Glucose, UA NEGATIVE NEGATIVE mg/dL   Hgb urine dipstick NEGATIVE NEGATIVE   Bilirubin Urine NEGATIVE NEGATIVE   Ketones, ur NEGATIVE NEGATIVE mg/dL   Protein, ur NEGATIVE NEGATIVE mg/dL   Nitrite NEGATIVE NEGATIVE   Leukocytes, UA MODERATE (A) NEGATIVE  Urine microscopic-add on     Status: Abnormal   Collection Time: 07/23/16  1:00 PM  Result Value Ref Range   Squamous Epithelial / LPF 6-30 (A) NONE SEEN   WBC, UA 6-30 0 - 5 WBC/hpf   RBC / HPF NONE SEEN 0 - 5 RBC/hpf   Bacteria, UA NONE SEEN NONE SEEN   Trichomonas, UA PRESENT    Urine-Other AMORPHOUS URATES/PHOSPHATES   Wet prep, genital     Status: Abnormal   Collection Time: 07/23/16  1:04 PM  Result Value Ref Range  Yeast Wet Prep HPF POC PRESENT (A) NONE SEEN   Trich, Wet Prep PRESENT (A) NONE SEEN   Clue Cells Wet Prep HPF POC NONE SEEN NONE SEEN   WBC, Wet Prep HPF POC MODERATE (A) NONE SEEN   Sperm NONE SEEN     MDM UPT negative U/a, GC/CT, wet prep + trich --- flagyl 2 gm PO in MAU Assessment and Plan  A: 1. Vaginal yeast infection   2. Trichomoniasis     P: Discharge home Rx diflucan F/u with [facility] for routine care No intercourse x 1 weeks after both treated Pt states not in contact with partner anymore for expedited partner tx GC/CT pending   Judeth Horn 07/23/2016, 1:06 PM

## 2016-07-23 NOTE — Discharge Instructions (Signed)
Trichomoniasis Trichomoniasis is an infection caused by an organism called Trichomonas. The infection can affect both women and men. In women, the outer female genitalia and the vagina are affected. In men, the penis is mainly affected, but the prostate and other reproductive organs can also be involved. Trichomoniasis is a sexually transmitted infection (STI) and is most often passed to another person through sexual contact.  RISK FACTORS  Having unprotected sexual intercourse.  Having sexual intercourse with an infected partner. SIGNS AND SYMPTOMS  Symptoms of trichomoniasis in women include:  Abnormal gray-green frothy vaginal discharge.  Itching and irritation of the vagina.  Itching and irritation of the area outside the vagina. Symptoms of trichomoniasis in men include:   Penile discharge with or without pain.  Pain during urination. This results from inflammation of the urethra. DIAGNOSIS  Trichomoniasis may be found during a Pap test or physical exam. Your health care provider may use one of the following methods to help diagnose this infection:  Testing the pH of the vagina with a test tape.  Using a vaginal swab test that checks for the Trichomonas organism. A test is available that provides results within a few minutes.  Examining a urine sample.  Testing vaginal secretions. Your health care provider may test you for other STIs, including HIV. TREATMENT   You may be given medicine to fight the infection. Women should inform their health care provider if they could be or are pregnant. Some medicines used to treat the infection should not be taken during pregnancy.  Your health care provider may recommend over-the-counter medicines or creams to decrease itching or irritation.  Your sexual partner will need to be treated if infected.  Your health care provider may test you for infection again 3 months after treatment. HOME CARE INSTRUCTIONS   Take medicines only as  directed by your health care provider.  Take over-the-counter medicine for itching or irritation as directed by your health care provider.  Do not have sexual intercourse while you have the infection.  Women should not douche or wear tampons while they have the infection.  Discuss your infection with your partner. Your partner may have gotten the infection from you, or you may have gotten it from your partner.  Have your sex partner get examined and treated if necessary.  Practice safe, informed, and protected sex.  See your health care provider for other STI testing. SEEK MEDICAL CARE IF:   You still have symptoms after you finish your medicine.  You develop abdominal pain.  You have pain when you urinate.  You have bleeding after sexual intercourse.  You develop a rash.  Your medicine makes you sick or makes you throw up (vomit). MAKE SURE YOU:  Understand these instructions.  Will watch your condition.  Will get help right away if you are not doing well or get worse.   This information is not intended to replace advice given to you by your health care provider. Make sure you discuss any questions you have with your health care provider.   Document Released: 04/17/2001 Document Revised: 11/12/2014 Document Reviewed: 08/03/2013 Elsevier Interactive Patient Education 2016 Elsevier Inc. Monilial Vaginitis Vaginitis in a soreness, swelling and redness (inflammation) of the vagina and vulva. Monilial vaginitis is not a sexually transmitted infection. CAUSES  Yeast vaginitis is caused by yeast (candida) that is normally found in your vagina. With a yeast infection, the candida has overgrown in number to a point that upsets the chemical balance. SYMPTOMS     White, thick vaginal discharge.  Swelling, itching, redness and irritation of the vagina and possibly the lips of the vagina (vulva).  Burning or painful urination.  Painful intercourse. DIAGNOSIS  Things that may  contribute to monilial vaginitis are:  Postmenopausal and virginal states.  Pregnancy.  Infections.  Being tired, sick or stressed, especially if you had monilial vaginitis in the past.  Diabetes. Good control will help lower the chance.  Birth control pills.  Tight fitting garments.  Using bubble bath, feminine sprays, douches or deodorant tampons.  Taking certain medications that kill germs (antibiotics).  Sporadic recurrence can occur if you become ill. TREATMENT  Your caregiver will give you medication.  There are several kinds of anti monilial vaginal creams and suppositories specific for monilial vaginitis. For recurrent yeast infections, use a suppository or cream in the vagina 2 times a week, or as directed.  Anti-monilial or steroid cream for the itching or irritation of the vulva may also be used. Get your caregiver's permission.  Painting the vagina with methylene blue solution may help if the monilial cream does not work.  Eating yogurt may help prevent monilial vaginitis. HOME CARE INSTRUCTIONS   Finish all medication as prescribed.  Do not have sex until treatment is completed or after your caregiver tells you it is okay.  Take warm sitz baths.  Do not douche.  Do not use tampons, especially scented ones.  Wear cotton underwear.  Avoid tight pants and panty hose.  Tell your sexual partner that you have a yeast infection. They should go to their caregiver if they have symptoms such as mild rash or itching.  Your sexual partner should be treated as well if your infection is difficult to eliminate.  Practice safer sex. Use condoms.  Some vaginal medications cause latex condoms to fail. Vaginal medications that harm condoms are:  Cleocin cream.  Butoconazole (Femstat).  Terconazole (Terazol) vaginal suppository.  Miconazole (Monistat) (may be purchased over the counter). SEEK MEDICAL CARE IF:   You have a temperature by mouth above 102 F  (38.9 C).  The infection is getting worse after 2 days of treatment.  The infection is not getting better after 3 days of treatment.  You develop blisters in or around your vagina.  You develop vaginal bleeding, and it is not your menstrual period.  You have pain when you urinate.  You develop intestinal problems.  You have pain with sexual intercourse.   This information is not intended to replace advice given to you by your health care provider. Make sure you discuss any questions you have with your health care provider.   Document Released: 08/01/2005 Document Revised: 01/14/2012 Document Reviewed: 04/25/2015 Elsevier Interactive Patient Education 2016 Elsevier Inc.  

## 2016-07-24 LAB — GC/CHLAMYDIA PROBE AMP (~~LOC~~) NOT AT ARMC
CHLAMYDIA, DNA PROBE: NEGATIVE
Neisseria Gonorrhea: NEGATIVE

## 2016-09-09 ENCOUNTER — Emergency Department (HOSPITAL_COMMUNITY)
Admission: EM | Admit: 2016-09-09 | Discharge: 2016-09-09 | Disposition: A | Discharge status: Home / Self Care | Payer: Medicaid Other | Attending: Emergency Medicine | Admitting: Emergency Medicine

## 2016-09-09 ENCOUNTER — Encounter (HOSPITAL_COMMUNITY): Payer: Self-pay | Admitting: *Deleted

## 2016-09-09 ENCOUNTER — Emergency Department (HOSPITAL_COMMUNITY): Payer: Medicaid Other

## 2016-09-09 DIAGNOSIS — Z79899 Other long term (current) drug therapy: Secondary | ICD-10-CM | POA: Insufficient documentation

## 2016-09-09 DIAGNOSIS — E119 Type 2 diabetes mellitus without complications: Secondary | ICD-10-CM | POA: Insufficient documentation

## 2016-09-09 DIAGNOSIS — J40 Bronchitis, not specified as acute or chronic: Secondary | ICD-10-CM | POA: Insufficient documentation

## 2016-09-09 DIAGNOSIS — J029 Acute pharyngitis, unspecified: Secondary | ICD-10-CM | POA: Diagnosis present

## 2016-09-09 MED ORDER — PREDNISONE 20 MG PO TABS
40.0000 mg | ORAL_TABLET | Freq: Once | ORAL | Status: AC
  Administered 2016-09-09: 40 mg via ORAL
  Filled 2016-09-09: qty 2

## 2016-09-09 MED ORDER — ALBUTEROL SULFATE HFA 108 (90 BASE) MCG/ACT IN AERS
1.0000 | INHALATION_SPRAY | Freq: Once | RESPIRATORY_TRACT | Status: AC
Start: 2016-09-09 — End: 2016-09-09
  Administered 2016-09-09: 2 via RESPIRATORY_TRACT
  Filled 2016-09-09: qty 6.7

## 2016-09-09 MED ORDER — PREDNISONE 20 MG PO TABS: 40.0000 mg | ORAL_TABLET | Freq: Every day | ORAL | 0 refills | Status: DC

## 2016-09-09 MED ORDER — BENZONATATE 100 MG PO CAPS
100.0000 mg | ORAL_CAPSULE | Freq: Once | ORAL | Status: AC
  Administered 2016-09-09: 100 mg via ORAL
  Filled 2016-09-09: qty 1

## 2016-09-09 MED ORDER — BENZONATATE 100 MG PO CAPS: 100.0000 mg | ORAL_CAPSULE | Freq: Three times a day (TID) | ORAL | 0 refills | Status: DC | PRN

## 2016-09-09 NOTE — ED Triage Notes (Signed)
2-3 weeks of swollen lymph nodes in neck with throat hurting, Thursday, URI symptoms started, cough, stuffiness

## 2016-09-09 NOTE — ED Provider Notes (Signed)
[facility]-EMERGENCY DEPT Provider Note   CSN: [id number] Arrival date & time: 09/09/16  1702  By signing my name below, I, Rosario Adie, attest that this documentation has been prepared under the direction and in the presence of Demetrios Loll, PA-C.  Electronically Signed: Rosario Adie, ED Scribe. 09/09/16. 7:04 PM.  History   Chief Complaint Chief Complaint  Patient presents with  . Cough  . Sore Throat   The history is provided by the patient. No language interpreter was used.    HPI Comments: Jaime Ramos is a 20 y.o. female with a PMHx of DM, who presents to the Emergency Department complaining of gradually worsening, persistent dry cough onset approximately 3 days ago. She reports associated sore throat, rhinorrhea, diffuse myalgias, chills, subjective fever, sinus pressure, nasal congestion, and mild SOB as well. Pt additionally notes chest pain secondary to only her cough. She states that she has a h/o asthma, and her symptoms today are somewhat consistent with her usual flare ups. No noted treatments were tried prior to coming into the ED. 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 She denies difficulty swallowing, or any other associated symptoms.   Past Medical History:  Diagnosis Date  . Anxiety   . Asthma   . Chlamydia   . Depression    doing good  . Diabetes mellitus without complication (HCC)   . Obesity   . Precocious puberty   . Prediabetes   . Trichomonal vaginitis    Patient Active Problem List   Diagnosis Date Noted  . MDD (major depressive disorder), recurrent episode, moderate (HCC) 01/25/2016  . Chlamydia 04/27/2015  . Nexplanon removal 02/09/2015  . Preterm delivery 10/10/2013  . Preterm labor 10/09/2013  . Depression 10/02/2013  . Vaginal bleeding before [redacted] weeks gestation 10/02/2013  . Acanthosis 05/28/2012  . Menorrhagia with regular cycle 07/31/2011  .  Goiter 07/31/2011  . Prediabetes   . Obesity 05/17/2011   Past Surgical History:  Procedure Laterality Date  . DILATION AND CURETTAGE OF UTERUS N/A 11/09/2014   Procedure: DILATATION AND CURETTAGE;  Surgeon: Allie Bossier, MD;  Location: [facility];  Service: Gynecology;  Laterality: N/A;  . DILATION AND CURETTAGE OF UTERUS  2016   OB History    Gravida Para Term Preterm AB Living   2 1   1 1  0   SAB TAB Ectopic Multiple Live Births   1       1     Home Medications    Prior to Admission medications   Medication Sig Start Date End Date Taking? Authorizing Provider  fluconazole (DIFLUCAN) 150 MG tablet Take 1 tablet (150 mg total) by mouth daily. 07/23/16   Judeth Horn, NP  naproxen sodium (ANAPROX) 220 MG tablet Take 220 mg by mouth 2 (two) times daily with a meal.    Historical Provider, MD   Family History Family History  Problem Relation Age of Onset  . Diabetes Mother   . Hypertension Mother   . Obesity Mother   . Fibroids Mother   . Cancer Mother     thyroid  . Obesity Father   . Obesity Maternal Aunt   . Fibroids Maternal Aunt   . Obesity Maternal Uncle   . Obesity Paternal Aunt   . Diabetes Maternal Grandmother   . Obesity Maternal Grandmother   . Hypertension Maternal Grandmother   . Diabetes Maternal Grandfather   .  Obesity Maternal Grandfather   . Stroke Maternal Grandfather   . Heart disease Maternal Grandfather   . Hypertension Maternal Grandfather   . Obesity Paternal Grandfather   . Heart disease Paternal Grandfather   . Bipolar disorder Brother   . Bipolar disorder Brother    Social History Social History  Substance Use Topics  . Smoking status: Never Smoker  . Smokeless tobacco: Never Used  . Alcohol use No   Allergies   Patient has no known allergies.  Review of Systems Review of Systems  Constitutional: Positive for chills and fever (subjective).  HENT: Positive for congestion (nasal), ear pain, rhinorrhea, sinus pressure and sore throat.  Negative for trouble swallowing.   Respiratory: Positive for cough and shortness of breath.   Cardiovascular: Positive for chest pain (secondary to cough).  Musculoskeletal: Positive for myalgias.  All other systems reviewed and are negative.  Physical Exam Updated Vital Signs BP 115/72 (BP Location: Right Arm)   Pulse 87   Temp 99.8 F (37.7 C) (Oral)   Resp 18   Ht 5\' 6"  (1.676 m)   Wt 108.9 kg   LMP 09/06/2016   SpO2 100%   BMI 38.74 kg/m   Physical Exam  Constitutional: She appears well-developed and well-nourished.  HENT:  Head: Normocephalic.  Right Ear: Tympanic membrane, external ear and ear canal normal. Tympanic membrane is not erythematous, not retracted and not bulging.  Left Ear: Tympanic membrane, external ear and ear canal normal. Tympanic membrane is not erythematous, not retracted and not bulging.  Nose: Mucosal edema and rhinorrhea present. Right sinus exhibits maxillary sinus tenderness and frontal sinus tenderness. Left sinus exhibits maxillary sinus tenderness and frontal sinus tenderness.  Mouth/Throat: Uvula is midline, oropharynx is clear and moist and mucous membranes are normal. No trismus in the jaw. No uvula swelling. No oropharyngeal exudate, posterior oropharyngeal edema, posterior oropharyngeal erythema or tonsillar abscesses. Tonsils are 1+ on the right. Tonsils are 1+ on the left. No tonsillar exudate.  Eyes: Conjunctivae are normal.  Neck: Normal range of motion. Neck supple.  Cardiovascular: Normal rate, regular rhythm, normal heart sounds and intact distal pulses.  Exam reveals no gallop and no friction rub.   No murmur heard. Pulmonary/Chest: Effort normal and breath sounds normal. No respiratory distress. She has no wheezes. She has no rales.  Non-productive cough on exam.   Abdominal: She exhibits no distension.  Musculoskeletal: Normal range of motion.  Lymphadenopathy:    She has no cervical adenopathy.  Neurological: She is alert.    Skin: Skin is warm and dry.  Psychiatric: She has a normal mood and affect. Her behavior is normal.  Nursing note and vitals reviewed.  ED Treatments / Results  DIAGNOSTIC STUDIES: Oxygen Saturation is 99% on RA, normal by my interpretation.   COORDINATION OF CARE: 6:54 PM-Discussed next steps with pt. Pt verbalized understanding and is agreeable with the plan.   Radiology Dg Chest 2 View  Result Date: 09/09/2016 CLINICAL DATA:  Productive cough and fever since November 2. Nonsmoker. EXAM: CHEST  2 VIEW COMPARISON:  07/26/2015 chest CT FINDINGS: The heart size and mediastinal contours are within normal limits. Both lungs are clear. The visualized skeletal structures are unremarkable. IMPRESSION: No active cardiopulmonary disease. Electronically Signed   By: Tollie Ethavid  Kwon M.D.   On: 09/09/2016 19:52    Procedures Procedures   Medications Ordered in ED Medications  benzonatate (TESSALON) capsule 100 mg (not administered)  predniSONE (DELTASONE) tablet 40 mg (not administered)  albuterol (PROVENTIL  HFA;VENTOLIN HFA) 108 (90 Base) MCG/ACT inhaler 1-2 puff (not administered)   Initial Impression / Assessment and Plan / ED Course  I have reviewed the triage vital signs and the nursing notes.  Pertinent labs & imaging results that were available during my care of the patient were reviewed by me and considered in my medical decision making (see chart for details).  Clinical Course    Patient with symptoms consistent with influenza vs bronchitis. Vitals are stable, afebrile in the ED. No signs of dehydration, tolerating PO's. Lungs are clear.CXR was unremarkable.. Discussed the cost versus benefit of Tamiflu treatment with the patient. The patient understands that symptoms are greater than the recommended 24-48 hour window of treatment. Patient will be discharged with instructions to orally hydrate, rest, and use over-the-counter medications such as anti-inflammatories ibuprofen and Aleve  for muscle aches, albuterol inhaler for her breathing issues, and Tylenol for fever. Patient will also be given a cough suppressant. Pt is comfortable with above plan and is stable for discharge at this time. All questions were answered prior to disposition. Strict return precautions for f/u into the ED were discussed.   Final Clinical Impressions(s) / ED Diagnoses   Final diagnoses:  Bronchitis   New Prescriptions Discharge Medication List as of 09/09/2016  8:07 PM    START taking these medications   Details  benzonatate (TESSALON PERLES) 100 MG capsule Take 1 capsule (100 mg total) by mouth 3 (three) times daily as needed for cough., Starting Sun 09/09/2016, Print    predniSONE (DELTASONE) 20 MG tablet Take 2 tablets (40 mg total) by mouth daily with breakfast., Starting Sun 09/09/2016, Print       I personally performed the services described in this documentation, which was scribed in my presence. The recorded information has been reviewed and is accurate.      Rise MuKenneth T Pearlene Teat, PA-C 09/10/16 1428    Maia PlanJoshua G Long, MD 09/11/16 [phone]

## 2016-09-09 NOTE — ED Notes (Signed)
Patient transported to X-ray 

## 2016-09-09 NOTE — Discharge Instructions (Signed)
Your chest x-ray was normal today. This is likely bronchitis giving her history of asthma. You could also have a viral upper respiratory tract infection. You may use symptomatic treatment at home.Please take the prednisone once a day for 4 days. She may take Tessalon as needed for cough. Use the inhaler as needed. You to follow-up with a primary care doctor I have given the financial resource guide to find a primary care doctor in the area. Return to the ED if your symptoms worsen.

## 2016-11-14 ENCOUNTER — Inpatient Hospital Stay (HOSPITAL_COMMUNITY)
Admission: AD | Admit: 2016-11-14 | Discharge: 2016-11-14 | Disposition: A | Discharge status: Home / Self Care | Payer: Medicaid Other | Source: Ambulatory Visit | Attending: Obstetrics and Gynecology | Admitting: Obstetrics and Gynecology

## 2016-11-14 ENCOUNTER — Encounter (HOSPITAL_COMMUNITY): Payer: Self-pay

## 2016-11-14 DIAGNOSIS — F419 Anxiety disorder, unspecified: Secondary | ICD-10-CM | POA: Insufficient documentation

## 2016-11-14 DIAGNOSIS — F329 Major depressive disorder, single episode, unspecified: Secondary | ICD-10-CM | POA: Insufficient documentation

## 2016-11-14 DIAGNOSIS — B9689 Other specified bacterial agents as the cause of diseases classified elsewhere: Secondary | ICD-10-CM

## 2016-11-14 DIAGNOSIS — R300 Dysuria: Secondary | ICD-10-CM

## 2016-11-14 DIAGNOSIS — E669 Obesity, unspecified: Secondary | ICD-10-CM | POA: Insufficient documentation

## 2016-11-14 DIAGNOSIS — A5901 Trichomonal vulvovaginitis: Secondary | ICD-10-CM | POA: Insufficient documentation

## 2016-11-14 DIAGNOSIS — E119 Type 2 diabetes mellitus without complications: Secondary | ICD-10-CM | POA: Insufficient documentation

## 2016-11-14 DIAGNOSIS — J45909 Unspecified asthma, uncomplicated: Secondary | ICD-10-CM | POA: Insufficient documentation

## 2016-11-14 DIAGNOSIS — N760 Acute vaginitis: Secondary | ICD-10-CM

## 2016-11-14 LAB — URINALYSIS, ROUTINE W REFLEX MICROSCOPIC
BACTERIA UA: NONE SEEN
Bilirubin Urine: NEGATIVE
Glucose, UA: NEGATIVE mg/dL
Hgb urine dipstick: NEGATIVE
Ketones, ur: NEGATIVE mg/dL
Nitrite: NEGATIVE
PROTEIN: 30 mg/dL — AB
Specific Gravity, Urine: 1.027 (ref 1.005–1.030)
pH: 6 (ref 5.0–8.0)

## 2016-11-14 LAB — WET PREP, GENITAL
Sperm: NONE SEEN
TRICH WET PREP: NONE SEEN
Yeast Wet Prep HPF POC: NONE SEEN

## 2016-11-14 LAB — POCT PREGNANCY, URINE: PREG TEST UR: NEGATIVE

## 2016-11-14 MED ORDER — METRONIDAZOLE 500 MG PO TABS: 500.0000 mg | ORAL_TABLET | Freq: Two times a day (BID) | ORAL | 0 refills | Status: DC

## 2016-11-14 NOTE — Discharge Instructions (Signed)
Dysuria Introduction Dysuria is pain or discomfort while urinating. The pain or discomfort may be felt in the tube that carries urine out of the bladder (urethra) or in the surrounding tissue of the genitals. The pain may also be felt in the groin area, lower abdomen, and lower back. You may have to urinate frequently or have the sudden feeling that you have to urinate (urgency). Dysuria can affect both men and women, but is more common in women. Dysuria can be caused by many different things, including:  Urinary tract infection in women.  Infection of the kidney or bladder.  Kidney stones or bladder stones.  Certain sexually transmitted infections (STIs), such as chlamydia.  Dehydration.  Inflammation of the vagina.  Use of certain medicines.  Use of certain soaps or scented products that cause irritation. Follow these instructions at home: Watch your dysuria for any changes. The following actions may help to reduce any discomfort you are feeling:  Drink enough fluid to keep your urine clear or pale yellow.  Empty your bladder often. Avoid holding urine for long periods of time.  After a bowel movement or urination, women should cleanse from front to back, using each tissue only once.  Empty your bladder after sexual intercourse.  Take medicines only as directed by your health care provider.  If you were prescribed an antibiotic medicine, finish it all even if you start to feel better.  Avoid caffeine, tea, and alcohol. They can irritate the bladder and make dysuria worse. In men, alcohol may irritate the prostate.  Keep all follow-up visits as directed by your health care provider. This is important.  If you had any tests done to find the cause of dysuria, it is your responsibility to obtain your test results. Ask the lab or department performing the test when and how you will get your results. Talk with your health care provider if you have any questions about your  results. Contact a health care provider if:  You develop pain in your back or sides.  You have a fever.  You have nausea or vomiting.  You have blood in your urine.  You are not urinating as often as you usually do. Get help right away if:  You pain is severe and not relieved with medicines.  You are unable to hold down any fluids.  You or someone else notices a change in your mental function.  You have a rapid heartbeat at rest.  You have shaking or chills.  You feel extremely weak. This information is not intended to replace advice given to you by your health care provider. Make sure you discuss any questions you have with your health care provider. Document Released: 07/20/2004 Document Revised: 03/29/2016 Document Reviewed: 06/17/2014  2017 Elsevier   Bacterial Vaginosis Bacterial vaginosis is an infection of the vagina. It happens when too many germs (bacteria) grow in the vagina. This infection puts you at risk for infections from sex (STIs). Treating this infection can lower your risk for some STIs. You should also treat this if you are pregnant. It can cause your baby to be born early. Follow these instructions at home: Medicines  Take over-the-counter and prescription medicines only as told by your doctor.  Take or use your antibiotic medicine as told by your doctor. Do not stop taking or using it even if you start to feel better. General instructions  If you your sexual partner is a woman, tell her that you have this infection. She needs to  get treatment if she has symptoms. If you have a female partner, he does not need to be treated.  During treatment:  Avoid sex.  Do not douche.  Avoid alcohol as told.  Avoid breastfeeding as told.  Drink enough fluid to keep your pee (urine) clear or pale yellow.  Keep your vagina and butt (rectum) clean.  Wash the area with warm water every day.  Wipe from front to back after you use the toilet.  Keep all  follow-up visits as told by your doctor. This is important. Preventing this condition  Do not douche.  Use only warm water to wash around your vagina.  Use protection when you have sex. This includes:  Latex condoms.  Dental dams.  Limit how many people you have sex with. It is best to only have sex with the same person (be monogamous).  Get tested for STIs. Have your partner get tested.  Wear underwear that is cotton or lined with cotton.  Avoid tight pants and pantyhose. This is most important in summer.  Do not use any products that have nicotine or tobacco in them. These include cigarettes and e-cigarettes. If you need help quitting, ask your doctor.  Do not use illegal drugs.  Limit how much alcohol you drink. Contact a doctor if:  Your symptoms do not get better, even after you are treated.  You have more discharge or pain when you pee (urinate).  You have a fever.  You have pain in your belly (abdomen).  You have pain with sex.  Your bleed from your vagina between periods. Summary  This infection happens when too many germs (bacteria) grow in the vagina.  Treating this condition can lower your risk for some infections from sex (STIs).  You should also treat this if you are pregnant. It can cause early (premature) birth.  Do not stop taking or using your antibiotic medicine even if you start to feel better. This information is not intended to replace advice given to you by your health care provider. Make sure you discuss any questions you have with your health care provider. Document Released: 07/31/2008 Document Revised: 07/07/2016 Document Reviewed: 07/07/2016 Elsevier Interactive Patient Education  2017 [employer]

## 2016-11-14 NOTE — MAU Note (Signed)
Pt reports burning with urination and urinary urgency x 2-3 days,  Denies fever.

## 2016-11-14 NOTE — MAU Provider Note (Signed)
History     CSN: [id number]  Arrival date and time: 11/14/16 1906   First Provider Initiated Contact with Patient 11/14/16 2234      Chief Complaint  Patient presents with  . Dysuria   Jaime Ramos is a 21 y.o. Female who presents for dysuria.    Dysuria   This is a new problem. Episode onset: x 2 days. The problem occurs every urination. The problem has been unchanged. The quality of the pain is described as burning. There has been no fever. She is sexually active. There is no history of pyelonephritis. Associated symptoms include a discharge and frequency. Pertinent negatives include no chills, flank pain, hematuria, nausea, possible pregnancy, urgency or vomiting. She has tried nothing for the symptoms. There is no history of catheterization, recurrent UTIs or a urological procedure.    OB History    Gravida Para Term Preterm AB Living   2 1   1 1  0   SAB TAB Ectopic Multiple Live Births   1       1      Past Medical History:  Diagnosis Date  . Anxiety   . Asthma   . Chlamydia   . Depression    doing good  . Diabetes mellitus without complication (HCC)   . Obesity   . Precocious puberty   . Prediabetes   . Trichomonal vaginitis     Past Surgical History:  Procedure Laterality Date  . DILATION AND CURETTAGE OF UTERUS N/A 11/09/2014   Procedure: DILATATION AND CURETTAGE;  Surgeon: [name], MD;  Location: [facility];  Service: Gynecology;  Laterality: N/A;  . DILATION AND CURETTAGE OF UTERUS  2016    Family History  Problem Relation Age of Onset  . Diabetes Mother   . Hypertension Mother   . Obesity Mother   . Fibroids Mother   . Cancer Mother     thyroid  . Obesity Father   . Obesity Maternal Aunt   . Fibroids Maternal Aunt   . Obesity Maternal Uncle   . Obesity Paternal Aunt   . Diabetes Maternal Grandmother   . Obesity Maternal Grandmother   . Hypertension Maternal Grandmother   . Diabetes Maternal Grandfather   . Obesity Maternal Grandfather   .  Stroke Maternal Grandfather   . Heart disease Maternal Grandfather   . Hypertension Maternal Grandfather   . Obesity Paternal Grandfather   . Heart disease Paternal Grandfather   . Bipolar disorder Brother   . Bipolar disorder Brother     Social History  Substance Use Topics  . Smoking status: Never Smoker  . Smokeless tobacco: Never Used  . Alcohol use No    Allergies: No Known Allergies  Prescriptions Prior to Admission  Medication Sig Dispense Refill Last Dose  . benzonatate (TESSALON PERLES) 100 MG capsule Take 1 capsule (100 mg total) by mouth 3 (three) times daily as needed for cough. (Patient not taking: Reported on 11/14/2016) 20 capsule 0 Not Taking at Unknown time  . fluconazole (DIFLUCAN) 150 MG tablet Take 1 tablet (150 mg total) by mouth daily. (Patient not taking: Reported on 11/14/2016) 1 tablet 1 Not Taking at Unknown time  . naproxen sodium (ANAPROX) 220 MG tablet Take 220 mg by mouth 2 (two) times daily with a meal.   Not Taking at Unknown time  . predniSONE (DELTASONE) 20 MG tablet Take 2 tablets (40 mg total) by mouth daily with breakfast. (Patient not taking: Reported on 11/14/2016) 8 tablet 0  Not Taking at Unknown time    Review of Systems  Constitutional: Negative for chills and fever.  Gastrointestinal: Negative.  Negative for nausea and vomiting.  Genitourinary: Positive for dysuria, frequency and vaginal discharge. Negative for difficulty urinating, dyspareunia, flank pain, hematuria, urgency and vaginal bleeding.   Physical Exam   Blood pressure 125/64, pulse 83, temperature 98.3 F (36.8 C), temperature source Oral, resp. rate 17, height 5\' 6"  (1.676 m), weight 268 lb (121.6 kg), last menstrual period 11/07/2016, SpO2 100 %.  Physical Exam  Nursing note and vitals reviewed. Constitutional: She is oriented to person, place, and time. She appears well-developed and well-nourished. No distress.  HENT:  Head: Normocephalic and atraumatic.  Eyes:  Conjunctivae are normal. Right eye exhibits no discharge. Left eye exhibits no discharge. No scleral icterus.  Neck: Normal range of motion.  Respiratory: Effort normal. No respiratory distress.  GI: Soft. She exhibits no distension. There is no tenderness. There is no rebound, no guarding and no CVA tenderness.  Genitourinary: There is no rash, tenderness or lesion on the right labia. There is no rash, tenderness or lesion on the left labia.  Neurological: She is alert and oriented to person, place, and time.  Skin: Skin is warm and dry. She is not diaphoretic.  Psychiatric: She has a normal mood and affect. Her behavior is normal. Judgment and thought content normal.    MAU Course  Procedures Results for orders placed or performed during the hospital encounter of 11/14/16 (from the past 24 hour(s))  Urinalysis, Routine w reflex microscopic     Status: Abnormal   Collection Time: 11/14/16  7:35 PM  Result Value Ref Range   Color, Urine YELLOW YELLOW   APPearance HAZY (A) CLEAR   Specific Gravity, Urine 1.027 1.005 - 1.030   pH 6.0 5.0 - 8.0   Glucose, UA NEGATIVE NEGATIVE mg/dL   Hgb urine dipstick NEGATIVE NEGATIVE   Bilirubin Urine NEGATIVE NEGATIVE   Ketones, ur NEGATIVE NEGATIVE mg/dL   Protein, ur 30 (A) NEGATIVE mg/dL   Nitrite NEGATIVE NEGATIVE   Leukocytes, UA SMALL (A) NEGATIVE   RBC / HPF 0-5 0 - 5 RBC/hpf   WBC, UA 0-5 0 - 5 WBC/hpf   Bacteria, UA NONE SEEN NONE SEEN   Squamous Epithelial / LPF 6-30 (A) NONE SEEN   Mucous PRESENT   Pregnancy, urine POC     Status: None   Collection Time: 11/14/16  7:55 PM  Result Value Ref Range   Preg Test, Ur NEGATIVE NEGATIVE  Wet prep, genital     Status: Abnormal   Collection Time: 11/14/16  9:16 PM  Result Value Ref Range   Yeast Wet Prep HPF POC NONE SEEN NONE SEEN   Trich, Wet Prep NONE SEEN NONE SEEN   Clue Cells Wet Prep HPF POC PRESENT (A) NONE SEEN   WBC, Wet Prep HPF POC FEW (A) NONE SEEN   Sperm NONE SEEN      MDM UPT negative U/a not consistent with UTI -- will send for urine cx based on symptoms GC/CT & wet prep  Assessment and Plan  A:  1. BV (bacterial vaginosis)   2. Dysuria    P: Discharge home Rx flagyl Urine culture & GC/CT pending F/u with [facility] as scheduled or contact PCP as needed  Judeth Horn 11/14/2016, 10:34 PM

## 2016-11-15 LAB — GC/CHLAMYDIA PROBE AMP (~~LOC~~) NOT AT ARMC
Chlamydia: NEGATIVE
NEISSERIA GONORRHEA: NEGATIVE

## 2016-11-16 ENCOUNTER — Inpatient Hospital Stay (HOSPITAL_COMMUNITY)
Admission: AD | Admit: 2016-11-16 | Discharge: 2016-11-16 | Disposition: A | Discharge status: Home / Self Care | Payer: Medicaid Other | Source: Ambulatory Visit | Attending: Obstetrics & Gynecology | Admitting: Obstetrics & Gynecology

## 2016-11-16 DIAGNOSIS — F419 Anxiety disorder, unspecified: Secondary | ICD-10-CM | POA: Insufficient documentation

## 2016-11-16 DIAGNOSIS — J45909 Unspecified asthma, uncomplicated: Secondary | ICD-10-CM | POA: Insufficient documentation

## 2016-11-16 DIAGNOSIS — A5901 Trichomonal vulvovaginitis: Secondary | ICD-10-CM | POA: Insufficient documentation

## 2016-11-16 DIAGNOSIS — E669 Obesity, unspecified: Secondary | ICD-10-CM | POA: Insufficient documentation

## 2016-11-16 DIAGNOSIS — F329 Major depressive disorder, single episode, unspecified: Secondary | ICD-10-CM | POA: Insufficient documentation

## 2016-11-16 DIAGNOSIS — E119 Type 2 diabetes mellitus without complications: Secondary | ICD-10-CM | POA: Insufficient documentation

## 2016-11-16 DIAGNOSIS — Z202 Contact with and (suspected) exposure to infections with a predominantly sexual mode of transmission: Secondary | ICD-10-CM | POA: Insufficient documentation

## 2016-11-16 LAB — URINE CULTURE

## 2016-11-16 MED ORDER — AZITHROMYCIN 250 MG PO TABS
1000.0000 mg | ORAL_TABLET | Freq: Once | ORAL | Status: AC
  Administered 2016-11-16: 1000 mg via ORAL
  Filled 2016-11-16: qty 4

## 2016-11-16 NOTE — Progress Notes (Signed)
Written and verbal d/c instructions given and understanding voiced. Will wait in lobby after med given

## 2016-11-16 NOTE — MAU Note (Signed)
I was seen here few days ago and told had BV. Boyfriend called today and told me he has chlamydia and I need to be treated. Taking flagyl for BV starting today.

## 2016-11-16 NOTE — MAU Note (Signed)
Pt tol med well and d/c home from lobby

## 2016-11-16 NOTE — MAU Provider Note (Signed)
History     CSN: [id number]  Arrival date and time: 11/16/16 2146    First Provider Initiated Contact with Patient 11/16/16 2217        Chief Complaint  Patient presents with  . Possible exposure to std   HPI Jaime Ramos is a 21 y.o. female who presents for exposure to chlamydia. Patient was seen in MAU for evaluation 2 days ago & tested for gc/ct -- results negative. States she was called by her boyfriend this morning & told that he tested positive for chlamydia. Reports continued vaginal discharge but not changes in symptoms. Is here for treatment.   Past Medical History:  Diagnosis Date  . Anxiety   . Asthma   . Chlamydia   . Depression    doing good  . Diabetes mellitus without complication (HCC)   . Obesity   . Precocious puberty   . Prediabetes   . Trichomonal vaginitis     Past Surgical History:  Procedure Laterality Date  . DILATION AND CURETTAGE OF UTERUS N/A 11/09/2014   Procedure: DILATATION AND CURETTAGE;  Surgeon: Allie Bossier, MD;  Location: [facility];  Service: Gynecology;  Laterality: N/A;  . DILATION AND CURETTAGE OF UTERUS  2016    Family History  Problem Relation Age of Onset  . Diabetes Mother   . Hypertension Mother   . Obesity Mother   . Fibroids Mother   . Cancer Mother     thyroid  . Obesity Father   . Obesity Maternal Aunt   . Fibroids Maternal Aunt   . Obesity Maternal Uncle   . Obesity Paternal Aunt   . Diabetes Maternal Grandmother   . Obesity Maternal Grandmother   . Hypertension Maternal Grandmother   . Diabetes Maternal Grandfather   . Obesity Maternal Grandfather   . Stroke Maternal Grandfather   . Heart disease Maternal Grandfather   . Hypertension Maternal Grandfather   . Obesity Paternal Grandfather   . Heart disease Paternal Grandfather   . Bipolar disorder Brother   . Bipolar disorder Brother     Social History  Substance Use Topics  . Smoking status: Never Smoker  . Smokeless tobacco: Never Used  . Alcohol use No     Allergies: No Known Allergies  Prescriptions Prior to Admission  Medication Sig Dispense Refill Last Dose  . metroNIDAZOLE (FLAGYL) 500 MG tablet Take 1 tablet (500 mg total) by mouth 2 (two) times daily. 14 tablet 0   . naproxen sodium (ANAPROX) 220 MG tablet Take 220 mg by mouth 2 (two) times daily with a meal.   Not Taking at Unknown time    Review of Systems  Constitutional: Negative.   Gastrointestinal: Negative.   Genitourinary: Positive for vaginal discharge. Negative for dysuria, vaginal bleeding and vaginal pain.   Physical Exam   Blood pressure 130/86, pulse 76, temperature 98.4 F (36.9 C), resp. rate 18, height 5\' 6"  (1.676 m), weight 271 lb 9.6 oz (123.2 kg), last menstrual period 11/07/2016.  Physical Exam  Nursing note and vitals reviewed. Constitutional: She is oriented to person, place, and time. She appears well-developed and well-nourished. No distress.  HENT:  Head: Normocephalic and atraumatic.  Eyes: Conjunctivae are normal. Right eye exhibits no discharge. Left eye exhibits no discharge. No scleral icterus.  Neck: Normal range of motion.  Respiratory: Effort normal. No respiratory distress.  Neurological: She is alert and oriented to person, place, and time.  Skin: Skin is warm and dry. She is not diaphoretic.  Psychiatric: She has a normal mood and affect. Her behavior is normal. Judgment and thought content normal.    MAU Course  Procedures  MDM Azithromycin 1 gm PO  Assessment and Plan  A: 1. Exposure to chlamydia    P: Discharge home No intercourse x 1 week after treatment Discussed safe sex practices F/u with [facility] as scheduled  Judeth Hornrin Meilech Virts 11/16/2016, 10:16 PM

## 2016-11-16 NOTE — Discharge Instructions (Signed)
Chlamydia, Female Chlamydia is an infection. It is spread through sexual contact. Chlamydia can be in different areas of the body. These areas include the cervix, urethra, throat, or rectum. You may not know you have chlamydia because many people never develop the symptoms. Chlamydia is not difficult to treat once you know you have it. However, if it is left untreated, chlamydia can lead to more serious health problems.  CAUSES  Chlamydia is caused by bacteria. It is a sexually transmitted disease. It is passed from an infected partner during intimate contact. This contact could be with the genitals, mouth, or rectal area. Chlamydia can also be passed from mothers to babies during birth. SIGNS AND SYMPTOMS  There may not be any symptoms. This is often the case early in the infection. If symptoms develop, they may include:  Mild pain and discomfort when urinating.  Redness, soreness, and swelling (inflammation) of the rectum.  Vaginal discharge.  Painful intercourse.  Abdominal pain.  Bleeding between menstrual periods. DIAGNOSIS  To diagnose this infection, your health care provider will do a pelvic exam. Cultures will be taken of the vagina, cervix, urine, and possibly the rectum to verify the diagnosis.  TREATMENT You will be given antibiotic medicines. If you are pregnant, certain types of antibiotics will need to be avoided. Any sexual partners should also be treated, even if they do not show symptoms. Your health care provider may test you for infection again 3 months after treatment. HOME CARE INSTRUCTIONS   Take your antibiotic medicine as directed by your health care provider. Finish the antibiotic even if you start to feel better.  Take medicines only as directed by your health care provider.  Inform any sexual partners about the infection. They should also be treated.  Do not have sexual contact until your health care provider tells you it is okay.  Get plenty of  rest.  Eat a well-balanced diet.  Drink enough fluids to keep your urine clear or pale yellow.  Keep all follow-up visits as directed by your health care provider. SEEK MEDICAL CARE IF:  You have painful urination.  You have abdominal pain.  You have vaginal discharge.  You have painful sexual intercourse.  You have bleeding between periods and after sex.  You have a fever. SEEK IMMEDIATE MEDICAL CARE IF:   You experience nausea or vomiting.  You experience excessive sweating (diaphoresis).  You have difficulty swallowing. This information is not intended to replace advice given to you by your health care provider. Make sure you discuss any questions you have with your health care provider. Document Released: 08/01/2005 Document Revised: 07/13/2015 Document Reviewed: 06/29/2013 Elsevier Interactive Patient Education  2017 [employer] Inc[employer]  

## 2016-11-25 ENCOUNTER — Emergency Department (HOSPITAL_COMMUNITY): Payer: Self-pay

## 2016-11-25 ENCOUNTER — Emergency Department (HOSPITAL_COMMUNITY)
Admission: EM | Admit: 2016-11-25 | Discharge: 2016-11-25 | Disposition: A | Discharge status: Home / Self Care | Payer: Self-pay | Attending: Emergency Medicine | Admitting: Emergency Medicine

## 2016-11-25 ENCOUNTER — Encounter (HOSPITAL_COMMUNITY): Payer: Self-pay | Admitting: Emergency Medicine

## 2016-11-25 DIAGNOSIS — Z79899 Other long term (current) drug therapy: Secondary | ICD-10-CM | POA: Insufficient documentation

## 2016-11-25 DIAGNOSIS — J40 Bronchitis, not specified as acute or chronic: Secondary | ICD-10-CM | POA: Insufficient documentation

## 2016-11-25 DIAGNOSIS — E119 Type 2 diabetes mellitus without complications: Secondary | ICD-10-CM | POA: Insufficient documentation

## 2016-11-25 LAB — URINALYSIS, ROUTINE W REFLEX MICROSCOPIC
BACTERIA UA: NONE SEEN
BILIRUBIN URINE: NEGATIVE
Glucose, UA: NEGATIVE mg/dL
HGB URINE DIPSTICK: NEGATIVE
Ketones, ur: 5 mg/dL — AB
LEUKOCYTES UA: NEGATIVE
NITRITE: NEGATIVE
PROTEIN: 30 mg/dL — AB
Specific Gravity, Urine: 1.03 (ref 1.005–1.030)
WBC UA: NONE SEEN WBC/hpf (ref 0–5)
pH: 5 (ref 5.0–8.0)

## 2016-11-25 LAB — COMPREHENSIVE METABOLIC PANEL
ALBUMIN: 3.6 g/dL (ref 3.5–5.0)
ALK PHOS: 67 U/L (ref 38–126)
ALT: 19 U/L (ref 14–54)
ANION GAP: 7 (ref 5–15)
AST: 32 U/L (ref 15–41)
BILIRUBIN TOTAL: 0.3 mg/dL (ref 0.3–1.2)
BUN: 8 mg/dL (ref 6–20)
CALCIUM: 8.3 mg/dL — AB (ref 8.9–10.3)
CO2: 26 mmol/L (ref 22–32)
Chloride: 103 mmol/L (ref 101–111)
Creatinine, Ser: 0.64 mg/dL (ref 0.44–1.00)
GLUCOSE: 130 mg/dL — AB (ref 65–99)
POTASSIUM: 2.9 mmol/L — AB (ref 3.5–5.1)
Sodium: 136 mmol/L (ref 135–145)
TOTAL PROTEIN: 7.3 g/dL (ref 6.5–8.1)

## 2016-11-25 LAB — CBC
HEMATOCRIT: 34.3 % — AB (ref 36.0–46.0)
HEMOGLOBIN: 10.5 g/dL — AB (ref 12.0–15.0)
MCH: 22.2 pg — ABNORMAL LOW (ref 26.0–34.0)
MCHC: 30.6 g/dL (ref 30.0–36.0)
MCV: 72.5 fL — ABNORMAL LOW (ref 78.0–100.0)
Platelets: 359 10*3/uL (ref 150–400)
RBC: 4.73 MIL/uL (ref 3.87–5.11)
RDW: 16.8 % — ABNORMAL HIGH (ref 11.5–15.5)
WBC: 5.8 10*3/uL (ref 4.0–10.5)

## 2016-11-25 LAB — I-STAT BETA HCG BLOOD, ED (MC, WL, AP ONLY)

## 2016-11-25 LAB — LIPASE, BLOOD: Lipase: 32 U/L (ref 11–51)

## 2016-11-25 MED ORDER — AZITHROMYCIN 250 MG PO TABS: 250.0000 mg | ORAL_TABLET | Freq: Every day | ORAL | 0 refills | Status: DC

## 2016-11-25 MED ORDER — POTASSIUM CHLORIDE CRYS ER 20 MEQ PO TBCR
40.0000 meq | EXTENDED_RELEASE_TABLET | Freq: Once | ORAL | Status: AC
  Administered 2016-11-25: 40 meq via ORAL
  Filled 2016-11-25: qty 2

## 2016-11-25 MED ORDER — BENZONATATE 100 MG PO CAPS: 100.0000 mg | ORAL_CAPSULE | Freq: Three times a day (TID) | ORAL | 0 refills | Status: DC

## 2016-11-25 NOTE — Discharge Instructions (Signed)
Return if any problems.

## 2016-11-25 NOTE — ED Triage Notes (Signed)
Patent c/o cough with yellow phlegm, body aches, chills and sweats, sinus pain since last Monday. patient started having diarrhea today. Patient states that she noticed her feet are swelling today and couldn't get her shoes on them.

## 2016-11-25 NOTE — ED Provider Notes (Signed)
[facility]-EMERGENCY DEPT Provider Note   CSN: [id number] Arrival date & time: 11/25/16  1603     History   Chief Complaint Chief Complaint  Patient presents with  . Cough  . Generalized Body Aches  . Diarrhea    HPI Jaime Ramos is a 21 y.o. female.  The history is provided by the patient. No language interpreter was used.  Cough  This is a new problem. The current episode started more than 1 week ago. The problem occurs constantly. The problem has been gradually worsening. The cough is productive of sputum. The fever has been present for 3 to 4 days. Pertinent negatives include no chest pain and no shortness of breath. She has tried nothing for the symptoms. The treatment provided no relief. She is not a smoker. Her past medical history does not include asthma.  Diarrhea   Associated symptoms include cough.  Pt reports symptoms began 1 week ago with bodyaches, cough and diarrhea.  Pt complains of continued cough.  Pt reports cough has continued for 1 week.  Pt reports she is coughing up yellow sputum.   Past Medical History:  Diagnosis Date  . Anxiety   . Asthma   . Chlamydia   . Depression    doing good  . Diabetes mellitus without complication (HCC)   . Obesity   . Precocious puberty   . Prediabetes   . Trichomonal vaginitis     Patient Active Problem List   Diagnosis Date Noted  . MDD (major depressive disorder), recurrent episode, moderate (HCC) 01/25/2016  . Chlamydia 04/27/2015  . Nexplanon removal 02/09/2015  . Preterm delivery 10/10/2013  . Preterm labor 10/09/2013  . Depression 10/02/2013  . Vaginal bleeding before [redacted] weeks gestation 10/02/2013  . Acanthosis 05/28/2012  . Menorrhagia with regular cycle 07/31/2011  . Goiter 07/31/2011  . Prediabetes   . Obesity 05/17/2011    Past Surgical History:  Procedure Laterality Date  . DILATION AND CURETTAGE OF UTERUS N/A 11/09/2014   Procedure: DILATATION AND CURETTAGE;  Surgeon: [name], MD;  Location: [facility]  ORS;  Service: Gynecology;  Laterality: N/A;  . DILATION AND CURETTAGE OF UTERUS  2016    OB History    Gravida Para Term Preterm AB Living   2 1   1 1  0   SAB TAB Ectopic Multiple Live Births   1       1       Home Medications    Prior to Admission medications   Medication Sig Start Date End Date Taking? Authorizing Provider  metroNIDAZOLE (FLAGYL) 500 MG tablet Take 1 tablet (500 mg total) by mouth 2 (two) times daily. 11/14/16   Judeth HornErin Lawrence, NP  naproxen sodium (ANAPROX) 220 MG tablet Take 220 mg by mouth 2 (two) times daily with a meal.    Historical Provider, MD  Pumpkin Seed-Soy Germ (AZO BLADDER CONTROL/GO-LESS PO) Take by mouth.    Historical Provider, MD    Family History Family History  Problem Relation Age of Onset  . Diabetes Mother   . Hypertension Mother   . Obesity Mother   . Fibroids Mother   . Cancer Mother     thyroid  . Obesity Father   . Obesity Maternal Aunt   . Fibroids Maternal Aunt   . Obesity Maternal Uncle   . Obesity Paternal Aunt   . Diabetes Maternal Grandmother   . Obesity Maternal Grandmother   . Hypertension Maternal Grandmother   . Diabetes Maternal Grandfather   .  Obesity Maternal Grandfather   . Stroke Maternal Grandfather   . Heart disease Maternal Grandfather   . Hypertension Maternal Grandfather   . Obesity Paternal Grandfather   . Heart disease Paternal Grandfather   . Bipolar disorder Brother   . Bipolar disorder Brother     Social History Social History  Substance Use Topics  . Smoking status: Never Smoker  . Smokeless tobacco: Never Used  . Alcohol use No     Allergies   Patient has no known allergies.   Review of Systems Review of Systems  Respiratory: Positive for cough. Negative for shortness of breath.   Cardiovascular: Negative for chest pain.  Gastrointestinal: Positive for diarrhea.  All other systems reviewed and are negative.    Physical Exam Updated Vital Signs BP 128/81   Pulse 101   Temp  100.3 F (37.9 C) (Oral)   Resp 18   LMP 11/07/2016   SpO2 100%   Physical Exam  Constitutional: She appears well-developed and well-nourished.  HENT:  Head: Normocephalic.  Right Ear: External ear normal.  Left Ear: External ear normal.  Nose: Nose normal.  Mouth/Throat: Oropharynx is clear and moist.  Eyes: Conjunctivae and EOM are normal. Pupils are equal, round, and reactive to light.  Neck: Normal range of motion. Neck supple.  Cardiovascular: Normal rate and regular rhythm.   Pulmonary/Chest: Effort normal.  Abdominal: Soft.  Musculoskeletal: Normal range of motion.  Neurological: She is alert.  Skin: Skin is warm.  Psychiatric: She has a normal mood and affect.  Vitals reviewed.    ED Treatments / Results  Labs (all labs ordered are listed, but only abnormal results are displayed) Labs Reviewed  COMPREHENSIVE METABOLIC PANEL - Abnormal; Notable for the following:       Result Value   Potassium 2.9 (*)    Glucose, Bld 130 (*)    Calcium 8.3 (*)    All other components within normal limits  CBC - Abnormal; Notable for the following:    Hemoglobin 10.5 (*)    HCT 34.3 (*)    MCV 72.5 (*)    MCH 22.2 (*)    RDW 16.8 (*)    All other components within normal limits  URINALYSIS, ROUTINE W REFLEX MICROSCOPIC - Abnormal; Notable for the following:    APPearance HAZY (*)    Ketones, ur 5 (*)    Protein, ur 30 (*)    Squamous Epithelial / LPF 0-5 (*)    All other components within normal limits  LIPASE, BLOOD  I-STAT BETA HCG BLOOD, ED ([facility], [facility], [facility] ONLY)    EKG  EKG Interpretation None       Radiology Dg Chest 2 View  Result Date: 11/25/2016 CLINICAL DATA:  Cough, fever, body aches, and nausea and vomiting for 3 days. Asthma. EXAM: CHEST  2 VIEW COMPARISON:  09/09/2016 FINDINGS: The heart size and mediastinal contours are within normal limits. Both lungs are clear. The visualized skeletal structures are unremarkable. IMPRESSION: Negative.  No active  cardiopulmonary disease. Electronically Signed   By: Myles Rosenthal M.D.   On: 11/25/2016 18:57    Procedures Procedures (including critical care time)  Medications Ordered in ED Medications - No data to display   Initial Impression / Assessment and Plan / ED Course  I have reviewed the triage vital signs and the nursing notes.  Pertinent labs & imaging results that were available during my care of the patient were reviewed by me and considered in my medical decision  making (see chart for details).     Pt has low potassium.  Pt given 40 meq po.   Pt advised multivitamin.  I will treat for possible bronchitis.    Final Clinical Impressions(s) / ED Diagnoses   Final diagnoses:  Bronchitis    New Prescriptions New Prescriptions   AZITHROMYCIN (ZITHROMAX) 250 MG TABLET    Take 1 tablet (250 mg total) by mouth daily. Take first 2 tablets together, then 1 every day until finished.   BENZONATATE (TESSALON) 100 MG CAPSULE    Take 1 capsule (100 mg total) by mouth every 8 (eight) hours.  An After Visit Summary was printed and given to the patient.   Lonia Skinner [place], PA-C 11/25/16 Margretta Ditty    Linwood Dibbles, MD 11/28/16 2019

## 2016-11-25 NOTE — ED Notes (Signed)
Patient transported to X-ray 

## 2016-11-29 ENCOUNTER — Other Ambulatory Visit: Payer: Self-pay | Admitting: Adult Health

## 2016-12-17 ENCOUNTER — Encounter: Payer: Self-pay | Admitting: Adult Health

## 2016-12-17 ENCOUNTER — Other Ambulatory Visit (HOSPITAL_COMMUNITY)
Admission: RE | Admit: 2016-12-17 | Discharge: 2016-12-17 | Disposition: A | Discharge status: Home / Self Care | Payer: BLUE CROSS/BLUE SHIELD | Source: Ambulatory Visit | Attending: Adult Health | Admitting: Adult Health

## 2016-12-17 ENCOUNTER — Ambulatory Visit (INDEPENDENT_AMBULATORY_CARE_PROVIDER_SITE_OTHER): Payer: BLUE CROSS/BLUE SHIELD | Admitting: Adult Health

## 2016-12-17 VITALS — BP 110/70 | HR 78 | Ht 66.0 in | Wt 266.5 lb

## 2016-12-17 DIAGNOSIS — R1031 Right lower quadrant pain: Secondary | ICD-10-CM

## 2016-12-17 DIAGNOSIS — Z30011 Encounter for initial prescription of contraceptive pills: Secondary | ICD-10-CM

## 2016-12-17 DIAGNOSIS — Z3009 Encounter for other general counseling and advice on contraception: Secondary | ICD-10-CM

## 2016-12-17 DIAGNOSIS — Z113 Encounter for screening for infections with a predominantly sexual mode of transmission: Secondary | ICD-10-CM | POA: Insufficient documentation

## 2016-12-17 DIAGNOSIS — Z01411 Encounter for gynecological examination (general) (routine) with abnormal findings: Secondary | ICD-10-CM | POA: Diagnosis not present

## 2016-12-17 DIAGNOSIS — N926 Irregular menstruation, unspecified: Secondary | ICD-10-CM

## 2016-12-17 DIAGNOSIS — Z308 Encounter for other contraceptive management: Secondary | ICD-10-CM | POA: Diagnosis not present

## 2016-12-17 DIAGNOSIS — Z3202 Encounter for pregnancy test, result negative: Secondary | ICD-10-CM | POA: Diagnosis not present

## 2016-12-17 DIAGNOSIS — Z01419 Encounter for gynecological examination (general) (routine) without abnormal findings: Secondary | ICD-10-CM

## 2016-12-17 LAB — POCT URINALYSIS DIPSTICK
Glucose, UA: NEGATIVE
Leukocytes, UA: NEGATIVE
Nitrite, UA: NEGATIVE
PROTEIN UA: NEGATIVE

## 2016-12-17 LAB — POCT URINE PREGNANCY: Preg Test, Ur: NEGATIVE

## 2016-12-17 MED ORDER — NORETHIN-ETH ESTRAD-FE BIPHAS 1 MG-10 MCG / 10 MCG PO TABS: 1.0000 | ORAL_TABLET | Freq: Every day | ORAL | 11 refills | Status: DC

## 2016-12-17 NOTE — Progress Notes (Signed)
Patient ID: Jaime Ramos, female   DOB: 06-13-1996, 21 y.o.   MRN: [id number] History of Present Illness: Jaime Ramos is a 21 year old black female in for a well woman gyn exam and pap, she has family planning medicaid.She has missed a period and has pain/pressure RLQ at times.    Current Medications, Allergies, Past Medical History, Past Surgical History, Family History and Social History were reviewed in [facility] electronic medical record.     Review of Systems: Patient denies any daily headaches(may have 2-3 a week, and advil or aleve relieves them, told to see Eye doctor, has glasses), hearing loss, fatigue, blurred vision, shortness of breath, chest pain, problems with bowel movements, urination, or intercourse. No joint pain or mood swings. Has pain/ pressure RLQ and has missed a period, needs birth control.   Physical Exam:BP 110/70 (BP Location: Left Arm, Patient Position: Sitting, Cuff Size: Large)   Pulse 78   Ht 5\' 6"  (1.676 m)   Wt 266 lb 8 oz (120.9 kg)   LMP 11/08/2016   BMI 43.01 kg/m UPT negative, urine dipstick trace blood. General:  Well developed, well nourished, no acute distress Skin:  Warm and dry Neck:  Midline trachea, normal thyroid, good ROM, no lymphadenopathy Lungs; Clear to auscultation bilaterally Breast:  No dominant palpable mass, retraction, or nipple discharge Cardiovascular: Regular rate and rhythm Abdomen:  Soft, non tender, no hepatosplenomegaly Pelvic:  External genitalia is normal in appearance, no lesions.  The vagina is normal in appearance. Urethra has no lesions or masses. The cervix is smooth, pap with GC/CHL performed.  Uterus is felt to be normal size, shape, and contour.  No adnexal masses, RLQ tenderness noted.Bladder is non tender, no masses felt Extremities/musculoskeletal:  No swelling or varicosities noted, no clubbing or cyanosis Psych:  No mood changes, alert and cooperative,seems happy PHQ 2 score 0.Will try low dose pill, and  will get US pt assess RLQ pain.  Impression:  1. Encounter for gynecological examination with Papanicolaou smear of cervix   2. Family planning   3. RLQ abdominal pain   4. Missed period   5. Encounter for initial prescription of contraceptive pills      Plan: Check HIV and RPR Meds ordered this encounter  Medications  . Norethindrone-Ethinyl Estradiol-Fe Biphas (LO LOESTRIN FE) 1 MG-10 MCG / 10 MCG tablet    Sig: Take 1 tablet by mouth daily. Take 1 daily by mouth    Dispense:  1 Package    Refill:  11    BIN [id number], PCN [id number], GRP [id number],ID [id number]    Order Specific Question:   Supervising Provider    Answer:   Lazaro ArmsEURE, LUTHER H [[id number]]  Return in 1 week for GYN US Physical in 1 year Use condoms

## 2016-12-17 NOTE — Patient Instructions (Signed)
Start OCs with next period Use condoms Return in 1 week for US Physical in 1 year

## 2016-12-18 LAB — HIV ANTIBODY (ROUTINE TESTING W REFLEX): HIV SCREEN 4TH GENERATION: NONREACTIVE

## 2016-12-18 LAB — RPR: RPR Ser Ql: NONREACTIVE

## 2016-12-20 ENCOUNTER — Encounter: Payer: Self-pay | Admitting: Adult Health

## 2016-12-20 ENCOUNTER — Telehealth: Payer: Self-pay | Admitting: Adult Health

## 2016-12-20 DIAGNOSIS — R87619 Unspecified abnormal cytological findings in specimens from cervix uteri: Secondary | ICD-10-CM

## 2016-12-20 DIAGNOSIS — R87613 High grade squamous intraepithelial lesion on cytologic smear of cervix (HGSIL): Secondary | ICD-10-CM

## 2016-12-20 HISTORY — DX: Unspecified abnormal cytological findings in specimens from cervix uteri: R87.619

## 2016-12-20 LAB — CYTOLOGY - PAP
CHLAMYDIA, DNA PROBE: NEGATIVE
DIAGNOSIS: HIGH — AB
NEISSERIA GONORRHEA: NEGATIVE

## 2016-12-20 NOTE — Telephone Encounter (Signed)
Pt aware pap abnormal, appt made for colpo with Dr Despina HiddenEure 3/5 at 10:30 am

## 2016-12-24 ENCOUNTER — Ambulatory Visit (INDEPENDENT_AMBULATORY_CARE_PROVIDER_SITE_OTHER): Payer: BLUE CROSS/BLUE SHIELD

## 2016-12-24 DIAGNOSIS — N854 Malposition of uterus: Secondary | ICD-10-CM

## 2016-12-24 DIAGNOSIS — R1031 Right lower quadrant pain: Secondary | ICD-10-CM | POA: Diagnosis not present

## 2016-12-24 NOTE — Progress Notes (Signed)
PELVIC US TA/TV:homogeneous retroverted uterus,wnl,EEC 9.4 mm,normal ov's bilat,small amount of simple cul de sac fluid,ovaries appear mobile,no pain during ultrasound

## 2017-01-07 ENCOUNTER — Ambulatory Visit (INDEPENDENT_AMBULATORY_CARE_PROVIDER_SITE_OTHER): Payer: BLUE CROSS/BLUE SHIELD | Admitting: Obstetrics & Gynecology

## 2017-01-07 ENCOUNTER — Encounter: Payer: Self-pay | Admitting: Obstetrics & Gynecology

## 2017-01-07 VITALS — BP 100/60 | HR 86 | Ht 66.0 in | Wt 265.0 lb

## 2017-01-07 DIAGNOSIS — Z3202 Encounter for pregnancy test, result negative: Secondary | ICD-10-CM

## 2017-01-07 DIAGNOSIS — R87613 High grade squamous intraepithelial lesion on cytologic smear of cervix (HGSIL): Secondary | ICD-10-CM | POA: Diagnosis not present

## 2017-01-07 DIAGNOSIS — N871 Moderate cervical dysplasia: Secondary | ICD-10-CM

## 2017-01-07 DIAGNOSIS — D060 Carcinoma in situ of endocervix: Secondary | ICD-10-CM

## 2017-01-07 LAB — POCT URINE PREGNANCY: Preg Test, Ur: NEGATIVE

## 2017-01-07 NOTE — Progress Notes (Signed)
Colposcopy Procedure Note:  Colposcopy Procedure Note  Indications: Pap smear 1 months ago showed: high-grade squamous intraepithelial neoplasia  (HGSIL-encompassing moderate and severe dysplasia). The prior pap showed this is her first Pap.  Prior cervical/vaginal disease: n/a. Prior cervical treatment: n/a.  Smoker:  No. New sexual partner:  No.  : time frame:    History of abnormal Pap: n/a  Procedure Details  The risks and benefits of the procedure and Verbal informed consent obtained.  Speculum placed in vagina and excellent visualization of cervix achieved, cervix swabbed x 3 with acetic acid solution.  Findings: Cervix: no visible lesions, no mosaicism, no punctation and no abnormal vasculature; SCJ visualized 360 degrees without lesions and no biopsies taken. Vaginal inspection: normal without visible lesions. Vulvar colposcopy: vulvar colposcopy not performed.  Specimens: none  Complications: none.  Plan: This is her first Pap, colposcopy was completely normal, ordinarily with a HSIL pap would repeat 6 months but with her age and first Pap will re evaluate with cytology in 1 year

## 2017-03-08 ENCOUNTER — Encounter (HOSPITAL_COMMUNITY): Payer: Self-pay | Admitting: *Deleted

## 2017-03-08 ENCOUNTER — Inpatient Hospital Stay (HOSPITAL_COMMUNITY)
Admission: AD | Admit: 2017-03-08 | Discharge: 2017-03-09 | Disposition: A | Discharge status: Home / Self Care | Payer: BLUE CROSS/BLUE SHIELD | Source: Ambulatory Visit | Attending: Obstetrics and Gynecology | Admitting: Obstetrics and Gynecology

## 2017-03-08 DIAGNOSIS — E119 Type 2 diabetes mellitus without complications: Secondary | ICD-10-CM | POA: Diagnosis not present

## 2017-03-08 DIAGNOSIS — K5900 Constipation, unspecified: Secondary | ICD-10-CM

## 2017-03-08 DIAGNOSIS — R102 Pelvic and perineal pain: Secondary | ICD-10-CM | POA: Insufficient documentation

## 2017-03-08 DIAGNOSIS — R1032 Left lower quadrant pain: Secondary | ICD-10-CM | POA: Diagnosis present

## 2017-03-08 DIAGNOSIS — N760 Acute vaginitis: Secondary | ICD-10-CM | POA: Diagnosis not present

## 2017-03-08 DIAGNOSIS — Z9889 Other specified postprocedural states: Secondary | ICD-10-CM | POA: Diagnosis not present

## 2017-03-08 DIAGNOSIS — N941 Unspecified dyspareunia: Secondary | ICD-10-CM | POA: Diagnosis not present

## 2017-03-08 DIAGNOSIS — F329 Major depressive disorder, single episode, unspecified: Secondary | ICD-10-CM | POA: Diagnosis not present

## 2017-03-08 DIAGNOSIS — F419 Anxiety disorder, unspecified: Secondary | ICD-10-CM | POA: Insufficient documentation

## 2017-03-08 DIAGNOSIS — J45909 Unspecified asthma, uncomplicated: Secondary | ICD-10-CM | POA: Insufficient documentation

## 2017-03-08 DIAGNOSIS — B9689 Other specified bacterial agents as the cause of diseases classified elsewhere: Secondary | ICD-10-CM

## 2017-03-08 LAB — URINALYSIS, ROUTINE W REFLEX MICROSCOPIC
Bilirubin Urine: NEGATIVE
Glucose, UA: NEGATIVE mg/dL
HGB URINE DIPSTICK: NEGATIVE
Ketones, ur: NEGATIVE mg/dL
Leukocytes, UA: NEGATIVE
Nitrite: NEGATIVE
Protein, ur: NEGATIVE mg/dL
SPECIFIC GRAVITY, URINE: 1.017 (ref 1.005–1.030)
pH: 8 (ref 5.0–8.0)

## 2017-03-08 LAB — POCT PREGNANCY, URINE: PREG TEST UR: NEGATIVE

## 2017-03-08 NOTE — MAU Note (Signed)
Having pain in lower abd  For 2-3 days. Occ sharp pain in L upper quad. Pain in lower abd worse when has intercourse. Denies vag bleeding or d/c.

## 2017-03-09 ENCOUNTER — Inpatient Hospital Stay (HOSPITAL_COMMUNITY): Payer: BLUE CROSS/BLUE SHIELD

## 2017-03-09 LAB — HIV ANTIBODY (ROUTINE TESTING W REFLEX): HIV Screen 4th Generation wRfx: NONREACTIVE

## 2017-03-09 LAB — CBC
HCT: 32.8 % — ABNORMAL LOW (ref 36.0–46.0)
Hemoglobin: 10 g/dL — ABNORMAL LOW (ref 12.0–15.0)
MCH: 23.1 pg — AB (ref 26.0–34.0)
MCHC: 30.5 g/dL (ref 30.0–36.0)
MCV: 75.8 fL — AB (ref 78.0–100.0)
PLATELETS: 377 10*3/uL (ref 150–400)
RBC: 4.33 MIL/uL (ref 3.87–5.11)
RDW: 16.7 % — AB (ref 11.5–15.5)
WBC: 11.3 10*3/uL — ABNORMAL HIGH (ref 4.0–10.5)

## 2017-03-09 LAB — RPR: RPR Ser Ql: NONREACTIVE

## 2017-03-09 LAB — WET PREP, GENITAL
Sperm: NONE SEEN
TRICH WET PREP: NONE SEEN
YEAST WET PREP: NONE SEEN

## 2017-03-09 MED ORDER — POLYETHYLENE GLYCOL 3350 17 GM/SCOOP PO POWD
17.0000 g | Freq: Every day | ORAL | 0 refills | Status: DC
Start: 2017-03-09 — End: 2018-04-02

## 2017-03-09 MED ORDER — METRONIDAZOLE 500 MG PO TABS: 500.0000 mg | ORAL_TABLET | Freq: Two times a day (BID) | ORAL | 0 refills | Status: AC

## 2017-03-09 MED ORDER — DOCUSATE SODIUM 100 MG PO CAPS: 100.0000 mg | ORAL_CAPSULE | Freq: Two times a day (BID) | ORAL | 2 refills | Status: DC | PRN

## 2017-03-09 NOTE — MAU Provider Note (Signed)
Chief Complaint: Abdominal Pain   First Provider Initiated Contact with Patient 03/09/17 0001      SUBJECTIVE HPI: Jaime Ramos is a 21 y.o. G2P0110 who presents to maternity admissions reporting pain in her left lower abdomen x 3 days. Her pain is constant sharp pain that is worse with intercourse.  She reports some nausea without vomiting but denies other associated symptoms. Nothing makes the pain better, intercourse makes it worse.  Patient's last menstrual period was 02/16/2017.  She has regular periods.  She reports recent constipation with last bowel movement today but it was small with previous bowel movement 3-4 days ago. She denies vaginal bleeding, vaginal itching/burning, urinary symptoms, h/a, dizziness, vomiting, or fever/chills.     HPI  Past Medical History:  Diagnosis Date  . 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 Anxiety   . Asthma   . Chlamydia   . Depression    doing good  . Diabetes mellitus without complication (HCC)   . Obesity   . Precocious puberty   . Prediabetes   . Trichomonal vaginitis    Past Surgical History:  Procedure Laterality Date  . DILATION AND CURETTAGE OF UTERUS N/A 11/09/2014   Procedure: DILATATION AND CURETTAGE;  Surgeon: Allie Bossier, MD;  Location: [facility];  Service: Gynecology;  Laterality: N/A;  . DILATION AND CURETTAGE OF UTERUS  2016   Social History   Social History  . Marital status: Single    Spouse name: N/A  . Number of children: N/A  . Years of education: N/A   Occupational History  . Not on file.   Social History Main Topics  . Smoking status: Never Smoker  . Smokeless tobacco: Never Used  . Alcohol use No  . Drug use: No  . Sexual activity: Yes    Birth control/ protection: None, Condom   Other Topics Concern  . Not on file   Social History Narrative   Lives with mom and step father. Rare visits with biologic dad. 11 th grade [employer] Wants to play softball this year.     No current facility-administered medications on file prior to encounter.    Current Outpatient Prescriptions on File Prior to Encounter  Medication Sig Dispense Refill  . Norethindrone-Ethinyl Estradiol-Fe Biphas (LO LOESTRIN FE) 1 MG-10 MCG / 10 MCG tablet Take 1 tablet by mouth daily. Take 1 daily by mouth 1 Package 11  . Pumpkin Seed-Soy Germ (AZO BLADDER CONTROL/GO-LESS PO) Take by mouth as needed.     . [DISCONTINUED] norgestimate-ethinyl estradiol (ORTHO-CYCLEN,SPRINTEC,PREVIFEM) 0.25-35 MG-MCG tablet Take 1 tablet by mouth daily. (Patient not taking: Reported on 06/29/2015) 1 Package 11   No Known Allergies  ROS:  Review of Systems  Constitutional: Negative for chills, fatigue and fever.  Respiratory: Negative for shortness of breath.   Cardiovascular: Negative for chest pain.  Gastrointestinal: Positive for constipation and nausea. Negative for vomiting.  Genitourinary: Positive for pelvic pain. Negative for difficulty urinating, dysuria, flank pain, vaginal bleeding, vaginal discharge and vaginal pain.  Neurological: Negative for dizziness and headaches.  Psychiatric/Behavioral: Negative.      I have reviewed patient's Past Medical Hx, Surgical Hx, Family Hx, Social Hx, medications and allergies.   Physical Exam   Patient Vitals for the past 24 hrs:  BP Temp Pulse Resp Height Weight  03/09/17 0241 118/78 - 88 18 - -  03/08/17 2142 115/71 98.1 F (36.7 C) 98 17 5\' 6"  (1.676 m) 269  lb (122 kg)   Constitutional: Well-developed, well-nourished female in no acute distress.  Cardiovascular: normal rate Respiratory: normal effort GI: Abd soft, non-tender. Pos BS x 4 MS: Extremities nontender, no edema, normal ROM Neurologic: Alert and oriented x 4.  GU: Neg CVAT.  PELVIC EXAM: Cervix pink, visually closed, without lesion, scant white creamy discharge, vaginal walls and external genitalia normal Bimanual exam: Cervix 0/long/high, firm, anterior, neg CMT, uterus  nontender, nonenlarged, left adnexal tenderness, none on right, no enlargement or mass bilaterally   LAB RESULTS Results for orders placed or performed during the hospital encounter of 03/08/17 (from the past 24 hour(s))  Urinalysis, Routine w reflex microscopic     Status: Abnormal   Collection Time: 03/08/17  9:50 PM  Result Value Ref Range   Color, Urine YELLOW YELLOW   APPearance CLOUDY (A) CLEAR   Specific Gravity, Urine 1.017 1.005 - 1.030   pH 8.0 5.0 - 8.0   Glucose, UA NEGATIVE NEGATIVE mg/dL   Hgb urine dipstick NEGATIVE NEGATIVE   Bilirubin Urine NEGATIVE NEGATIVE   Ketones, ur NEGATIVE NEGATIVE mg/dL   Protein, ur NEGATIVE NEGATIVE mg/dL   Nitrite NEGATIVE NEGATIVE   Leukocytes, UA NEGATIVE NEGATIVE  Pregnancy, urine POC     Status: None   Collection Time: 03/08/17 10:15 PM  Result Value Ref Range   Preg Test, Ur NEGATIVE NEGATIVE  Wet prep, genital     Status: Abnormal   Collection Time: 03/09/17 12:10 AM  Result Value Ref Range   Yeast Wet Prep HPF POC NONE SEEN NONE SEEN   Trich, Wet Prep NONE SEEN NONE SEEN   Clue Cells Wet Prep HPF POC PRESENT (A) NONE SEEN   WBC, Wet Prep HPF POC FEW (A) NONE SEEN   Sperm NONE SEEN   CBC     Status: Abnormal   Collection Time: 03/09/17 12:25 AM  Result Value Ref Range   WBC 11.3 (H) 4.0 - 10.5 K/uL   RBC 4.33 3.87 - 5.11 MIL/uL   Hemoglobin 10.0 (L) 12.0 - 15.0 g/dL   HCT 16.1 (L) 09.6 - 04.5 %   MCV 75.8 (L) 78.0 - 100.0 fL   MCH 23.1 (L) 26.0 - 34.0 pg   MCHC 30.5 30.0 - 36.0 g/dL   RDW 40.9 (H) 81.1 - 91.4 %   Platelets 377 150 - 400 K/uL       IMAGING US Transvaginal Non-ob  Result Date: 03/09/2017 CLINICAL DATA:  Constant LEFT lower quadrant pain for 3 days. EXAM: TRANSABDOMINAL AND TRANSVAGINAL ULTRASOUND OF PELVIS DOPPLER ULTRASOUND OF OVARIES TECHNIQUE: Both transabdominal and transvaginal ultrasound examinations of the pelvis were performed. Transabdominal technique was performed for global imaging of the  pelvis including uterus, ovaries, adnexal regions, and pelvic cul-de-sac. It was necessary to proceed with endovaginal exam following the transabdominal exam to visualize the endometrium and adnexum. Color and duplex Doppler ultrasound was utilized to evaluate blood flow to the ovaries. COMPARISON:  Pelvic ultrasound December 24, 2016 FINDINGS: Uterus Measurements: 9.4 x 5 x 5.7 cm. No fibroids or other mass visualized. Small nabothian cysts at the level of the cervix. Endometrium Thickness: 13 mm.  No focal abnormality visualized. Right ovary Measurements: 4.4 x 2.5 x 2.7 cm. Normal appearance/no adnexal mass. Left ovary Measurements: 2.7 x 2.7 x 2.6 cm. Normal appearance/no adnexal mass. Pulsed Doppler evaluation of both ovaries demonstrates normal low-resistance arterial and venous waveforms. Other findings Trace free fluid in the pelvis is likely physiologic. IMPRESSION: Normal pelvic ultrasound. Electronically Signed  By: Awilda Metro M.D.   On: 03/09/2017 02:04   US Pelvis Complete  Result Date: 03/09/2017 CLINICAL DATA:  Constant LEFT lower quadrant pain for 3 days. EXAM: TRANSABDOMINAL AND TRANSVAGINAL ULTRASOUND OF PELVIS DOPPLER ULTRASOUND OF OVARIES TECHNIQUE: Both transabdominal and transvaginal ultrasound examinations of the pelvis were performed. Transabdominal technique was performed for global imaging of the pelvis including uterus, ovaries, adnexal regions, and pelvic cul-de-sac. It was necessary to proceed with endovaginal exam following the transabdominal exam to visualize the endometrium and adnexum. Color and duplex Doppler ultrasound was utilized to evaluate blood flow to the ovaries. COMPARISON:  Pelvic ultrasound December 24, 2016 FINDINGS: Uterus Measurements: 9.4 x 5 x 5.7 cm. No fibroids or other mass visualized. Small nabothian cysts at the level of the cervix. Endometrium Thickness: 13 mm.  No focal abnormality visualized. Right ovary Measurements: 4.4 x 2.5 x 2.7 cm. Normal  appearance/no adnexal mass. Left ovary Measurements: 2.7 x 2.7 x 2.6 cm. Normal appearance/no adnexal mass. Pulsed Doppler evaluation of both ovaries demonstrates normal low-resistance arterial and venous waveforms. Other findings Trace free fluid in the pelvis is likely physiologic. IMPRESSION: Normal pelvic ultrasound. Electronically Signed   By: Awilda Metro M.D.   On: 03/09/2017 02:04   [place] Art/ven Flow Abd Pelv Doppler  Result Date: 03/09/2017 CLINICAL DATA:  Constant LEFT lower quadrant pain for 3 days. EXAM: TRANSABDOMINAL AND TRANSVAGINAL ULTRASOUND OF PELVIS DOPPLER ULTRASOUND OF OVARIES TECHNIQUE: Both transabdominal and transvaginal ultrasound examinations of the pelvis were performed. Transabdominal technique was performed for global imaging of the pelvis including uterus, ovaries, adnexal regions, and pelvic cul-de-sac. It was necessary to proceed with endovaginal exam following the transabdominal exam to visualize the endometrium and adnexum. Color and duplex Doppler ultrasound was utilized to evaluate blood flow to the ovaries. COMPARISON:  Pelvic ultrasound December 24, 2016 FINDINGS: Uterus Measurements: 9.4 x 5 x 5.7 cm. No fibroids or other mass visualized. Small nabothian cysts at the level of the cervix. Endometrium Thickness: 13 mm.  No focal abnormality visualized. Right ovary Measurements: 4.4 x 2.5 x 2.7 cm. Normal appearance/no adnexal mass. Left ovary Measurements: 2.7 x 2.7 x 2.6 cm. Normal appearance/no adnexal mass. Pulsed Doppler evaluation of both ovaries demonstrates normal low-resistance arterial and venous waveforms. Other findings Trace free fluid in the pelvis is likely physiologic. IMPRESSION: Normal pelvic ultrasound. Electronically Signed   By: Awilda Metro M.D.   On: 03/09/2017 02:04    MAU Management/MDM: Ordered UA, CBC, wet prep and GC and reviewed results.  GC pending.  Significant adnexal pain on exam so pelvic US ordered in MAU with results wnl, no  evidence of torsion with normal pelvic dopplers.  Wet prep indicates BV and pt reports recent constipation so will treat for both. Flagyl 500 mg BID x 7 days, Colace 100 mg BID, and Miralax daily.  Increase PO fluids and add fiber to diet.  F/U with primary care if symptoms persist.   Pt stable at time of discharge.  ASSESSMENT 1. Constipation, unspecified constipation type   2. LLQ pain   3. Adnexal pain   4. Acute pelvic pain, female   5. Dyspareunia in female   6. BV (bacterial vaginosis)     PLAN Discharge home Allergies as of 03/09/2017   No Known Allergies     Medication List    TAKE these medications   AZO BLADDER CONTROL/GO-LESS PO Take by mouth as needed.   docusate sodium 100 MG capsule Commonly known as:  COLACE Take  1 capsule (100 mg total) by mouth 2 (two) times daily as needed.   metroNIDAZOLE 500 MG tablet Commonly known as:  FLAGYL Take 1 tablet (500 mg total) by mouth 2 (two) times daily.   Norethindrone-Ethinyl Estradiol-Fe Biphas 1 MG-10 MCG / 10 MCG tablet Commonly known as:  LO LOESTRIN FE Take 1 tablet by mouth daily. Take 1 daily by mouth   polyethylene glycol powder powder Commonly known as:  GLYCOLAX/MIRALAX Take 17 g by mouth daily.      Follow-up Information    With primary care if symptoms persist Follow up.   Why:  Return to MAU as needed for Gyn emergencies          Sharen CounterLisa Leftwich-Kirby Certified Nurse-Midwife 03/09/2017  4:01 AM

## 2017-03-11 LAB — GC/CHLAMYDIA PROBE AMP (~~LOC~~) NOT AT ARMC
Chlamydia: NEGATIVE
NEISSERIA GONORRHEA: NEGATIVE

## 2017-05-09 ENCOUNTER — Emergency Department (HOSPITAL_COMMUNITY)
Admission: EM | Admit: 2017-05-09 | Discharge: 2017-05-10 | Disposition: A | Discharge status: Home / Self Care | Payer: BLUE CROSS/BLUE SHIELD | Attending: Emergency Medicine | Admitting: Emergency Medicine

## 2017-05-09 ENCOUNTER — Encounter (HOSPITAL_COMMUNITY): Payer: Self-pay

## 2017-05-09 ENCOUNTER — Emergency Department (HOSPITAL_COMMUNITY): Payer: BLUE CROSS/BLUE SHIELD

## 2017-05-09 DIAGNOSIS — R51 Headache: Secondary | ICD-10-CM | POA: Diagnosis not present

## 2017-05-09 DIAGNOSIS — E119 Type 2 diabetes mellitus without complications: Secondary | ICD-10-CM | POA: Insufficient documentation

## 2017-05-09 DIAGNOSIS — Y939 Activity, unspecified: Secondary | ICD-10-CM | POA: Diagnosis not present

## 2017-05-09 DIAGNOSIS — Y999 Unspecified external cause status: Secondary | ICD-10-CM | POA: Diagnosis not present

## 2017-05-09 DIAGNOSIS — Z79899 Other long term (current) drug therapy: Secondary | ICD-10-CM | POA: Insufficient documentation

## 2017-05-09 DIAGNOSIS — J45909 Unspecified asthma, uncomplicated: Secondary | ICD-10-CM | POA: Insufficient documentation

## 2017-05-09 DIAGNOSIS — S46911A Strain of unspecified muscle, fascia and tendon at shoulder and upper arm level, right arm, initial encounter: Secondary | ICD-10-CM

## 2017-05-09 DIAGNOSIS — Y92410 Unspecified street and highway as the place of occurrence of the external cause: Secondary | ICD-10-CM | POA: Insufficient documentation

## 2017-05-09 DIAGNOSIS — S20211A Contusion of right front wall of thorax, initial encounter: Secondary | ICD-10-CM | POA: Insufficient documentation

## 2017-05-09 DIAGNOSIS — S40911A Unspecified superficial injury of right shoulder, initial encounter: Secondary | ICD-10-CM | POA: Diagnosis present

## 2017-05-09 LAB — URINALYSIS, ROUTINE W REFLEX MICROSCOPIC
BACTERIA UA: NONE SEEN
BILIRUBIN URINE: NEGATIVE
GLUCOSE, UA: NEGATIVE mg/dL
HGB URINE DIPSTICK: NEGATIVE
KETONES UR: NEGATIVE mg/dL
LEUKOCYTES UA: NEGATIVE
NITRITE: NEGATIVE
PROTEIN: 100 mg/dL — AB
Specific Gravity, Urine: 1.024 (ref 1.005–1.030)
pH: 8 (ref 5.0–8.0)

## 2017-05-09 MED ORDER — ACETAMINOPHEN 325 MG PO TABS
650.0000 mg | ORAL_TABLET | Freq: Once | ORAL | Status: AC | PRN
  Administered 2017-05-09: 650 mg via ORAL
  Filled 2017-05-09: qty 2
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
## 2017-05-10 LAB — PREGNANCY, URINE: Preg Test, Ur: NEGATIVE

## 2017-05-10 NOTE — Discharge Instructions (Signed)
Ibuprofen 600 mg every six hours as needed for pain.  Follow up with your primary doctor if not improving in the next 2-3 days.

## 2017-06-02 DIAGNOSIS — R102 Pelvic and perineal pain: Secondary | ICD-10-CM | POA: Insufficient documentation

## 2017-06-02 DIAGNOSIS — J45909 Unspecified asthma, uncomplicated: Secondary | ICD-10-CM | POA: Insufficient documentation

## 2017-06-02 DIAGNOSIS — R3915 Urgency of urination: Secondary | ICD-10-CM | POA: Insufficient documentation

## 2017-06-02 DIAGNOSIS — N760 Acute vaginitis: Secondary | ICD-10-CM | POA: Insufficient documentation

## 2017-06-02 DIAGNOSIS — B9689 Other specified bacterial agents as the cause of diseases classified elsewhere: Secondary | ICD-10-CM | POA: Insufficient documentation

## 2017-06-02 DIAGNOSIS — N898 Other specified noninflammatory disorders of vagina: Secondary | ICD-10-CM | POA: Diagnosis present

## 2017-06-02 DIAGNOSIS — N739 Female pelvic inflammatory disease, unspecified: Secondary | ICD-10-CM | POA: Insufficient documentation

## 2017-06-02 DIAGNOSIS — E119 Type 2 diabetes mellitus without complications: Secondary | ICD-10-CM | POA: Diagnosis not present

## 2017-06-03 ENCOUNTER — Emergency Department (HOSPITAL_COMMUNITY)
Admission: EM | Admit: 2017-06-03 | Discharge: 2017-06-03 | Disposition: A | Discharge status: Home / Self Care | Payer: BLUE CROSS/BLUE SHIELD | Attending: Emergency Medicine | Admitting: Emergency Medicine

## 2017-06-03 ENCOUNTER — Encounter (HOSPITAL_COMMUNITY): Payer: Self-pay | Admitting: Emergency Medicine

## 2017-06-03 ENCOUNTER — Emergency Department (HOSPITAL_COMMUNITY): Payer: BLUE CROSS/BLUE SHIELD

## 2017-06-03 DIAGNOSIS — B9689 Other specified bacterial agents as the cause of diseases classified elsewhere: Secondary | ICD-10-CM

## 2017-06-03 DIAGNOSIS — N760 Acute vaginitis: Secondary | ICD-10-CM

## 2017-06-03 DIAGNOSIS — N739 Female pelvic inflammatory disease, unspecified: Secondary | ICD-10-CM

## 2017-06-03 LAB — COMPREHENSIVE METABOLIC PANEL
ALBUMIN: 3.3 g/dL — AB (ref 3.5–5.0)
ALT: 16 U/L (ref 14–54)
ANION GAP: 5 (ref 5–15)
AST: 19 U/L (ref 15–41)
Alkaline Phosphatase: 78 U/L (ref 38–126)
BILIRUBIN TOTAL: 0.3 mg/dL (ref 0.3–1.2)
BUN: 7 mg/dL (ref 6–20)
CO2: 26 mmol/L (ref 22–32)
Calcium: 8.6 mg/dL — ABNORMAL LOW (ref 8.9–10.3)
Chloride: 109 mmol/L (ref 101–111)
Creatinine, Ser: 0.61 mg/dL (ref 0.44–1.00)
GFR calc non Af Amer: >60 mL/min (ref >60)
GLUCOSE: 95 mg/dL (ref 65–99)
POTASSIUM: 3.7 mmol/L (ref 3.5–5.1)
Sodium: 140 mmol/L (ref 135–145)
TOTAL PROTEIN: 6.8 g/dL (ref 6.5–8.1)

## 2017-06-03 LAB — URINALYSIS, ROUTINE W REFLEX MICROSCOPIC
Bilirubin Urine: NEGATIVE
GLUCOSE, UA: NEGATIVE mg/dL
Hgb urine dipstick: NEGATIVE
Ketones, ur: NEGATIVE mg/dL
Leukocytes, UA: NEGATIVE
NITRITE: NEGATIVE
PROTEIN: 30 mg/dL — AB
Specific Gravity, Urine: 1.025 (ref 1.005–1.030)
pH: 6 (ref 5.0–8.0)

## 2017-06-03 LAB — CBC WITH DIFFERENTIAL/PLATELET
BASOS PCT: 0 %
Basophils Absolute: 0 10*3/uL (ref 0.0–0.1)
EOS ABS: 0.2 10*3/uL (ref 0.0–0.7)
Eosinophils Relative: 2 %
HEMATOCRIT: 34.8 % — AB (ref 36.0–46.0)
Hemoglobin: 10.4 g/dL — ABNORMAL LOW (ref 12.0–15.0)
Lymphocytes Relative: 32 %
Lymphs Abs: 3 10*3/uL (ref 0.7–4.0)
MCH: 22.5 pg — ABNORMAL LOW (ref 26.0–34.0)
MCHC: 29.9 g/dL — AB (ref 30.0–36.0)
MCV: 75.2 fL — ABNORMAL LOW (ref 78.0–100.0)
MONO ABS: 0.5 10*3/uL (ref 0.1–1.0)
MONOS PCT: 6 %
NEUTROS ABS: 5.8 10*3/uL (ref 1.7–7.7)
Neutrophils Relative %: 60 %
Platelets: 420 10*3/uL — ABNORMAL HIGH (ref 150–400)
RBC: 4.63 MIL/uL (ref 3.87–5.11)
RDW: 16.6 % — AB (ref 11.5–15.5)
WBC: 9.6 10*3/uL (ref 4.0–10.5)

## 2017-06-03 LAB — WET PREP, GENITAL
Sperm: NONE SEEN
TRICH WET PREP: NONE SEEN
YEAST WET PREP: NONE SEEN

## 2017-06-03 LAB — POC URINE PREG, ED: PREG TEST UR: NEGATIVE

## 2017-06-03 LAB — GC/CHLAMYDIA PROBE AMP (~~LOC~~) NOT AT ARMC
Chlamydia: NEGATIVE
Neisseria Gonorrhea: NEGATIVE

## 2017-06-03 MED ORDER — STERILE WATER FOR INJECTION IJ SOLN
INTRAMUSCULAR | Status: AC
  Administered 2017-06-03: 10 mL
  Filled 2017-06-03: qty 10

## 2017-06-03 MED ORDER — METRONIDAZOLE 500 MG PO TABS: 500.0000 mg | ORAL_TABLET | Freq: Two times a day (BID) | ORAL | 0 refills | Status: DC

## 2017-06-03 MED ORDER — CEFTRIAXONE SODIUM 250 MG IJ SOLR
250.0000 mg | Freq: Once | INTRAMUSCULAR | Status: AC
  Administered 2017-06-03: 250 mg via INTRAMUSCULAR
  Filled 2017-06-03: qty 250

## 2017-06-03 MED ORDER — AZITHROMYCIN 250 MG PO TABS
1000.0000 mg | ORAL_TABLET | Freq: Once | ORAL | Status: AC
  Administered 2017-06-03: 1000 mg via ORAL
  Filled 2017-06-03: qty 4

## 2017-06-03 MED ORDER — DOXYCYCLINE HYCLATE 100 MG PO CAPS: 100.0000 mg | ORAL_CAPSULE | Freq: Two times a day (BID) | ORAL | 0 refills | Status: DC

## 2017-06-03 NOTE — ED Triage Notes (Signed)
Patient arrives with complaint of pelvic pain, urinary urgency, and vaginal discharge. States known exposure to chlamydia. Onset 2 weeks ago. LMP: 7/16.

## 2017-06-03 NOTE — ED Notes (Signed)
Delay in lab draw,  Pt not in room at this time. 

## 2017-06-03 NOTE — ED Notes (Signed)
Patient transported to CT 
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
## 2017-06-03 NOTE — Discharge Instructions (Signed)
You were treated for both gonorrhea and Chlamydia. Your sexual partner should be treated as well. Follow up with her gynecologist take the antibiotics as prescribe. Return to the ED if you develop new or worsening symptoms.

## 2017-06-03 NOTE — ED Provider Notes (Signed)
[facility]-EMERGENCY DEPT Provider Note   CSN: [id number] Arrival date & time: 06/02/17  2336  By signing my name below, I, Jaime Ramos, attest that this documentation has been prepared under the direction and in the presence of Jaime Ramos, Jaime SeniorStephen, MD. Electronically Signed: Rosana Fretana Ramos, ED Scribe. 06/03/17. 2:11 AM.  History   Chief Complaint Chief Complaint  Patient presents with  . Vaginal Discharge  . Pelvic Pain  . Urinary Urgency   The history is provided by the patient. No language interpreter was used.   HPI Comments: Jaime Ramos is a 21 y.o. female with a PMHx of prediabetes, who presents to the Emergency Department complaining of intermittent, worsening pelvic pain onset 2 weeks ago. Pt reports unprotected sexual contact with a partner that was recently diagnosed Chlamydia. Pt reports associated yellow vaginal discharge, urinary urgency, urinary frequency, and dysuria. No hx of ovarian cyst. Pt has tried AZO with minimal relief. Pt denies fever, vaginal bleeding, vomiting or any other complaints at this time.  Past Medical History:  Diagnosis Date  . 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 Anxiety   . Asthma   . Chlamydia   . Depression    doing good  . Diabetes mellitus without complication (HCC)   . Obesity   . Precocious puberty   . Prediabetes   . Trichomonal vaginitis     Patient Active Problem List   Diagnosis Date Noted  . Abnormal Pap smear of cervix 12/20/2016  . MDD (major depressive disorder), recurrent episode, moderate (HCC) 01/25/2016  . Chlamydia 04/27/2015  . Nexplanon removal 02/09/2015  . Preterm delivery 10/10/2013  . Preterm labor 10/09/2013  . Depression 10/02/2013  . Vaginal bleeding before [redacted] weeks gestation 10/02/2013  . Acanthosis 05/28/2012  . Menorrhagia with regular cycle 07/31/2011  . Goiter 07/31/2011  . Prediabetes   . Obesity 05/17/2011    Past Surgical History:  Procedure Laterality Date  .  DILATION AND CURETTAGE OF UTERUS N/A 11/09/2014   Procedure: DILATATION AND CURETTAGE;  Surgeon: [name], MD;  Location: [facility];  Service: Gynecology;  Laterality: N/A;  . DILATION AND CURETTAGE OF UTERUS  2016    OB History    Gravida Para Term Preterm AB Living   2 1   1 1  0   SAB TAB Ectopic Multiple Live Births   1       1       Home Medications    Prior to Admission medications   Medication Sig Start Date End Date Taking? Authorizing Provider  Cranberry-Vitamin C-Probiotic (AZO CRANBERRY PO) Take 1 tablet by mouth daily.   Yes [provider]  docusate sodium (COLACE) 100 MG capsule Take 1 capsule (100 mg total) by mouth 2 (two) times daily as needed. Patient not taking: Reported on 05/09/2017 03/09/17   [name], [name], CNM  Norethindrone-Ethinyl Estradiol-Fe Biphas (LO LOESTRIN FE) 1 MG-10 MCG / 10 MCG tablet Take 1 tablet by mouth daily. Take 1 daily by mouth Patient not taking: Reported on 05/09/2017 12/17/16   [name], [name], NP  polyethylene glycol powder (GLYCOLAX/MIRALAX) powder Take 17 g by mouth daily. Patient not taking: Reported on 05/09/2017 03/09/17   [name], [name], CNM    Family History Family History  Problem Relation Age of Onset  . Diabetes Mother   . Hypertension Mother   . Obesity Mother   . Fibroids Mother   . Cancer Mother  thyroid  . Obesity Father   . Obesity Maternal Aunt   . Fibroids Maternal Aunt   . Obesity Maternal Uncle   . Obesity Paternal Aunt   . Diabetes Maternal Grandmother   . Obesity Maternal Grandmother   . Hypertension Maternal Grandmother   . Diabetes Maternal Grandfather   . Obesity Maternal Grandfather   . Stroke Maternal Grandfather   . Heart disease Maternal Grandfather   . Hypertension Maternal Grandfather   . Obesity Paternal Grandfather   . Heart disease Paternal Grandfather   . Bipolar disorder Brother   . Bipolar disorder Brother     Social History Social History  Substance Use  Topics  . Smoking status: Never Smoker  . Smokeless tobacco: Never Used  . Alcohol use No     Allergies   Patient has no known allergies.   Review of Systems Review of Systems All other systems reviewed and are negative for acute change except as noted in the HPI.  Physical Exam Updated Vital Signs BP (!) 113/52 (BP Location: Right Arm)   Pulse 71   Temp 98.3 F (36.8 C) (Oral)   Resp 20   LMP 05/20/2017 (Exact Date)   SpO2 99%   Physical Exam  Constitutional: She is oriented to person, place, and time. She appears well-developed and well-nourished. No distress.  Obese.  HENT:  Head: Normocephalic and atraumatic.  Mouth/Throat: Oropharynx is clear and moist. No oropharyngeal exudate.  Eyes: Pupils are equal, round, and reactive to light. Conjunctivae and EOM are normal.  Neck: Normal range of motion. Neck supple.  No meningismus.  Cardiovascular: Normal rate, regular rhythm, normal heart sounds and intact distal pulses.   No murmur heard. Pulmonary/Chest: Effort normal and breath sounds normal. No respiratory distress.  Abdominal: Soft. There is tenderness. There is guarding. There is no rebound.  LLQ pain with voluntary guarding. No CVA tenderness.  Genitourinary:  Genitourinary Comments: Exam performed with chaperone present. Normal extrenal genitalia. Foul smelling clear white discharge. Positive CMT. Left adnexal tenderness.   Musculoskeletal: Normal range of motion. She exhibits no edema or tenderness.  Neurological: She is alert and oriented to person, place, and time. No cranial nerve deficit. She exhibits normal muscle tone. Coordination normal.  No ataxia on finger to nose bilaterally. No pronator drift. 5/5 strength throughout. CN 2-12 intact.Equal grip strength. Sensation intact.   Skin: Skin is warm.  Psychiatric: She has a normal mood and affect. Her behavior is normal.  Nursing note and vitals reviewed.    ED Treatments / Results  DIAGNOSTIC  STUDIES: Oxygen Saturation is 99% on RA, normal by my interpretation.   COORDINATION OF CARE: 2:01 AM-Discussed next steps with pt including pelvic exam. Pt verbalized understanding and is agreeable with the plan.   Labs (all labs ordered are listed, but only abnormal results are displayed) Labs Reviewed  WET PREP, GENITAL - Abnormal; Notable for the following:       Result Value   Clue Cells Wet Prep HPF POC PRESENT (*)    WBC, Wet Prep HPF POC MANY (*)    All other components within normal limits  URINALYSIS, ROUTINE W REFLEX MICROSCOPIC - Abnormal; Notable for the following:    APPearance HAZY (*)    Protein, ur 30 (*)    Bacteria, UA RARE (*)    Squamous Epithelial / LPF 6-30 (*)    All other components within normal limits  CBC WITH DIFFERENTIAL/PLATELET - Abnormal; Notable for the following:    Hemoglobin  10.4 (*)    HCT 34.8 (*)    MCV 75.2 (*)    MCH 22.5 (*)    MCHC 29.9 (*)    RDW 16.6 (*)    Platelets 420 (*)    All other components within normal limits  COMPREHENSIVE METABOLIC PANEL - Abnormal; Notable for the following:    Calcium 8.6 (*)    Albumin 3.3 (*)    All other components within normal limits  POC URINE PREG, ED  GC[facility] PROBE [facility]    EKG  EKG Interpretation None       Radiology No results found.  Procedures Procedures (including critical care time)  Medications Ordered in ED Medications - No data to display   Initial Impression / Assessment and Plan / ED Course  I have reviewed the triage vital signs and the nursing notes.  Pertinent labs & imaging results that were available during my care of the patient were reviewed by me and considered in my medical decision making (see chart for details).    Patient presents with 2 weeks of lower pelvic pain and vaginal discharge. States exposure to chlamydia. No fever or vomiting.  Well-appearing on exam with benign abdomen. -HCG Labs with stable anemia.    Left-sided adnexal tenderness on exam. Ultrasound will be obtained. Patient treated for gonorrhea and chlamydia. Ultrasound shows no evidence of TOA or ovarian torsion. Abdomen is soft and recheck.  Patient will be treated for bacterial vaginosis as well as PID. Follow up with gynecology.  Return Precautions discussed.  Final Clinical Impressions(s) / ED Diagnoses   Final diagnoses:  BV (bacterial vaginosis)  PID (pelvic inflammatory disease)    New Prescriptions New Prescriptions   No medications on file   I personally performed the services described in this documentation, which was scribed in my presence. The recorded information has been reviewed and is accurate.    Glynn Octaveancour, Grayce Budden, MD 06/03/17 0630

## 2017-10-25 ENCOUNTER — Encounter (HOSPITAL_COMMUNITY): Payer: Self-pay | Admitting: Emergency Medicine

## 2017-10-25 ENCOUNTER — Other Ambulatory Visit: Payer: Self-pay

## 2017-10-25 ENCOUNTER — Emergency Department (HOSPITAL_COMMUNITY): Payer: BLUE CROSS/BLUE SHIELD

## 2017-10-25 ENCOUNTER — Emergency Department (HOSPITAL_COMMUNITY)
Admission: EM | Admit: 2017-10-25 | Discharge: 2017-10-25 | Disposition: A | Discharge status: Home / Self Care | Payer: BLUE CROSS/BLUE SHIELD | Attending: Emergency Medicine | Admitting: Emergency Medicine

## 2017-10-25 DIAGNOSIS — J45909 Unspecified asthma, uncomplicated: Secondary | ICD-10-CM | POA: Diagnosis not present

## 2017-10-25 DIAGNOSIS — S0501XA Injury of conjunctiva and corneal abrasion without foreign body, right eye, initial encounter: Secondary | ICD-10-CM

## 2017-10-25 DIAGNOSIS — Y939 Activity, unspecified: Secondary | ICD-10-CM | POA: Insufficient documentation

## 2017-10-25 DIAGNOSIS — Y929 Unspecified place or not applicable: Secondary | ICD-10-CM | POA: Insufficient documentation

## 2017-10-25 DIAGNOSIS — H5711 Ocular pain, right eye: Secondary | ICD-10-CM

## 2017-10-25 DIAGNOSIS — Y999 Unspecified external cause status: Secondary | ICD-10-CM | POA: Insufficient documentation

## 2017-10-25 DIAGNOSIS — H209 Unspecified iridocyclitis: Secondary | ICD-10-CM | POA: Diagnosis not present

## 2017-10-25 DIAGNOSIS — S0990XA Unspecified injury of head, initial encounter: Secondary | ICD-10-CM | POA: Diagnosis present

## 2017-10-25 DIAGNOSIS — E119 Type 2 diabetes mellitus without complications: Secondary | ICD-10-CM | POA: Insufficient documentation

## 2017-10-25 LAB — I-STAT BETA HCG BLOOD, ED (MC, WL, AP ONLY)

## 2017-10-25 MED ORDER — TETRACAINE HCL 0.5 % OP SOLN
2.0000 [drp] | Freq: Once | OPHTHALMIC | Status: AC
  Administered 2017-10-25: 2 [drp] via OPHTHALMIC
  Filled 2017-10-25: qty 4

## 2017-10-25 MED ORDER — ERYTHROMYCIN 5 MG/GM OP OINT: TOPICAL_OINTMENT | OPHTHALMIC | 0 refills | Status: DC

## 2017-10-25 MED ORDER — FLUORESCEIN SODIUM 1 MG OP STRP
1.0000 | ORAL_STRIP | Freq: Once | OPHTHALMIC | Status: AC
  Administered 2017-10-25: 1 via OPHTHALMIC
  Filled 2017-10-25: qty 1

## 2017-10-25 NOTE — ED Notes (Signed)
ED Provider at bedside. 
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
## 2017-10-25 NOTE — ED Notes (Signed)
Patient ambulated to CT
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
## 2018-02-22 ENCOUNTER — Other Ambulatory Visit: Payer: Self-pay

## 2018-02-22 ENCOUNTER — Inpatient Hospital Stay (HOSPITAL_COMMUNITY)
Admission: AD | Admit: 2018-02-22 | Discharge: 2018-02-22 | Disposition: A | Discharge status: Home / Self Care | Payer: BLUE CROSS/BLUE SHIELD | Source: Ambulatory Visit | Attending: Family Medicine | Admitting: Family Medicine

## 2018-02-22 DIAGNOSIS — B379 Candidiasis, unspecified: Secondary | ICD-10-CM | POA: Insufficient documentation

## 2018-02-22 DIAGNOSIS — N898 Other specified noninflammatory disorders of vagina: Secondary | ICD-10-CM | POA: Insufficient documentation

## 2018-02-22 DIAGNOSIS — E669 Obesity, unspecified: Secondary | ICD-10-CM | POA: Insufficient documentation

## 2018-02-22 DIAGNOSIS — B3731 Acute candidiasis of vulva and vagina: Secondary | ICD-10-CM

## 2018-02-22 DIAGNOSIS — B9689 Other specified bacterial agents as the cause of diseases classified elsewhere: Secondary | ICD-10-CM | POA: Insufficient documentation

## 2018-02-22 DIAGNOSIS — E119 Type 2 diabetes mellitus without complications: Secondary | ICD-10-CM | POA: Diagnosis not present

## 2018-02-22 DIAGNOSIS — B373 Candidiasis of vulva and vagina: Secondary | ICD-10-CM

## 2018-02-22 DIAGNOSIS — F419 Anxiety disorder, unspecified: Secondary | ICD-10-CM | POA: Diagnosis not present

## 2018-02-22 DIAGNOSIS — F329 Major depressive disorder, single episode, unspecified: Secondary | ICD-10-CM | POA: Insufficient documentation

## 2018-02-22 DIAGNOSIS — N760 Acute vaginitis: Secondary | ICD-10-CM | POA: Insufficient documentation

## 2018-02-22 DIAGNOSIS — J45909 Unspecified asthma, uncomplicated: Secondary | ICD-10-CM | POA: Diagnosis not present

## 2018-02-22 DIAGNOSIS — Z8619 Personal history of other infectious and parasitic diseases: Secondary | ICD-10-CM | POA: Diagnosis present

## 2018-02-22 DIAGNOSIS — R3915 Urgency of urination: Secondary | ICD-10-CM | POA: Insufficient documentation

## 2018-02-22 LAB — URINALYSIS, ROUTINE W REFLEX MICROSCOPIC
Bilirubin Urine: NEGATIVE
Glucose, UA: NEGATIVE mg/dL
Hgb urine dipstick: NEGATIVE
KETONES UR: NEGATIVE mg/dL
LEUKOCYTES UA: NEGATIVE
NITRITE: NEGATIVE
PROTEIN: NEGATIVE mg/dL
Specific Gravity, Urine: 1.026 (ref 1.005–1.030)
pH: 6 (ref 5.0–8.0)

## 2018-02-22 LAB — WET PREP, GENITAL
Sperm: NONE SEEN
TRICH WET PREP: NONE SEEN

## 2018-02-22 LAB — POCT PREGNANCY, URINE: Preg Test, Ur: NEGATIVE

## 2018-02-22 MED ORDER — FLUCONAZOLE 150 MG PO TABS: 150.0000 mg | ORAL_TABLET | Freq: Once | ORAL | 0 refills | Status: AC

## 2018-02-22 MED ORDER — METRONIDAZOLE 500 MG PO TABS: 500.0000 mg | ORAL_TABLET | Freq: Two times a day (BID) | ORAL | 0 refills | Status: DC

## 2018-02-22 NOTE — MAU Provider Note (Signed)
History     CSN: [id number]  Arrival date and time: 02/22/18 2142   None     Chief Complaint  Patient presents with  . Urinary Urgency   HPI Non pregnant patient complains of vaginal irritation over the past several days and worsening. No palliating or provoking factors. Increased foul discharge, frequency. Has had BV before. Wants to be checked for UTI.  OB History    Gravida  2   Para  1   Term      Preterm  1   AB  1   Living  0     SAB  1   TAB      Ectopic      Multiple      Live Births  1           Past Medical History:  Diagnosis Date  . 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 Anxiety   . Asthma   . Chlamydia   . Depression    doing good  . Diabetes mellitus without complication (HCC)   . Obesity   . Precocious puberty   . Prediabetes   . Trichomonal vaginitis     Past Surgical History:  Procedure Laterality Date  . DILATION AND CURETTAGE OF UTERUS N/A 11/09/2014   Procedure: DILATATION AND CURETTAGE;  Surgeon: Allie Bossier, MD;  Location: [facility];  Service: Gynecology;  Laterality: N/A;  . DILATION AND CURETTAGE OF UTERUS  2016    Family History  Problem Relation Age of Onset  . Diabetes Mother   . Hypertension Mother   . Obesity Mother   . Fibroids Mother   . Cancer Mother        thyroid  . Obesity Father   . Obesity Maternal Aunt   . Fibroids Maternal Aunt   . Obesity Maternal Uncle   . Obesity Paternal Aunt   . Diabetes Maternal Grandmother   . Obesity Maternal Grandmother   . Hypertension Maternal Grandmother   . Diabetes Maternal Grandfather   . Obesity Maternal Grandfather   . Stroke Maternal Grandfather   . Heart disease Maternal Grandfather   . Hypertension Maternal Grandfather   . Obesity Paternal Grandfather   . Heart disease Paternal Grandfather   . Bipolar disorder Brother   . Bipolar disorder Brother     Social History   Tobacco Use  . Smoking status: Never Smoker  . Smokeless  tobacco: Never Used  Substance Use Topics  . Alcohol use: No  . Drug use: No    Allergies: No Known Allergies  Medications Prior to Admission  Medication Sig Dispense Refill Last Dose  . docusate sodium (COLACE) 100 MG capsule Take 1 capsule (100 mg total) by mouth 2 (two) times daily as needed. (Patient not taking: Reported on 05/09/2017) 30 capsule 2 Completed Course at Unknown time  . doxycycline (VIBRAMYCIN) 100 MG capsule Take 1 capsule (100 mg total) by mouth 2 (two) times daily. (Patient not taking: Reported on 10/25/2017) 20 capsule 0 Completed Course at Unknown time  . erythromycin ophthalmic ointment Place a 1/2 inch ribbon of ointment into the lower eyelid. 3.5 g 0   . Norethindrone-Ethinyl Estradiol-Fe Biphas (LO LOESTRIN FE) 1 MG-10 MCG / 10 MCG tablet Take 1 tablet by mouth daily. Take 1 daily by mouth (Patient not taking: Reported on 05/09/2017) 1 Package 11 Completed Course at Unknown time  . polyethylene glycol powder (GLYCOLAX/MIRALAX) powder Take 17 g by  mouth daily. (Patient not taking: Reported on 05/09/2017) 255 g 0 Completed Course at Unknown time  . [DISCONTINUED] metroNIDAZOLE (FLAGYL) 500 MG tablet Take 1 tablet (500 mg total) by mouth 2 (two) times daily. (Patient not taking: Reported on 10/25/2017) 14 tablet 0 Completed Course at Unknown time    Review of Systems Physical Exam   Blood pressure 109/60, pulse 91, temperature 98.4 F (36.9 C), temperature source Oral, resp. rate 17, height 5' 5.5" (1.664 m), weight 268 lb 1.9 oz (121.6 kg), SpO2 99 %.  Physical Exam  Constitutional: She is oriented to person, place, and time. She appears well-developed and well-nourished.  Cardiovascular: Normal rate, regular rhythm and normal heart sounds.  Respiratory: Effort normal and breath sounds normal.  GI: Soft. She exhibits no distension. There is no tenderness. There is no rebound and no guarding.  Neurological: She is alert and oriented to person, place, and time.  Skin:  Skin is warm.  Psychiatric: She has a normal mood and affect. Her behavior is normal. Judgment and thought content normal.    Results for orders placed or performed during the hospital encounter of 02/22/18 (from the past 24 hour(s))  Urinalysis, Routine w reflex microscopic     Status: None   Collection Time: 02/22/18  9:59 PM  Result Value Ref Range   Color, Urine YELLOW YELLOW   APPearance CLEAR CLEAR   Specific Gravity, Urine 1.026 1.005 - 1.030   pH 6.0 5.0 - 8.0   Glucose, UA NEGATIVE NEGATIVE mg/dL   Hgb urine dipstick NEGATIVE NEGATIVE   Bilirubin Urine NEGATIVE NEGATIVE   Ketones, ur NEGATIVE NEGATIVE mg/dL   Protein, ur NEGATIVE NEGATIVE mg/dL   Nitrite NEGATIVE NEGATIVE   Leukocytes, UA NEGATIVE NEGATIVE  Wet prep, genital     Status: Abnormal   Collection Time: 02/22/18 10:02 PM  Result Value Ref Range   Yeast Wet Prep HPF POC PRESENT (A) NONE SEEN   Trich, Wet Prep NONE SEEN NONE SEEN   Clue Cells Wet Prep HPF POC PRESENT (A) NONE SEEN   WBC, Wet Prep HPF POC FEW (A) NONE SEEN   Sperm NONE SEEN   Pregnancy, urine POC     Status: None   Collection Time: 02/22/18 10:16 PM  Result Value Ref Range   Preg Test, Ur NEGATIVE NEGATIVE     MAU Course  Procedures  MDM   Assessment and Plan  1. BV Flagyl. No alcohol while taking medication  2. Yeast Diflucan  F/u with [facility]  Ema Hebner J Glenisha Gundry 02/22/2018, 10:58 PM

## 2018-02-22 NOTE — Discharge Instructions (Signed)

## 2018-02-22 NOTE — MAU Note (Signed)
Pt reports to MAU c/o urinary urgency. Pt states this started about 1.5 weeks ago. Pt states her urine is a dark urine and a discomfort when she is urinating. Pt states when she has to go she has to go and she is urinating too often. Pt reports LMP was 02/16/18. No bleeding or d/c.

## 2018-02-24 LAB — GC/CHLAMYDIA PROBE AMP (~~LOC~~) NOT AT ARMC
Chlamydia: NEGATIVE
Neisseria Gonorrhea: NEGATIVE

## 2018-04-02 ENCOUNTER — Inpatient Hospital Stay (HOSPITAL_COMMUNITY)
Admission: AD | Admit: 2018-04-02 | Discharge: 2018-04-02 | Disposition: A | Discharge status: Home / Self Care | Payer: BLUE CROSS/BLUE SHIELD | Source: Ambulatory Visit | Attending: Obstetrics & Gynecology | Admitting: Obstetrics & Gynecology

## 2018-04-02 ENCOUNTER — Encounter (HOSPITAL_COMMUNITY): Payer: Self-pay | Admitting: *Deleted

## 2018-04-02 ENCOUNTER — Inpatient Hospital Stay (HOSPITAL_COMMUNITY): Payer: BLUE CROSS/BLUE SHIELD

## 2018-04-02 ENCOUNTER — Other Ambulatory Visit: Payer: Self-pay

## 2018-04-02 DIAGNOSIS — Z8249 Family history of ischemic heart disease and other diseases of the circulatory system: Secondary | ICD-10-CM | POA: Diagnosis not present

## 2018-04-02 DIAGNOSIS — O3680X0 Pregnancy with inconclusive fetal viability, not applicable or unspecified: Secondary | ICD-10-CM

## 2018-04-02 DIAGNOSIS — F419 Anxiety disorder, unspecified: Secondary | ICD-10-CM | POA: Insufficient documentation

## 2018-04-02 DIAGNOSIS — J45909 Unspecified asthma, uncomplicated: Secondary | ICD-10-CM | POA: Diagnosis not present

## 2018-04-02 DIAGNOSIS — Z823 Family history of stroke: Secondary | ICD-10-CM | POA: Insufficient documentation

## 2018-04-02 DIAGNOSIS — O209 Hemorrhage in early pregnancy, unspecified: Secondary | ICD-10-CM | POA: Diagnosis not present

## 2018-04-02 DIAGNOSIS — Z818 Family history of other mental and behavioral disorders: Secondary | ICD-10-CM | POA: Insufficient documentation

## 2018-04-02 DIAGNOSIS — E669 Obesity, unspecified: Secondary | ICD-10-CM | POA: Diagnosis not present

## 2018-04-02 DIAGNOSIS — O99341 Other mental disorders complicating pregnancy, first trimester: Secondary | ICD-10-CM | POA: Diagnosis not present

## 2018-04-02 DIAGNOSIS — R634 Abnormal weight loss: Secondary | ICD-10-CM | POA: Insufficient documentation

## 2018-04-02 DIAGNOSIS — Z3A01 Less than 8 weeks gestation of pregnancy: Secondary | ICD-10-CM | POA: Diagnosis not present

## 2018-04-02 DIAGNOSIS — Z79899 Other long term (current) drug therapy: Secondary | ICD-10-CM | POA: Insufficient documentation

## 2018-04-02 DIAGNOSIS — Z9889 Other specified postprocedural states: Secondary | ICD-10-CM | POA: Diagnosis not present

## 2018-04-02 DIAGNOSIS — Z6790 Unspecified blood type, Rh positive: Secondary | ICD-10-CM

## 2018-04-02 DIAGNOSIS — F329 Major depressive disorder, single episode, unspecified: Secondary | ICD-10-CM | POA: Diagnosis not present

## 2018-04-02 DIAGNOSIS — O99511 Diseases of the respiratory system complicating pregnancy, first trimester: Secondary | ICD-10-CM | POA: Diagnosis not present

## 2018-04-02 DIAGNOSIS — O283 Abnormal ultrasonic finding on antenatal screening of mother: Secondary | ICD-10-CM

## 2018-04-02 DIAGNOSIS — Z833 Family history of diabetes mellitus: Secondary | ICD-10-CM | POA: Insufficient documentation

## 2018-04-02 DIAGNOSIS — O99211 Obesity complicating pregnancy, first trimester: Secondary | ICD-10-CM | POA: Insufficient documentation

## 2018-04-02 DIAGNOSIS — Z808 Family history of malignant neoplasm of other organs or systems: Secondary | ICD-10-CM | POA: Diagnosis not present

## 2018-04-02 DIAGNOSIS — O4690 Antepartum hemorrhage, unspecified, unspecified trimester: Secondary | ICD-10-CM

## 2018-04-02 DIAGNOSIS — O24911 Unspecified diabetes mellitus in pregnancy, first trimester: Secondary | ICD-10-CM | POA: Insufficient documentation

## 2018-04-02 LAB — CBC
HCT: 35.4 % — ABNORMAL LOW (ref 36.0–46.0)
Hemoglobin: 10.5 g/dL — ABNORMAL LOW (ref 12.0–15.0)
MCH: 22.6 pg — ABNORMAL LOW (ref 26.0–34.0)
MCHC: 29.7 g/dL — AB (ref 30.0–36.0)
MCV: 76.1 fL — ABNORMAL LOW (ref 78.0–100.0)
PLATELETS: 400 10*3/uL (ref 150–400)
RBC: 4.65 MIL/uL (ref 3.87–5.11)
RDW: 16.1 % — AB (ref 11.5–15.5)
WBC: 10.1 10*3/uL (ref 4.0–10.5)

## 2018-04-02 LAB — URINALYSIS, ROUTINE W REFLEX MICROSCOPIC
BILIRUBIN URINE: NEGATIVE
GLUCOSE, UA: NEGATIVE mg/dL
KETONES UR: NEGATIVE mg/dL
LEUKOCYTES UA: NEGATIVE
NITRITE: NEGATIVE
Protein, ur: NEGATIVE mg/dL
Specific Gravity, Urine: 1.02 (ref 1.005–1.030)
pH: 6 (ref 5.0–8.0)

## 2018-04-02 LAB — WET PREP, GENITAL
Sperm: NONE SEEN
TRICH WET PREP: NONE SEEN
Yeast Wet Prep HPF POC: NONE SEEN

## 2018-04-02 LAB — HCG, QUANTITATIVE, PREGNANCY: hCG, Beta Chain, Quant, S: 6422 m[IU]/mL — ABNORMAL HIGH (ref <5)

## 2018-04-02 LAB — POCT PREGNANCY, URINE: PREG TEST UR: POSITIVE — AB

## 2018-04-02 NOTE — MAU Note (Signed)
Urine sent to lab 

## 2018-04-02 NOTE — MAU Note (Signed)
Went to the dr today, wanting to get on a weight loss program.  Was told she is pregnant.  Is also spotting.  Had no idea she was pregnant. Wanting a 2nd opinion and is concerned about the bleeding because she has had a miscarriage before.

## 2018-04-02 NOTE — MAU Provider Note (Signed)
History     CSN: [id number]  Arrival date and time: 04/02/18 1702   First Provider Initiated Contact with Patient 04/02/18 1741      Chief Complaint  Patient presents with  . Vaginal Bleeding  . Possible Pregnancy   G3P0110 .3 wks here with spotting. She was seen at a weight loss clinic today and was told she was pregnant. She reports daily spotting for almost 2 weeks. No pain. She was not using contraception. No hx of STIs. No new partner.    OB History    Gravida  3   Para  1   Term      Preterm  1   AB  1   Living  0     SAB  1   TAB      Ectopic      Multiple      Live Births  1           Past Medical History:  Diagnosis Date  . 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 Anxiety   . Asthma   . Chlamydia   . Depression    doing good  . Diabetes mellitus without complication (HCC)   . Obesity   . Precocious puberty   . Prediabetes   . Trichomonal vaginitis     Past Surgical History:  Procedure Laterality Date  . DILATION AND CURETTAGE OF UTERUS N/A 11/09/2014   Procedure: DILATATION AND CURETTAGE;  Surgeon: Allie Bossier, MD;  Location: [facility];  Service: Gynecology;  Laterality: N/A;  . DILATION AND CURETTAGE OF UTERUS  2016    Family History  Problem Relation Age of Onset  . Diabetes Mother   . Hypertension Mother   . Obesity Mother   . Fibroids Mother   . Cancer Mother        thyroid  . Obesity Father   . Obesity Maternal Aunt   . Fibroids Maternal Aunt   . Obesity Maternal Uncle   . Obesity Paternal Aunt   . Diabetes Maternal Grandmother   . Obesity Maternal Grandmother   . Hypertension Maternal Grandmother   . Diabetes Maternal Grandfather   . Obesity Maternal Grandfather   . Stroke Maternal Grandfather   . Heart disease Maternal Grandfather   . Hypertension Maternal Grandfather   . Obesity Paternal Grandfather   . Heart disease Paternal Grandfather   . Bipolar disorder Brother   . Bipolar disorder  Brother     Social History   Tobacco Use  . Smoking status: Never Smoker  . Smokeless tobacco: Never Used  Substance Use Topics  . Alcohol use: No  . Drug use: No    Allergies: No Known Allergies  Medications Prior to Admission  Medication Sig Dispense Refill Last Dose  . metroNIDAZOLE (FLAGYL) 500 MG tablet Take 1 tablet (500 mg total) by mouth 2 (two) times daily. 14 tablet 0   . polyethylene glycol powder (GLYCOLAX/MIRALAX) powder Take 17 g by mouth daily. (Patient not taking: Reported on 05/09/2017) 255 g 0 Completed Course at Unknown time    Review of Systems  Gastrointestinal: Negative for abdominal pain.  Genitourinary: Positive for vaginal bleeding. Negative for pelvic pain and vaginal discharge.   Physical Exam   Blood pressure 109/72, pulse 78, temperature 98.6 F (37 C), temperature source Oral, resp. rate 18, weight 266 lb 12 oz (121 kg), last menstrual period 02/16/2018, SpO2 98 %.  Physical Exam  Constitutional: She  is oriented to person, place, and time. She appears well-developed and well-nourished. No distress.  HENT:  Head: Normocephalic and atraumatic.  Neck: Normal range of motion.  Cardiovascular: Normal rate.  Respiratory: Effort normal. No respiratory distress.  GI: Soft. She exhibits no distension and no mass. There is no tenderness. There is no rebound and no guarding.  Genitourinary:  Genitourinary Comments: External: no lesions or erythema Vagina: rugated, pink, moist, scant white discharge Uterus: non enlarged, anteverted, non tender, no CMT Adnexae: no masses, no tenderness left, no tenderness right Cervix closed   Musculoskeletal: Normal range of motion.  Neurological: She is alert and oriented to person, place, and time.  Skin: Skin is warm and dry.  Psychiatric: She has a normal mood and affect.   Results for orders placed or performed during the hospital encounter of 04/02/18 (from the past 24 hour(s))  Urinalysis, Routine w reflex  microscopic     Status: Abnormal   Collection Time: 04/02/18  5:15 PM  Result Value Ref Range   Color, Urine YELLOW YELLOW   APPearance CLEAR CLEAR   Specific Gravity, Urine 1.020 1.005 - 1.030   pH 6.0 5.0 - 8.0   Glucose, UA NEGATIVE NEGATIVE mg/dL   Hgb urine dipstick MODERATE (A) NEGATIVE   Bilirubin Urine NEGATIVE NEGATIVE   Ketones, ur NEGATIVE NEGATIVE mg/dL   Protein, ur NEGATIVE NEGATIVE mg/dL   Nitrite NEGATIVE NEGATIVE   Leukocytes, UA NEGATIVE NEGATIVE   RBC / HPF 0-5 0 - 5 RBC/hpf   WBC, UA 0-5 0 - 5 WBC/hpf   Bacteria, UA RARE (A) NONE SEEN   Squamous Epithelial / LPF 0-5 0 - 5   Mucus PRESENT   Pregnancy, urine POC     Status: Abnormal   Collection Time: 04/02/18  5:21 PM  Result Value Ref Range   Preg Test, Ur POSITIVE (A) NEGATIVE  CBC     Status: Abnormal   Collection Time: 04/02/18  5:43 PM  Result Value Ref Range   WBC 10.1 4.0 - 10.5 K/uL   RBC 4.65 3.87 - 5.11 MIL/uL   Hemoglobin 10.5 (L) 12.0 - 15.0 g/dL   HCT 40.9 (L) 81.1 - 91.4 %   MCV 76.1 (L) 78.0 - 100.0 fL   MCH 22.6 (L) 26.0 - 34.0 pg   MCHC 29.7 (L) 30.0 - 36.0 g/dL   RDW 78.2 (H) 95.6 - 21.3 %   Platelets 400 150 - 400 K/uL  hCG, quantitative, pregnancy     Status: Abnormal   Collection Time: 04/02/18  5:43 PM  Result Value Ref Range   hCG, Beta Chain, Quant, S 6,422 (H) <5 mIU/mL  Wet prep, genital     Status: Abnormal   Collection Time: 04/02/18  5:55 PM  Result Value Ref Range   Yeast Wet Prep HPF POC NONE SEEN NONE SEEN   Trich, Wet Prep NONE SEEN NONE SEEN   Clue Cells Wet Prep HPF POC PRESENT (A) NONE SEEN   WBC, Wet Prep HPF POC FEW (A) NONE SEEN   Sperm NONE SEEN    US Ob Less Than 14 Weeks With Ob Transvaginal  Result Date: 04/02/2018 CLINICAL DATA:  Pregnant patient with vaginal spotting. EXAM: OBSTETRIC <14 WK [place] AND TRANSVAGINAL OB US TECHNIQUE: Both transabdominal and transvaginal ultrasound examinations were performed for complete evaluation of the gestation as well as  the maternal uterus, adnexal regions, and pelvic cul-de-sac. Transvaginal technique was performed to assess early pregnancy. COMPARISON:  Pelvic ultrasound 06/03/2017 FINDINGS: Intrauterine  gestational sac: None Yolk sac:  Not Visualized. Embryo:  Not Visualized. Cardiac Activity: Not Visualized. Subchorionic hemorrhage:  None visualized. Maternal uterus/adnexae: Normal right and left ovaries. No adnexal mass identified. No free fluid in the pelvis. IMPRESSION: No intrauterine gestation identified. In the setting of positive pregnancy test and no definite intrauterine pregnancy, this reflects a pregnancy of unknown location. Differential considerations include early normal IUP, abnormal IUP, or nonvisualized ectopic pregnancy. Differentiation is achieved with serial beta HCG supplemented by repeat sonography as clinically warranted. Electronically Signed   By: Annia Belt M.D.   On: 04/02/2018 18:57   MAU Course  Procedures  MDM Labs and [place] ordered and reviewed. No IUP or adnexal mass seen on [place], findings could indicate early pregnancy, ectopic pregnancy, or failed pregnancy-discussed with pt. Will follow quant in 48 hrs. Stable for discharge home.  Assessment and Plan   1. Pregnancy, location unknown   2. Vaginal bleeding in pregnancy   3. Blood type, Rh positive    Discharge home Follow up in [facility] on 04/04/18  for labs Return precautions  Allergies as of 04/02/2018   No Known Allergies     Medication List    STOP taking these medications   metroNIDAZOLE 500 MG tablet Commonly known as:  FLAGYL   polyethylene glycol powder powder Commonly known as:  Trinidad Curet, CNM 04/02/2018, 7:15 PM

## 2018-04-02 NOTE — Discharge Instructions (Signed)
Vaginal Bleeding During Pregnancy, First Trimester °A small amount of bleeding (spotting) from the vagina is common in early pregnancy. Sometimes the bleeding is normal and is not a problem, and sometimes it is a sign of something serious. Be sure to tell your doctor about any bleeding from your vagina right away. °Follow these instructions at home: °· Watch your condition for any changes. °· Follow your doctor's instructions about how active you can be. °· If you are on bed rest: °? You may need to stay in bed and only get up to use the bathroom. °? You may be allowed to do some activities. °? If you need help, make plans for someone to help you. °· Write down: °? The number of pads you use each day. °? How often you change pads. °? How soaked (saturated) your pads are. °· Do not use tampons. °· Do not douche. °· Do not have sex or orgasms until your doctor says it is okay. °· If you pass any tissue from your vagina, save the tissue so you can show it to your doctor. °· Only take medicines as told by your doctor. °· Do not take aspirin because it can make you bleed. °· Keep all follow-up visits as told by your doctor. °Contact a doctor if: °· You bleed from your vagina. °· You have cramps. °· You have labor pains. °· You have a fever that does not go away after you take medicine. °Get help right away if: °· You have very bad cramps in your back or belly (abdomen). °· You pass large clots or tissue from your vagina. °· You bleed more. °· You feel light-headed or weak. °· You pass out (faint). °· You have chills. °· You are leaking fluid or have a gush of fluid from your vagina. °· You pass out while pooping (having a bowel movement). °This information is not intended to replace advice given to you by your health care provider. Make sure you discuss any questions you have with your health care provider. °Document Released: 03/08/2014 Document Revised: 03/29/2016 Document Reviewed: 06/29/2013 °Elsevier Interactive  Patient Education © 2018 Elsevier Inc. ° °

## 2018-04-03 LAB — GC/CHLAMYDIA PROBE AMP (~~LOC~~) NOT AT ARMC
Chlamydia: NEGATIVE
Neisseria Gonorrhea: NEGATIVE

## 2018-04-04 ENCOUNTER — Encounter: Payer: Self-pay | Admitting: *Deleted

## 2018-04-04 ENCOUNTER — Ambulatory Visit (INDEPENDENT_AMBULATORY_CARE_PROVIDER_SITE_OTHER): Payer: BLUE CROSS/BLUE SHIELD | Admitting: *Deleted

## 2018-04-04 DIAGNOSIS — O3680X0 Pregnancy with inconclusive fetal viability, not applicable or unspecified: Secondary | ICD-10-CM

## 2018-04-04 DIAGNOSIS — O283 Abnormal ultrasonic finding on antenatal screening of mother: Secondary | ICD-10-CM

## 2018-04-04 LAB — HCG, QUANTITATIVE, PREGNANCY: HCG, BETA CHAIN, QUANT, S: 5986 m[IU]/mL — AB (ref <5)

## 2018-04-04 NOTE — Progress Notes (Signed)
Patient presents to office today for stat bhcg. Patient denies pain and states bleeding stopped the day after she left the hospital. Discussed with patient we are monitoring her bhcg levels today & asked she wait in lobby for results/updated plan of care. Patient verbalized understanding & had no questions at this time.

## 2018-04-04 NOTE — Progress Notes (Signed)
Results reviewed with Rolitta Dawson,CNM and informed patient we can not yet conclusively verify if she is having a miscarriage, ectopic pregnancy or early pregnancy. Reviewed bhcg results with her and informed her we recommend she have another stat  bhcg in 48 hours on Sunday afternoon . Explained she will need to go to MAU and I will call them so they are expecting her to come.  Also gave her ectopic precautions. She voices understanding.

## 2018-04-06 ENCOUNTER — Inpatient Hospital Stay (HOSPITAL_COMMUNITY)
Admission: AD | Admit: 2018-04-06 | Discharge: 2018-04-06 | Disposition: A | Discharge status: Home / Self Care | Payer: BLUE CROSS/BLUE SHIELD | Source: Ambulatory Visit | Attending: Family Medicine | Admitting: Family Medicine

## 2018-04-06 DIAGNOSIS — O009 Unspecified ectopic pregnancy without intrauterine pregnancy: Secondary | ICD-10-CM | POA: Diagnosis not present

## 2018-04-06 DIAGNOSIS — O209 Hemorrhage in early pregnancy, unspecified: Secondary | ICD-10-CM | POA: Diagnosis present

## 2018-04-06 DIAGNOSIS — O3680X0 Pregnancy with inconclusive fetal viability, not applicable or unspecified: Secondary | ICD-10-CM

## 2018-04-06 LAB — CBC WITH DIFFERENTIAL/PLATELET
Basophils Absolute: 0 10*3/uL (ref 0.0–0.1)
Basophils Relative: 0 %
Eosinophils Absolute: 0.1 10*3/uL (ref 0.0–0.7)
Eosinophils Relative: 1 %
HCT: 34.7 % — ABNORMAL LOW (ref 36.0–46.0)
Hemoglobin: 10.3 g/dL — ABNORMAL LOW (ref 12.0–15.0)
Lymphocytes Relative: 29 %
Lymphs Abs: 3.3 10*3/uL (ref 0.7–4.0)
MCH: 22.8 pg — ABNORMAL LOW (ref 26.0–34.0)
MCHC: 29.7 g/dL — ABNORMAL LOW (ref 30.0–36.0)
MCV: 76.9 fL — ABNORMAL LOW (ref 78.0–100.0)
Monocytes Absolute: 0.4 10*3/uL (ref 0.1–1.0)
Monocytes Relative: 3 %
Neutro Abs: 7.8 10*3/uL — ABNORMAL HIGH (ref 1.7–7.7)
Neutrophils Relative %: 67 %
Platelets: 405 10*3/uL — ABNORMAL HIGH (ref 150–400)
RBC: 4.51 MIL/uL (ref 3.87–5.11)
RDW: 16 % — ABNORMAL HIGH (ref 11.5–15.5)
WBC: 11.7 10*3/uL — ABNORMAL HIGH (ref 4.0–10.5)

## 2018-04-06 LAB — TYPE AND SCREEN
ABO/RH(D): O POS
Antibody Screen: NEGATIVE

## 2018-04-06 LAB — HCG, QUANTITATIVE, PREGNANCY: hCG, Beta Chain, Quant, S: 4919 m[IU]/mL — ABNORMAL HIGH (ref <5)

## 2018-04-06 LAB — AST: AST: 17 U/L (ref 15–41)

## 2018-04-06 LAB — CREATININE, SERUM
Creatinine, Ser: 0.52 mg/dL (ref 0.44–1.00)
GFR calc Af Amer: >60 mL/min (ref >60)
GFR calc non Af Amer: >60 mL/min (ref >60)

## 2018-04-06 LAB — BUN: BUN: 7 mg/dL (ref 6–20)

## 2018-04-06 MED ORDER — METHOTREXATE INJECTION FOR WOMEN'S HOSPITAL
50.0000 mg/m2 | Freq: Once | INTRAMUSCULAR | Status: AC
  Administered 2018-04-06: 120 mg via INTRAMUSCULAR
  Filled 2018-04-06: qty 2.4

## 2018-04-06 NOTE — MAU Provider Note (Signed)
Chief Complaint: Labs Only   First Provider Initiated Contact with Patient 04/06/18 2020      SUBJECTIVE HPI: Jaime Ramos is a 22 y.o. G3P0110 at 4244w0d by LMP who presents to maternity admissions for follow up lab work. She was seen in MAU on 5/29 for vaginal bleeding and possible pregnancy, she was noted to be 5559w3d by LMP- no IUP seen on US and HCG level was 6,422. She returned on 5/31 for repeat labs and HCG dropped slightly to 5,986. She reports no complaints today in MAU, denies continued vaginal bleeding and pain. She reports her vaginal bleeding stopped on 5/30 after spotting for 2 weeks. She reports this being an unexpected pregnancy as she found out she was pregnancy at the weight loss clinic on 5/29.   Past Medical History:  Diagnosis Date  . 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 Anxiety   . Asthma   . Chlamydia   . Depression    doing good  . Diabetes mellitus without complication (HCC)   . Obesity   . Precocious puberty   . Prediabetes   . Trichomonal vaginitis    Past Surgical History:  Procedure Laterality Date  . DILATION AND CURETTAGE OF UTERUS N/A 11/09/2014   Procedure: DILATATION AND CURETTAGE;  Surgeon: [name], MD;  Location: [facility];  Service: Gynecology;  Laterality: N/A;  . DILATION AND CURETTAGE OF UTERUS  2016   Social History   Socioeconomic History  . Marital status: Single    Spouse name: Not on file  . Number of children: Not on file  . Years of education: Not on file  . Highest education level: Not on file  Occupational History  . Not on file  Social Needs  . Financial resource strain: Not on file  . Food insecurity:    Worry: Not on file    Inability: Not on file  . Transportation needs:    Medical: Not on file    Non-medical: Not on file  Tobacco Use  . Smoking status: Never Smoker  . Smokeless tobacco: Never Used  Substance and Sexual Activity  . Alcohol use: No  . Drug use: No  . Sexual activity: Yes     Birth control/protection: None  Lifestyle  . Physical activity:    Days per week: Not on file    Minutes per session: Not on file  . Stress: Not on file  Relationships  . Social connections:    Talks on phone: Not on file    Gets together: Not on file    Attends religious service: Not on file    Active member of club or organization: Not on file    Attends meetings of clubs or organizations: Not on file    Relationship status: Not on file  . Intimate partner violence:    Fear of current or ex partner: Not on file    Emotionally abused: Not on file    Physically abused: Not on file    Forced sexual activity: Not on file  Other Topics Concern  . Not on file  Social History Narrative   Lives with mom and step father. Rare visits with biologic dad. 11 th [employer] Wants to play softball this year.    No current facility-administered medications on file prior to encounter.    No current outpatient medications on file prior to encounter.   No Known Allergies  ROS:  Review of  Systems  Respiratory: Negative.   Cardiovascular: Negative.   Gastrointestinal: Negative.   Genitourinary: Negative.    I have reviewed patient's Past Medical Hx, Surgical Hx, Family Hx, Social Hx, medications and allergies.   Physical Exam   Patient Vitals for the past 24 hrs:  BP Temp Temp src Pulse Resp Height Weight  04/06/18 2228 107/63 - - 87 18 - -  04/06/18 1950 108/76 98.8 F (37.1 C) Oral (!) 108 18 5\' 6"  (1.676 m) 267 lb (121.1 kg)   Constitutional: Well-developed, well-nourished female in no acute distress.  Cardiovascular: normal rate Respiratory: normal effort GI: Abd soft, non-tender. Neurologic: Alert and oriented x 4.   LAB RESULTS Results for orders placed or performed during the hospital encounter of 04/06/18 (from the past 24 hour(s))  hCG, quantitative, pregnancy     Status: Abnormal   Collection Time: 04/06/18  7:41 PM  Result Value Ref Range   hCG,  Beta Chain, Quant, S 4,919 (H) <5 mIU/mL  CBC WITH DIFFERENTIAL     Status: Abnormal   Collection Time: 04/06/18  9:02 PM  Result Value Ref Range   WBC 11.7 (H) 4.0 - 10.5 K/uL   RBC 4.51 3.87 - 5.11 MIL/uL   Hemoglobin 10.3 (L) 12.0 - 15.0 g/dL   HCT 29.5 (L) 28.4 - 13.2 %   MCV 76.9 (L) 78.0 - 100.0 fL   MCH 22.8 (L) 26.0 - 34.0 pg   MCHC 29.7 (L) 30.0 - 36.0 g/dL   RDW 44.0 (H) 10.2 - 72.5 %   Platelets 405 (H) 150 - 400 K/uL   Neutrophils Relative % 67 %   Neutro Abs 7.8 (H) 1.7 - 7.7 K/uL   Lymphocytes Relative 29 %   Lymphs Abs 3.3 0.7 - 4.0 K/uL   Monocytes Relative 3 %   Monocytes Absolute 0.4 0.1 - 1.0 K/uL   Eosinophils Relative 1 %   Eosinophils Absolute 0.1 0.0 - 0.7 K/uL   Basophils Relative 0 %   Basophils Absolute 0.0 0.0 - 0.1 K/uL  AST     Status: None   Collection Time: 04/06/18  9:02 PM  Result Value Ref Range   AST 17 15 - 41 U/L  BUN     Status: None   Collection Time: 04/06/18  9:02 PM  Result Value Ref Range   BUN 7 6 - 20 mg/dL  Creatinine, serum     Status: None   Collection Time: 04/06/18  9:02 PM  Result Value Ref Range   Creatinine, Ser 0.52 0.44 - 1.00 mg/dL   GFR calc non Af Amer >60 >60 mL/min   GFR calc Af Amer >60 >60 mL/min  Type and screen [facility]     Status: None   Collection Time: 04/06/18  9:02 PM  Result Value Ref Range   ABO/RH(D) O POS    Antibody Screen NEG    Sample Expiration      04/09/2018 Performed at [facility], [address] [place], [place] [address]     IMAGING US Ob Less Than 14 Weeks With Ob Transvaginal  Result Date: 04/02/2018 CLINICAL DATA:  Pregnant patient with vaginal spotting. EXAM: OBSTETRIC <14 WK [place] AND TRANSVAGINAL OB US TECHNIQUE: Both transabdominal and transvaginal ultrasound examinations were performed for complete evaluation of the gestation as well as the maternal uterus, adnexal regions, and pelvic cul-de-sac. Transvaginal technique was performed to assess early  pregnancy. COMPARISON:  Pelvic ultrasound 06/03/2017 FINDINGS: Intrauterine gestational sac: None Yolk sac:  Not Visualized. Embryo:  Not Visualized. Cardiac Activity: Not Visualized. Subchorionic hemorrhage:  None visualized. Maternal uterus/adnexae: Normal right and left ovaries. No adnexal mass identified. No free fluid in the pelvis. IMPRESSION: No intrauterine gestation identified. In the setting of positive pregnancy test and no definite intrauterine pregnancy, this reflects a pregnancy of unknown location. Differential considerations include early normal IUP, abnormal IUP, or nonvisualized ectopic pregnancy. Differentiation is achieved with serial beta HCG supplemented by repeat sonography as clinically warranted. Electronically Signed   By: Annia Belt M.D.   On: 04/02/2018 18:57    MAU Management/MDM: Orders Placed This Encounter  Procedures  . hCG, quantitative, pregnancy  . CBC WITH DIFFERENTIAL  . AST  . BUN  . Creatinine, serum  . OB/GYN Facility Practice patients are to remain on hospital grounds until lab results are received and injection given. Patient may leave MAU but must stay on hospital grounds.  . Type and screen [facility]  . Discharge patient Discharge disposition: 01-Home or Self Care; Discharge patient date: 04/06/2018   HCG- 6,422 on 5/29, 5,986 on 5/31 and 4,919 today- HCG has no significant drop or rise suspicious of ectopic pregnancy.   Meds ordered this encounter  Medications  . methotrexate (50 mg/ml) chemo injection 120 mg   Consult Dr Shawnie Pons with HCG lab results- recommends methotrexate therapy for unknown location of pregnancy and no significant drop or rise in HCG.   Discussed results with patient, recommendations and therapy. Patient agrees to methotrexate- patient has close access to hospital with transportation. Additional labs obtained for methotrexate therapy.  Methotrexate labs WNL.   Pt discharged with strict ectopic precautions.  Pt stable at time of discharge. F/u on Wednesday in clinic for repeat lab work. Discussed reasons to return to MAU and what to expect with methotrexate therapy. Pt verbalizes understanding. All questions answered.   ASSESSMENT 1. Pregnancy of unknown anatomic location   2. Ectopic pregnancy without intrauterine pregnancy, unspecified location     PLAN Discharge home Work note given to patient  F/u on Wednesday at 0830 for repeat lab work in clinic  Return to MAU as needed for reasons discussed or emergencies  Tylenol can be used for pain management   Follow-up Information    [facility]. Go on 04/09/2018.   Specialty:  Obstetrics and Gynecology Why:  Go to clinic on Wednesday 04/09/18 at 0830 for repeat labs  Contact information: [address] [place] [address] [phone]          Allergies as of 04/06/2018   No Known Allergies     Medication List    You have not been prescribed any medications.     Steward Drone  Certified Nurse-Midwife 04/06/2018  10:46 PM

## 2018-04-06 NOTE — MAU Note (Signed)
Pt here for repeat hcg level. No complaints. Pt no longer spotting and denies pain

## 2018-04-09 ENCOUNTER — Ambulatory Visit: Payer: Self-pay

## 2018-04-09 ENCOUNTER — Telehealth: Payer: Self-pay | Admitting: General Practice

## 2018-04-09 NOTE — Telephone Encounter (Signed)
Patient no showed for stat bhcg appt today. Called patient and she states she thought the appt was tomorrow morning. Recommended she go to MAU tonight for recheck and she states she cannot because she has to work but patient is agreeable to come tomorrow morning at 830. Patient had no questions.

## 2018-04-10 ENCOUNTER — Ambulatory Visit (INDEPENDENT_AMBULATORY_CARE_PROVIDER_SITE_OTHER): Payer: BLUE CROSS/BLUE SHIELD | Admitting: General Practice

## 2018-04-10 ENCOUNTER — Telehealth: Payer: Self-pay | Admitting: General Practice

## 2018-04-10 DIAGNOSIS — O3680X0 Pregnancy with inconclusive fetal viability, not applicable or unspecified: Secondary | ICD-10-CM

## 2018-04-10 DIAGNOSIS — O283 Abnormal ultrasonic finding on antenatal screening of mother: Secondary | ICD-10-CM

## 2018-04-10 LAB — HCG, QUANTITATIVE, PREGNANCY: HCG, BETA CHAIN, QUANT, S: 3817 m[IU]/mL — AB (ref <5)

## 2018-04-10 NOTE — Progress Notes (Signed)
Patient presents to office today for stat bhcg following MTX on 6/2- today is day #5 labs. Patient denies pain or bleeding. Discussed with patient we are monitoring her bhcg levels today and asked she wait in lobby for results/updated plan of care. Patient verbalized understanding to all & had no questions at this time.  Reviewed results with Dr Shawnie PonsPratt who finds decreasing bhcg levels, patient should have follow up on Saturday for day #7 labs.   I was informed by another staff member, the patient left and was asked to be contacted with results. Will call patient.

## 2018-04-10 NOTE — Progress Notes (Signed)
Patient seen and assessed by nursing staff.  Agree with documentation and plan.  

## 2018-04-10 NOTE — Telephone Encounter (Signed)
Called patient with stat bhcg results and informed her of need to return on Saturday for day #7 labs in MAU. Patient verbalized understanding & asked if it was common to not have bleeding. Told patient yes sometimes patient's don't have much bleeding at all. Patient verbalized understanding & had no questions.

## 2018-04-24 ENCOUNTER — Inpatient Hospital Stay (HOSPITAL_COMMUNITY)
Admission: AD | Admit: 2018-04-24 | Discharge: 2018-04-24 | Disposition: A | Discharge status: Home / Self Care | Payer: BLUE CROSS/BLUE SHIELD | Source: Ambulatory Visit | Attending: Obstetrics & Gynecology | Admitting: Obstetrics & Gynecology

## 2018-04-24 ENCOUNTER — Other Ambulatory Visit: Payer: Self-pay

## 2018-04-24 DIAGNOSIS — O0281 Inappropriate change in quantitative human chorionic gonadotropin (hCG) in early pregnancy: Secondary | ICD-10-CM | POA: Diagnosis present

## 2018-04-24 DIAGNOSIS — O009 Unspecified ectopic pregnancy without intrauterine pregnancy: Secondary | ICD-10-CM | POA: Insufficient documentation

## 2018-04-24 LAB — HCG, QUANTITATIVE, PREGNANCY: HCG, BETA CHAIN, QUANT, S: 319 m[IU]/mL — AB (ref <5)

## 2018-04-24 NOTE — MAU Note (Signed)
Had a family emergency at the [place] [facility], has been out of town. Pt had received MTX on 6/2, has not had any bleeding. Has random strange pains.  Doing ok today.

## 2018-04-24 NOTE — MAU Provider Note (Signed)
S: 22 y.o. G3P0110 @[redacted]w[redacted]d  by LMP presents to MAU for repeat hcg.  She had methotrexate for ectopic pregnancy on 04/06/18. Her hcg on 04/06/18 was 4949.  She missed her appt in the office on Day 4, and came on Day 5 but did not stay in the lobby for results.  Her hcg on Day 5 was 3817. She reports she had a family emergency and has been out of town so missed her Day 7 labwork. She presents today for hcg level. She reports some mild intermittent cramping and denies any bleeding today.   HPI  O: BP 113/65 (BP Location: Right Arm)   Pulse 78   Temp 98.6 F (37 C) (Oral)   Resp 16   Ht 5\' 6"  (1.676 m)   Wt 270 lb 8 oz (122.7 kg)   LMP 02/16/2018 Comment: spotting since 5/18- thought it was her period  SpO2 100%   BMI 43.66 kg/m   VS reviewed, nursing note reviewed,  Constitutional: well developed, well nourished, no distress HEENT: normocephalic CV: normal rate Pulm/chest wall: normal effort Abdomen: soft Neuro: alert and oriented x 3 Skin: warm, dry Psych: affect normal  Results for orders placed or performed during the hospital encounter of 04/24/18 (from the past 24 hour(s))  hCG, quantitative, pregnancy     Status: Abnormal   Collection Time: 04/24/18  7:20 AM  Result Value Ref Range   hCG, Beta Chain, Quant, S 319 (H) <5 mIU/mL    --/--/O POS (06/02 2102)  MDM: Ordered labs/reviewed results.  Quant hcg dropped significantly.  Discussed results with pt.  Pt to follow up in office weekly for nonstat lab until hcg below 5.  Ectopic precautions given and pt to return to MAU sooner if s/sx of ectopic, as ruptured ectopic can be life threatening.  Pt stable at time of discharge.  A: 1. Ectopic pregnancy without intrauterine pregnancy, unspecified location     P: D/C home with ectopic/bleeding precautions F/U with outpatient labs weekly Return to MAU as needed for emergencies  LEFTWICH-KIRBY, Kileen Lange, CNM 2:18 PM

## 2018-04-30 ENCOUNTER — Other Ambulatory Visit: Payer: Self-pay | Admitting: *Deleted

## 2018-04-30 DIAGNOSIS — O009 Unspecified ectopic pregnancy without intrauterine pregnancy: Secondary | ICD-10-CM

## 2018-05-02 ENCOUNTER — Other Ambulatory Visit: Payer: BLUE CROSS/BLUE SHIELD

## 2018-05-02 DIAGNOSIS — O009 Unspecified ectopic pregnancy without intrauterine pregnancy: Secondary | ICD-10-CM

## 2018-05-02 NOTE — Progress Notes (Unsigned)
Here for repeat non stat bhcg. Informed lab she was bleeding- lab asked RN to speak to patient. Orelia reports she started bleeding about 2 days ago - at first heavy- changing pad every 1.5 hours ; but now less - only changing every 2.5-3 hours and having cramping =7 or 8.  Reviewed chart and patient complaints with Nolene Bernheimerri Burleson, NP who states this is likely normal sloughing off of endometrial lining.  Informed patient of this and that she may not take tylenol or ibuprofen if needed ; also to come back to MAU if severe pain or heavy bleeding and that we should be calling her next week with bhcg results. She voices understanding.

## 2018-05-03 LAB — BETA HCG QUANT (REF LAB): HCG QUANT: 91 m[IU]/mL

## 2018-05-05 ENCOUNTER — Telehealth: Payer: Self-pay | Admitting: Advanced Practice Midwife

## 2018-05-05 NOTE — Telephone Encounter (Signed)
cancelled

## 2018-05-09 ENCOUNTER — Other Ambulatory Visit: Payer: Self-pay | Admitting: General Practice

## 2018-05-09 ENCOUNTER — Other Ambulatory Visit: Payer: Self-pay

## 2018-05-09 DIAGNOSIS — O039 Complete or unspecified spontaneous abortion without complication: Secondary | ICD-10-CM

## 2018-05-16 ENCOUNTER — Other Ambulatory Visit: Payer: Self-pay

## 2018-05-23 ENCOUNTER — Other Ambulatory Visit: Payer: Self-pay

## 2018-09-23 ENCOUNTER — Telehealth: Payer: Self-pay | Admitting: *Deleted

## 2018-09-23 ENCOUNTER — Encounter (HOSPITAL_COMMUNITY): Payer: Self-pay | Admitting: *Deleted

## 2018-09-23 ENCOUNTER — Other Ambulatory Visit: Payer: Self-pay

## 2018-09-23 ENCOUNTER — Inpatient Hospital Stay (HOSPITAL_COMMUNITY)
Admission: AD | Admit: 2018-09-23 | Discharge: 2018-09-23 | Disposition: A | Discharge status: Home / Self Care | Payer: BLUE CROSS/BLUE SHIELD | Source: Ambulatory Visit | Attending: Obstetrics & Gynecology | Admitting: Obstetrics & Gynecology

## 2018-09-23 DIAGNOSIS — R309 Painful micturition, unspecified: Secondary | ICD-10-CM | POA: Insufficient documentation

## 2018-09-23 DIAGNOSIS — N3091 Cystitis, unspecified with hematuria: Secondary | ICD-10-CM

## 2018-09-23 DIAGNOSIS — Z3202 Encounter for pregnancy test, result negative: Secondary | ICD-10-CM | POA: Diagnosis not present

## 2018-09-23 DIAGNOSIS — A5901 Trichomonal vulvovaginitis: Secondary | ICD-10-CM | POA: Diagnosis not present

## 2018-09-23 DIAGNOSIS — A599 Trichomoniasis, unspecified: Secondary | ICD-10-CM | POA: Diagnosis not present

## 2018-09-23 LAB — URINALYSIS, ROUTINE W REFLEX MICROSCOPIC
BACTERIA UA: NONE SEEN
Bilirubin Urine: NEGATIVE
Glucose, UA: NEGATIVE mg/dL
HGB URINE DIPSTICK: NEGATIVE
Ketones, ur: NEGATIVE mg/dL
NITRITE: NEGATIVE
Protein, ur: NEGATIVE mg/dL
Specific Gravity, Urine: 1.024 (ref 1.005–1.030)
pH: 5 (ref 5.0–8.0)

## 2018-09-23 LAB — POCT PREGNANCY, URINE: Preg Test, Ur: NEGATIVE

## 2018-09-23 LAB — WET PREP, GENITAL
Clue Cells Wet Prep HPF POC: NONE SEEN
SPERM: NONE SEEN
YEAST WET PREP: NONE SEEN

## 2018-09-23 LAB — GC/CHLAMYDIA PROBE AMP (~~LOC~~) NOT AT ARMC
Chlamydia: NEGATIVE
Neisseria Gonorrhea: NEGATIVE

## 2018-09-23 MED ORDER — SULFAMETHOXAZOLE-TRIMETHOPRIM 800-160 MG PO TABS: 1.0000 | ORAL_TABLET | Freq: Two times a day (BID) | ORAL | 0 refills | Status: AC

## 2018-09-23 MED ORDER — METRONIDAZOLE 500 MG PO TABS
2000.0000 mg | ORAL_TABLET | Freq: Once | ORAL | Status: AC
  Administered 2018-09-23: 2000 mg via ORAL
  Filled 2018-09-23: qty 4

## 2018-09-23 NOTE — Telephone Encounter (Signed)
Tried to call pt to set up pap appt.  LMOM.  09-23-18  AS

## 2018-09-23 NOTE — MAU Provider Note (Signed)
History     CSN: [id number]  Arrival date and time: 09/23/18 [id number]   First Provider Initiated Contact with Patient 09/23/18 [phone]      Chief Complaint  Patient presents with  . vaginal burning with urination   HPI Jaime ManisMichaela Ramos is a 22 y.o. [phone] non-pregnant patient who presents to MAU with chief complaint of vaginal itching and pain with urination. These are new problems, onset within the past few days. Patient denies abnormal vaginal discharge but endorses slightly foul-smell to her discharge. Pain with urination is suprapubic, rated as 2/10, does not radiate, no aggravating or alleviating factors. Denies abdominal pain outside of urination, flank pain, fever, falls or recent illness.  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 Subsequently seen by Dr. Despina HiddenEure and swabbed with acetic acid solution. He advised follow-up in one year   Past Medical History:  Diagnosis Date  . 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 Anxiety   . Asthma   . Chlamydia   . Depression    doing good  . Diabetes mellitus without complication (HCC)    Pre Diabetic  . Obesity   . Precocious puberty   . Prediabetes   . Trichomonal vaginitis     Past Surgical History:  Procedure Laterality Date  . DILATION AND CURETTAGE OF UTERUS N/A 11/09/2014   Procedure: DILATATION AND CURETTAGE;  Surgeon: [name], MD;  Location: [facility];  Service: Gynecology;  Laterality: N/A;  . DILATION AND CURETTAGE OF UTERUS  2016    Family History  Problem Relation Age of Onset  . Diabetes Mother   . Hypertension Mother   . Obesity Mother   . Fibroids Mother   . Cancer Mother        thyroid  . Obesity Father   . Obesity Maternal Aunt   . Fibroids  Maternal Aunt   . Obesity Maternal Uncle   . Obesity Paternal Aunt   . Diabetes Maternal Grandmother   . Obesity Maternal Grandmother   . Hypertension Maternal Grandmother   . Diabetes Maternal Grandfather   . Obesity Maternal Grandfather   . Stroke Maternal Grandfather   . Heart disease Maternal Grandfather   . Hypertension Maternal Grandfather   . Obesity Paternal Grandfather   . Heart disease Paternal Grandfather   . Bipolar disorder Brother   . Bipolar disorder Brother     Social History   Tobacco Use  . Smoking status: Never Smoker  . Smokeless tobacco: Never Used  Substance Use Topics  . Alcohol use: No  . Drug use: No    Allergies: No Known Allergies  No medications prior to admission.    Review of Systems  Constitutional: Negative for fever.  Gastrointestinal: Negative for abdominal pain, nausea and vomiting.  Genitourinary: Positive for dysuria. Negative for difficulty urinating, flank pain, vaginal bleeding, vaginal discharge and vaginal pain.       + vaginal itching  Musculoskeletal: Negative for back pain.  All other systems reviewed and are negative.  Physical Exam   Blood pressure (!) 110/55, pulse 84, temperature 98.6 F (37 C), temperature source Oral, resp. rate 18, weight 124.3 kg, last menstrual period 02/14/2018, unknown if currently breastfeeding.  Physical Exam  Nursing note and vitals reviewed. Constitutional: She appears well-developed and  well-nourished.  Cardiovascular: Normal rate, normal heart sounds and intact distal pulses.  Respiratory: Effort normal. No respiratory distress.  GI: Soft. Bowel sounds are normal. She exhibits no distension. There is no tenderness. There is no rebound, no guarding and no CVA tenderness.  Genitourinary: Vaginal discharge found.  Genitourinary Comments: Foul smell noted  Skin: Skin is warm and dry.  Psychiatric: She has a normal mood and affect. Her behavior is normal. Judgment and thought content  normal.    MAU Course  Procedures  MDM   Patient Vitals for the past 24 hrs:  BP Temp Temp src Pulse Resp Weight  09/23/18 0701 (!) 110/55 98.6 F (37 C) Oral 84 18 124.3 kg    Results for orders placed or performed during the hospital encounter of 09/23/18 (from the past 24 hour(s))  Pregnancy, urine POC     Status: None   Collection Time: 09/23/18  7:04 AM  Result Value Ref Range   Preg Test, Ur NEGATIVE NEGATIVE  Urinalysis, Routine w reflex microscopic     Status: Abnormal   Collection Time: 09/23/18  7:09 AM  Result Value Ref Range   Color, Urine AMBER (A) YELLOW   APPearance HAZY (A) CLEAR   Specific Gravity, Urine 1.024 1.005 - 1.030   pH 5.0 5.0 - 8.0   Glucose, UA NEGATIVE NEGATIVE mg/dL   Hgb urine dipstick NEGATIVE NEGATIVE   Bilirubin Urine NEGATIVE NEGATIVE   Ketones, ur NEGATIVE NEGATIVE mg/dL   Protein, ur NEGATIVE NEGATIVE mg/dL   Nitrite NEGATIVE NEGATIVE   Leukocytes, UA MODERATE (A) NEGATIVE   RBC / HPF 6-10 0 - 5 RBC/hpf   WBC, UA 21-50 0 - 5 WBC/hpf   Bacteria, UA NONE SEEN NONE SEEN   Squamous Epithelial / LPF 11-20 0 - 5   Mucus PRESENT   Wet prep, genital     Status: Abnormal   Collection Time: 09/23/18  7:29 AM  Result Value Ref Range   Yeast Wet Prep HPF POC NONE SEEN NONE SEEN   Trich, Wet Prep PRESENT (A) NONE SEEN   Clue Cells Wet Prep HPF POC NONE SEEN NONE SEEN   WBC, Wet Prep HPF POC FEW (A) NONE SEEN   Sperm NONE SEEN     Meds ordered this encounter  Medications  . metroNIDAZOLE (FLAGYL) tablet 2,000 mg  . sulfamethoxazole-trimethoprim (BACTRIM DS,SEPTRA DS) 800-160 MG tablet    Sig: Take 1 tablet by mouth 2 (two) times daily for 7 days.    Dispense:  14 tablet    Refill:  0    Order Specific Question:   Supervising Provider    Answer:   Reva Bores [[id number]]    Assessment and Plan  --22 y.o. G3P0120 non-pregnant patient --POSITIVE Trich, treated in MAU --Given paper Rx for expedited partner treatment.  --Advised avoiding  intercourse for two weeks after treatment. Condoms not 100% protective --Discharge home in stable condition  F/U: Will message [facility] to schedule one year followup due March 2019  Jaime Ramos, [place] 09/23/2018, 8:22 AM

## 2018-09-23 NOTE — Progress Notes (Signed)
Vaginal cultures obtained by RN. 

## 2018-09-23 NOTE — MAU Note (Signed)
Pt reports to MAU c/o vaginal burning with urination. Pt reports a small vaginal odor. No bleeding LMP was 09/16/18.

## 2018-09-23 NOTE — Discharge Instructions (Signed)

## 2018-11-14 ENCOUNTER — Other Ambulatory Visit: Payer: Self-pay | Admitting: Adult Health

## 2018-11-24 ENCOUNTER — Emergency Department (HOSPITAL_COMMUNITY)
Admission: EM | Admit: 2018-11-24 | Discharge: 2018-11-24 | Disposition: A | Discharge status: Home / Self Care | Payer: BLUE CROSS/BLUE SHIELD | Attending: Emergency Medicine | Admitting: Emergency Medicine

## 2018-11-24 ENCOUNTER — Encounter (HOSPITAL_COMMUNITY): Payer: Self-pay | Admitting: Emergency Medicine

## 2018-11-24 ENCOUNTER — Other Ambulatory Visit: Payer: Self-pay

## 2018-11-24 DIAGNOSIS — J45909 Unspecified asthma, uncomplicated: Secondary | ICD-10-CM | POA: Insufficient documentation

## 2018-11-24 DIAGNOSIS — E119 Type 2 diabetes mellitus without complications: Secondary | ICD-10-CM | POA: Diagnosis not present

## 2018-11-24 DIAGNOSIS — B3731 Acute candidiasis of vulva and vagina: Secondary | ICD-10-CM

## 2018-11-24 DIAGNOSIS — B9689 Other specified bacterial agents as the cause of diseases classified elsewhere: Secondary | ICD-10-CM | POA: Diagnosis not present

## 2018-11-24 DIAGNOSIS — R300 Dysuria: Secondary | ICD-10-CM | POA: Diagnosis present

## 2018-11-24 DIAGNOSIS — B373 Candidiasis of vulva and vagina: Secondary | ICD-10-CM | POA: Insufficient documentation

## 2018-11-24 DIAGNOSIS — N760 Acute vaginitis: Secondary | ICD-10-CM | POA: Diagnosis not present

## 2018-11-24 LAB — RPR: RPR Ser Ql: NONREACTIVE

## 2018-11-24 LAB — URINALYSIS, ROUTINE W REFLEX MICROSCOPIC
BILIRUBIN URINE: NEGATIVE
Glucose, UA: NEGATIVE mg/dL
Hgb urine dipstick: NEGATIVE
KETONES UR: NEGATIVE mg/dL
LEUKOCYTES UA: NEGATIVE
Nitrite: NEGATIVE
Protein, ur: 30 mg/dL — AB
Specific Gravity, Urine: 1.025 (ref 1.005–1.030)
pH: 8 (ref 5.0–8.0)

## 2018-11-24 LAB — WET PREP, GENITAL
Sperm: NONE SEEN
Trich, Wet Prep: NONE SEEN

## 2018-11-24 LAB — GC/CHLAMYDIA PROBE AMP (~~LOC~~) NOT AT ARMC
Chlamydia: NEGATIVE
Neisseria Gonorrhea: NEGATIVE

## 2018-11-24 LAB — POC URINE PREG, ED: Preg Test, Ur: NEGATIVE

## 2018-11-24 LAB — HIV ANTIBODY (ROUTINE TESTING W REFLEX): HIV Screen 4th Generation wRfx: NONREACTIVE

## 2018-11-24 MED ORDER — AZITHROMYCIN 250 MG PO TABS
1000.0000 mg | ORAL_TABLET | Freq: Once | ORAL | Status: AC
  Administered 2018-11-24: 1000 mg via ORAL
  Filled 2018-11-24: qty 4

## 2018-11-24 MED ORDER — CEFTRIAXONE SODIUM 250 MG IJ SOLR
250.0000 mg | Freq: Once | INTRAMUSCULAR | Status: AC
  Administered 2018-11-24: 250 mg via INTRAMUSCULAR
  Filled 2018-11-24: qty 250

## 2018-11-24 MED ORDER — METRONIDAZOLE 500 MG PO TABS: 500.0000 mg | ORAL_TABLET | Freq: Two times a day (BID) | ORAL | 0 refills | Status: DC

## 2018-11-24 MED ORDER — FLUCONAZOLE 150 MG PO TABS
150.0000 mg | ORAL_TABLET | Freq: Once | ORAL | Status: DC
  Filled 2018-11-24: qty 1

## 2018-11-24 MED ORDER — METRONIDAZOLE 500 MG PO TABS
500.0000 mg | ORAL_TABLET | Freq: Once | ORAL | Status: AC
  Administered 2018-11-24: 500 mg via ORAL
  Filled 2018-11-24: qty 1

## 2018-11-24 NOTE — Discharge Instructions (Signed)
[facility] [place] [facility]:  [facility] [address] [place], [place] [phone]  [facility] [address] [place], [place] [phone]  [facility] [address] [place], [place] [phone]   [facility] [address] [place], [place] [phone]  [facility] [address] [place], [place] [phone]   [facility] [address] [place], [place] [phone]   [facility] [address] [place], [place] [phone]   [facility] [address] , [place] [phone]   [facility] [address] , [place] [phone]   

## 2018-11-24 NOTE — ED Triage Notes (Signed)
C/o burning with urination x 1 1/2 weeks.

## 2018-11-24 NOTE — ED Provider Notes (Signed)
TIME SEEN: 5:40 AM  CHIEF COMPLAINT: Dysuria, urinary frequency  HPI: Patient is a 23 year old female with previous history of STDs, G3, P0 A3 who presents to the emergency department with complaints of 1 week of dysuria, urinary frequency.  States she was concerned she had an STD.  Has unprotected sex with one female partner.  Reports she has had vaginal discharge but states it is not abnormal for her.  Last menstrual cycle was 2 weeks ago.  No fevers, chills, nausea, vomiting.  ROS: See HPI Constitutional: no fever  Eyes: no drainage  ENT: no runny nose   Cardiovascular:  no chest pain  Resp: no SOB  GI: no vomiting GU:  dysuria Integumentary: no rash  Allergy: no hives  Musculoskeletal: no leg swelling  Neurological: no slurred speech ROS otherwise negative  PAST MEDICAL HISTORY/PAST SURGICAL HISTORY:  Past Medical History:  Diagnosis Date  . 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 Anxiety   . Asthma   . Chlamydia   . Depression    doing good  . Diabetes mellitus without complication (HCC)    Pre Diabetic  . Obesity   . Precocious puberty   . Prediabetes   . Trichomonal vaginitis     MEDICATIONS:  Prior to Admission medications   Not on File    ALLERGIES:  No Known Allergies  SOCIAL HISTORY:  Social History   Tobacco Use  . Smoking status: Never Smoker  . Smokeless tobacco: Never Used  Substance Use Topics  . Alcohol use: No    FAMILY HISTORY: Family History  Problem Relation Age of Onset  . Diabetes Mother   . Hypertension Mother   . Obesity Mother   . Fibroids Mother   . Cancer Mother        thyroid  . Obesity Father   . Obesity Maternal Aunt   . Fibroids Maternal Aunt   . Obesity Maternal Uncle   . Obesity Paternal Aunt   . Diabetes Maternal Grandmother   . Obesity Maternal Grandmother   . Hypertension Maternal Grandmother   . Diabetes Maternal Grandfather   . Obesity Maternal Grandfather   . Stroke Maternal  Grandfather   . Heart disease Maternal Grandfather   . Hypertension Maternal Grandfather   . Obesity Paternal Grandfather   . Heart disease Paternal Grandfather   . Bipolar disorder Brother   . Bipolar disorder Brother     EXAM: BP 120/81 (BP Location: Right Arm)   Pulse 85   Temp 99 F (37.2 C) (Oral)   Resp 16   LMP 11/09/2018   SpO2 100%  CONSTITUTIONAL: Alert and oriented and responds appropriately to questions. Well-appearing; well-nourished HEAD: Normocephalic EYES: Conjunctivae clear, pupils appear equal, EOMI ENT: normal nose; moist mucous membranes NECK: Supple, no meningismus, no nuchal rigidity, no LAD  CARD: RRR; S1 and S2 appreciated; no murmurs, no clicks, no rubs, no gallops RESP: Normal chest excursion without splinting or tachypnea; breath sounds clear and equal bilaterally; no wheezes, no rhonchi, no rales, no hypoxia or respiratory distress, speaking full sentences ABD/GI: Normal bowel sounds; non-distended; soft, non-tender, no rebound, no guarding, no peritoneal signs, no hepatosplenomegaly GU:  Normal external genitalia. No lesions, rashes noted. Patient has no vaginal bleeding on exam.  Patient has thin white foul-smelling vaginal discharge.  No adnexal tenderness, mass or fullness, no cervical motion tenderness. Cervix is not appear friable.  Cervix is closed.  Chaperone present for exam. BACK:  The  back appears normal and is non-tender to palpation, there is no CVA tenderness EXT: Normal ROM in all joints; non-tender to palpation; no edema; normal capillary refill; no cyanosis, no calf tenderness or swelling    SKIN: Normal color for age and race; warm; no rash NEURO: Moves all extremities equally PSYCH: The patient's mood and manner are appropriate. Grooming and personal hygiene are appropriate.  MEDICAL DECISION MAKING: Patient here with dysuria and urinary frequency.  She states she was concerned she could have an STD.  Her urine does not appear infected  and she is not pregnant.  Pelvic exam performed without cervical motion tenderness, adnexal tenderness.  Doubt ectopic, TOA, torsion, PID based on benign exam and negative pregnancy test.  She is requesting STD screening today and empiric treatment.  Will give ceftriaxone and azithromycin.  Wet prep pending.  Abdominal exam benign.  ED PROGRESS: Wet prep is positive for yeast and clue cells.  Will treat with Diflucan here and discharged with Flagyl.  HIV, syphilis, gonorrhea and Chlamydia testing pending.  She has been treated empirically with ceftriaxone and azithromycin.  She understands if she test positive for any STDs her partner will need to be tested and treated as well.  Will discharge home with outpatient follow-up information.   At this time, I do not feel there is any life-threatening condition present. I have reviewed and discussed all results (EKG, imaging, lab, urine as appropriate) and exam findings with patient/family. I have reviewed nursing notes and appropriate previous records.  I feel the patient is safe to be discharged home without further emergent workup and can continue workup as an outpatient as needed. Discussed usual and customary return precautions. Patient/family verbalize understanding and are comfortable with this plan.  Outpatient follow-up has been provided as needed. All questions have been answered.      Ward, Layla MawKristen N, DO 11/24/18 [phone]

## 2018-12-25 ENCOUNTER — Emergency Department (HOSPITAL_COMMUNITY)
Admission: EM | Admit: 2018-12-25 | Discharge: 2018-12-25 | Disposition: A | Discharge status: Home / Self Care | Payer: BLUE CROSS/BLUE SHIELD | Attending: Emergency Medicine | Admitting: Emergency Medicine

## 2018-12-25 ENCOUNTER — Other Ambulatory Visit: Payer: Self-pay

## 2018-12-25 ENCOUNTER — Encounter (HOSPITAL_COMMUNITY): Payer: Self-pay

## 2018-12-25 DIAGNOSIS — J45909 Unspecified asthma, uncomplicated: Secondary | ICD-10-CM | POA: Insufficient documentation

## 2018-12-25 DIAGNOSIS — R1030 Lower abdominal pain, unspecified: Secondary | ICD-10-CM | POA: Insufficient documentation

## 2018-12-25 DIAGNOSIS — K625 Hemorrhage of anus and rectum: Secondary | ICD-10-CM | POA: Diagnosis present

## 2018-12-25 DIAGNOSIS — K602 Anal fissure, unspecified: Secondary | ICD-10-CM | POA: Diagnosis not present

## 2018-12-25 MED ORDER — HYDROCORTISONE 2.5 % RE CREA: TOPICAL_CREAM | RECTAL | 1 refills | Status: DC

## 2018-12-25 MED ORDER — HYDROCORTISONE ACETATE 25 MG RE SUPP: 25.0000 mg | Freq: Two times a day (BID) | RECTAL | 0 refills | Status: DC

## 2018-12-25 NOTE — ED Provider Notes (Signed)
[facility]-EMERGENCY DEPT Provider Note   CSN: [id number] Arrival date & time: 12/25/18  0709    History   Chief Complaint Chief Complaint  Patient presents with  . Rectal Bleeding    HPI Jaime Ramos is a 23 y.o. female.     The history is provided by the patient.  Rectal Bleeding  Quality:  Bright red (blood is always bright red and never dark or with clots.  stool is a normal color) Amount:  Moderate Duration:  1 week Timing:  Intermittent Chronicity:  New Context: defecation and rectal pain   Pain details:    Quality:  Shooting and sharp   Severity:  Moderate   Duration:  1 week   Timing:  Worse with defecation   Progression:  Waxing and waning Similar prior episodes: no   Relieved by:  None tried Worsened by:  Defecation and wiping Ineffective treatments:  None tried Associated symptoms: no abdominal pain, no dizziness, no fever, no hematemesis, no light-headedness, no loss of consciousness, no recent illness and no vomiting   Associated symptoms comment:  Some right back pain but mostly rectal pain Risk factors: no anticoagulant use, no hx of colorectal cancer, no hx of IBD, no NSAID use and no steroid use     Past Medical History:  Diagnosis Date  . 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 Anxiety   . Asthma   . Chlamydia   . Depression    doing good  . Diabetes mellitus without complication (HCC)    Pre Diabetic  . Obesity   . Precocious puberty   . Prediabetes   . Trichomonal vaginitis     Patient Active Problem List   Diagnosis Date Noted  . Trichomoniasis 09/23/2018  . Abnormal Pap smear of cervix 12/20/2016  . MDD (major depressive disorder), recurrent episode, moderate (HCC) 01/25/2016  . Chlamydia 04/27/2015  . Nexplanon removal 02/09/2015  . Preterm delivery 10/10/2013  . Preterm labor 10/09/2013  . Depression 10/02/2013  . Vaginal bleeding before [redacted] weeks gestation 10/02/2013  .  Acanthosis 05/28/2012  . Menorrhagia with regular cycle 07/31/2011  . Goiter 07/31/2011  . Prediabetes   . Obesity 05/17/2011    Past Surgical History:  Procedure Laterality Date  . DILATION AND CURETTAGE OF UTERUS N/A 11/09/2014   Procedure: DILATATION AND CURETTAGE;  Surgeon: Allie Bossier, MD;  Location: [facility];  Service: Gynecology;  Laterality: N/A;  . DILATION AND CURETTAGE OF UTERUS  2016     OB History    Gravida  3   Para  1   Term      Preterm  1   AB  2   Living  0     SAB  1   TAB      Ectopic  1   Multiple      Live Births  1            Home Medications    Prior to Admission medications   Medication Sig Start Date End Date Taking? Authorizing Provider  aspirin-acetaminophen-caffeine (EXCEDRIN MIGRAINE) [phone] MG tablet Take 1 tablet by mouth every 6 (six) hours as needed for headache.   Yes [provider]  hydrocortisone (ANUSOL-HC) 2.5 % rectal cream Apply rectally 2 times daily 12/25/18   Gwyneth Sprout, MD  hydrocortisone (ANUSOL-HC) 25 MG suppository Place 1 suppository (25 mg total) rectally 2 (two) times daily. 12/25/18   Gwyneth Sprout, MD  metroNIDAZOLE (FLAGYL) 500 MG tablet Take 1 tablet (500 mg total) by mouth 2 (two) times daily. Patient not taking: Reported on 12/25/2018 11/24/18   Ward, Layla Maw, DO    Family History Family History  Problem Relation Age of Onset  . Diabetes Mother   . Hypertension Mother   . Obesity Mother   . Fibroids Mother   . Cancer Mother        thyroid  . Obesity Father   . Obesity Maternal Aunt   . Fibroids Maternal Aunt   . Obesity Maternal Uncle   . Obesity Paternal Aunt   . Diabetes Maternal Grandmother   . Obesity Maternal Grandmother   . Hypertension Maternal Grandmother   . Diabetes Maternal Grandfather   . Obesity Maternal Grandfather   . Stroke Maternal Grandfather   . Heart disease Maternal Grandfather   . Hypertension Maternal Grandfather   . Obesity Paternal  Grandfather   . Heart disease Paternal Grandfather   . Bipolar disorder Brother   . Bipolar disorder Brother     Social History Social History   Tobacco Use  . Smoking status: Never Smoker  . Smokeless tobacco: Never Used  Substance Use Topics  . Alcohol use: No  . Drug use: No     Allergies   Patient has no known allergies.   Review of Systems Review of Systems  Constitutional: Negative for fever.  Gastrointestinal: Positive for hematochezia. Negative for abdominal pain, hematemesis and vomiting.  Neurological: Negative for dizziness, loss of consciousness and light-headedness.  All other systems reviewed and are negative.    Physical Exam Updated Vital Signs BP 125/68 (BP Location: Right Arm)   Pulse 78   Temp 98.8 F (37.1 C) (Oral)   Resp 18   Ht 5\' 6"  (1.676 m)   Wt 120.2 kg   SpO2 100%   BMI 42.77 kg/m   Physical Exam Vitals signs and nursing note reviewed.  Constitutional:      General: She is not in acute distress.    Appearance: She is well-developed.  HENT:     Head: Normocephalic and atraumatic.  Eyes:     Pupils: Pupils are equal, round, and reactive to light.  Cardiovascular:     Rate and Rhythm: Normal rate and regular rhythm.     Heart sounds: Normal heart sounds. No murmur. No friction rub.  Pulmonary:     Effort: Pulmonary effort is normal.     Breath sounds: Normal breath sounds. No wheezing or rales.  Abdominal:     General: Bowel sounds are normal. There is no distension.     Palpations: Abdomen is soft.     Tenderness: There is abdominal tenderness in the suprapubic area. There is no guarding or rebound.     Comments: Mild suprapubic tenderness without rebound or guarding  Genitourinary:    Rectum: Tenderness and anal fissure present. No mass or external hemorrhoid.    Musculoskeletal: Normal range of motion.        General: No tenderness.     Comments: No edema  Skin:    General: Skin is warm and dry.     Capillary Refill:  Capillary refill takes less than 2 seconds.     Findings: No rash.  Neurological:     General: No focal deficit present.     Mental Status: She is alert and oriented to person, place, and time. Mental status is at baseline.     Cranial Nerves: No cranial nerve deficit.  Psychiatric:        Mood and Affect: Mood normal.        Behavior: Behavior normal.      ED Treatments / Results  Labs (all labs ordered are listed, but only abnormal results are displayed) Labs Reviewed - No data to display  EKG None  Radiology No results found.  Procedures Procedures (including critical care time)  Medications Ordered in ED Medications - No data to display   Initial Impression / Assessment and Plan / ED Course  I have reviewed the triage vital signs and the nursing notes.  Pertinent labs & imaging results that were available during my care of the patient were reviewed by me and considered in my medical decision making (see chart for details).        Healthy 23 year old female presenting today with intermittent bright red bleeding from rectum in the last week.  It is becoming worse in the last week.  Today she had a large amount of bright red blood.  It is always present with defecation and wiping.  She denies constipation, GI issues in the past or family history of ulcerative colitis or Crohn's disease.  She has not had fever, vomiting but does complain of rectal pain.  On exam patient has a deep rectal fissure present which is most likely the cause of her pain and bleeding.  There are no hemorrhoids present at this time.  Patient was instructed to use Anusol cream and suppositories as well as doing sitz baths.  She was instructed to follow-up with her PCP in 1 week if symptoms are not improving for GI referral.  Final Clinical Impressions(s) / ED Diagnoses   Final diagnoses:  Anal fissure    ED Discharge Orders         Ordered    hydrocortisone (ANUSOL-HC) 2.5 % rectal cream      12/25/18 0818    hydrocortisone (ANUSOL-HC) 25 MG suppository  2 times daily     12/25/18 0818           Gwyneth SproutPlunkett, Demaria Deeney, MD 12/25/18 [phone]

## 2018-12-25 NOTE — ED Triage Notes (Signed)
Pt arrives POV from home. Pt reports bright red rectal bleeding. Pt reports that the bleeding has been on and off for 1 week. Pt reports that there was a large amount of bright red blood last night. Pt reports some rectal pain after BM. Pt denies constipation but endorses diarrhea. Pt denies hemorrhoids.

## 2018-12-25 NOTE — Discharge Instructions (Signed)
Do warm soaks in the tub 1-2 times a day for 20 min.  Make sure you are using stool softeners or eating a lot a fruit and juice to keep your stool soft.  If you develop a fever, vomiting, severe worsening of your pain please return

## 2019-01-19 ENCOUNTER — Emergency Department (HOSPITAL_COMMUNITY): Payer: BLUE CROSS/BLUE SHIELD

## 2019-01-19 ENCOUNTER — Other Ambulatory Visit: Payer: Self-pay

## 2019-01-19 ENCOUNTER — Emergency Department (HOSPITAL_COMMUNITY)
Admission: EM | Admit: 2019-01-19 | Discharge: 2019-01-20 | Disposition: A | Discharge status: Home / Self Care | Payer: BLUE CROSS/BLUE SHIELD | Attending: Emergency Medicine | Admitting: Emergency Medicine

## 2019-01-19 ENCOUNTER — Encounter (HOSPITAL_COMMUNITY): Payer: Self-pay

## 2019-01-19 DIAGNOSIS — K625 Hemorrhage of anus and rectum: Secondary | ICD-10-CM | POA: Diagnosis present

## 2019-01-19 DIAGNOSIS — R1031 Right lower quadrant pain: Secondary | ICD-10-CM | POA: Diagnosis not present

## 2019-01-19 DIAGNOSIS — E119 Type 2 diabetes mellitus without complications: Secondary | ICD-10-CM | POA: Insufficient documentation

## 2019-01-19 DIAGNOSIS — R059 Cough, unspecified: Secondary | ICD-10-CM

## 2019-01-19 DIAGNOSIS — R05 Cough: Secondary | ICD-10-CM

## 2019-01-19 DIAGNOSIS — J45909 Unspecified asthma, uncomplicated: Secondary | ICD-10-CM | POA: Diagnosis not present

## 2019-01-19 LAB — I-STAT BETA HCG BLOOD, ED (MC, WL, AP ONLY): I-stat hCG, quantitative: <5 m[IU]/mL (ref <5)

## 2019-01-19 LAB — COMPREHENSIVE METABOLIC PANEL
ALT: 18 U/L (ref 0–44)
AST: 19 U/L (ref 15–41)
Albumin: 3.4 g/dL — ABNORMAL LOW (ref 3.5–5.0)
Alkaline Phosphatase: 77 U/L (ref 38–126)
Anion gap: 5 (ref 5–15)
BUN: 9 mg/dL (ref 6–20)
CALCIUM: 8.2 mg/dL — AB (ref 8.9–10.3)
CO2: 24 mmol/L (ref 22–32)
Chloride: 107 mmol/L (ref 98–111)
Creatinine, Ser: 0.56 mg/dL (ref 0.44–1.00)
GFR calc Af Amer: >60 mL/min (ref >60)
GFR calc non Af Amer: >60 mL/min (ref >60)
Glucose, Bld: 128 mg/dL — ABNORMAL HIGH (ref 70–99)
Potassium: 3.5 mmol/L (ref 3.5–5.1)
Sodium: 136 mmol/L (ref 135–145)
Total Bilirubin: 0.3 mg/dL (ref 0.3–1.2)
Total Protein: 7.2 g/dL (ref 6.5–8.1)

## 2019-01-19 LAB — CBC
HCT: 34.9 % — ABNORMAL LOW (ref 36.0–46.0)
Hemoglobin: 10.1 g/dL — ABNORMAL LOW (ref 12.0–15.0)
MCH: 22.5 pg — ABNORMAL LOW (ref 26.0–34.0)
MCHC: 28.9 g/dL — AB (ref 30.0–36.0)
MCV: 77.9 fL — ABNORMAL LOW (ref 80.0–100.0)
Platelets: 421 10*3/uL — ABNORMAL HIGH (ref 150–400)
RBC: 4.48 MIL/uL (ref 3.87–5.11)
RDW: 16 % — ABNORMAL HIGH (ref 11.5–15.5)
WBC: 9 10*3/uL (ref 4.0–10.5)
nRBC: 0 % (ref 0.0–0.2)

## 2019-01-19 LAB — TYPE AND SCREEN
ABO/RH(D): O POS
Antibody Screen: NEGATIVE

## 2019-01-19 LAB — ABO/RH: ABO/RH(D): O POS

## 2019-01-19 LAB — POC OCCULT BLOOD, ED: Fecal Occult Bld: NEGATIVE

## 2019-01-19 MED ORDER — IOPAMIDOL (ISOVUE-300) INJECTION 61%
INTRAVENOUS | Status: AC
  Filled 2019-01-19: qty 100

## 2019-01-19 MED ORDER — IOPAMIDOL (ISOVUE-300) INJECTION 61%
100.0000 mL | Freq: Once | INTRAVENOUS | Status: AC | PRN
  Administered 2019-01-19: 100 mL via INTRAVENOUS

## 2019-01-19 MED ORDER — SODIUM CHLORIDE (PF) 0.9 % IJ SOLN
INTRAMUSCULAR | Status: AC
  Filled 2019-01-19: qty 50

## 2019-01-19 NOTE — ED Provider Notes (Signed)
Emergency Department Provider Note   I have reviewed the triage vital signs and the nursing notes.   HISTORY  Chief Complaint Rectal Bleeding and Abdominal Pain   HPI Jaime Ramos is a 23 y.o. female with recent diagnosis of anal fissure presents to the emergency department with bright red blood per rectum.  Patient reported bright red blood with wiping today.  She passed what appeared to be some clot.  Patient also reports right lower quadrant abdominal discomfort.  She denies any severe pain.  No radiation of symptoms or other modifying factors.  No diarrhea or vomiting.  She used the Anusol and feels like her fissure is much improved.  She is not having rectal pain with the bleeding.  She is sure that this is not coming from the vagina or in the urine.  No fevers or shaking chills.  Past Medical History:  Diagnosis Date  . 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 Anxiety   . Asthma   . Chlamydia   . Depression    doing good  . Diabetes mellitus without complication (HCC)    Pre Diabetic  . Obesity   . Precocious puberty   . Prediabetes   . Trichomonal vaginitis     Patient Active Problem List   Diagnosis Date Noted  . Trichomoniasis 09/23/2018  . Abnormal Pap smear of cervix 12/20/2016  . MDD (major depressive disorder), recurrent episode, moderate (HCC) 01/25/2016  . Chlamydia 04/27/2015  . Nexplanon removal 02/09/2015  . Preterm delivery 10/10/2013  . Preterm labor 10/09/2013  . Depression 10/02/2013  . Vaginal bleeding before [redacted] weeks gestation 10/02/2013  . Acanthosis 05/28/2012  . Menorrhagia with regular cycle 07/31/2011  . Goiter 07/31/2011  . Prediabetes   . Obesity 05/17/2011    Past Surgical History:  Procedure Laterality Date  . DILATION AND CURETTAGE OF UTERUS N/A 11/09/2014   Procedure: DILATATION AND CURETTAGE;  Surgeon: Allie Bossier, MD;  Location: [facility];  Service: Gynecology;  Laterality: N/A;  . DILATION AND  CURETTAGE OF UTERUS  2016   Allergies Patient has no known allergies.  Family History  Problem Relation Age of Onset  . Diabetes Mother   . Hypertension Mother   . Obesity Mother   . Fibroids Mother   . Cancer Mother        thyroid  . Obesity Father   . Obesity Maternal Aunt   . Fibroids Maternal Aunt   . Obesity Maternal Uncle   . Obesity Paternal Aunt   . Diabetes Maternal Grandmother   . Obesity Maternal Grandmother   . Hypertension Maternal Grandmother   . Diabetes Maternal Grandfather   . Obesity Maternal Grandfather   . Stroke Maternal Grandfather   . Heart disease Maternal Grandfather   . Hypertension Maternal Grandfather   . Obesity Paternal Grandfather   . Heart disease Paternal Grandfather   . Bipolar disorder Brother   . Bipolar disorder Brother     Social History Social History   Tobacco Use  . Smoking status: Never Smoker  . Smokeless tobacco: Never Used  Substance Use Topics  . Alcohol use: No  . Drug use: No    Review of Systems  Constitutional: No fever/chills Eyes: No visual changes. ENT: No sore throat. Cardiovascular: Denies chest pain. Respiratory: Denies shortness of breath. Gastrointestinal: Positive RLQ abdominal pain.  No nausea, no vomiting.  No diarrhea.  No constipation. BRBPR.  Genitourinary: Negative for dysuria. Musculoskeletal: Negative  for back pain. Skin: Negative for rash. Neurological: Negative for headaches, focal weakness or numbness.  10-point ROS otherwise negative.  ____________________________________________   PHYSICAL EXAM:  VITAL SIGNS: ED Triage Vitals  Enc Vitals Group     BP 01/19/19 1911 137/76     Pulse Rate 01/19/19 1911 85     Resp 01/19/19 2135 18     Temp 01/19/19 1911 99.4 F (37.4 C)     Temp Source 01/19/19 1911 Oral     SpO2 01/19/19 1911 99 %     Weight 01/19/19 1916 264 lb 15.9 oz (120.2 kg)     Height 01/19/19 1916 5\' 6"  (1.676 m)     Pain Score 01/19/19 1915 7   Constitutional:  Alert and oriented. Well appearing and in no acute distress. Eyes: Conjunctivae are normal. Head: Atraumatic. Nose: No congestion/rhinnorhea. Mouth/Throat: Mucous membranes are moist.  Neck: No stridor.  Cardiovascular: Normal rate, regular rhythm. Good peripheral circulation. Grossly normal heart sounds.   Respiratory: Normal respiratory effort.  No retractions. Lungs CTAB. Gastrointestinal: Soft with mild RLQ tenderness. No distention. Rectal exam performed with nurse tech chaperone and after verbal consent. No fissure appreciate. No hemorrhoids. No BRB on exam finger with DRE. No pain on exam.  Musculoskeletal: No lower extremity tenderness nor edema. No gross deformities of extremities. Neurologic:  Normal speech and language. No gross focal neurologic deficits are appreciated.  Skin:  Skin is warm, dry and intact. No rash noted.  ____________________________________________   LABS (all labs ordered are listed, but only abnormal results are displayed)  Labs Reviewed  COMPREHENSIVE METABOLIC PANEL - Abnormal; Notable for the following components:      Result Value   Glucose, Bld 128 (*)    Calcium 8.2 (*)    Albumin 3.4 (*)    All other components within normal limits  CBC - Abnormal; Notable for the following components:   Hemoglobin 10.1 (*)    HCT 34.9 (*)    MCV 77.9 (*)    MCH 22.5 (*)    MCHC 28.9 (*)    RDW 16.0 (*)    Platelets 421 (*)    All other components within normal limits  POC OCCULT BLOOD, ED  I-STAT BETA HCG BLOOD, ED ([facility], [facility], [facility] ONLY)  TYPE AND SCREEN  ABO/RH   ____________________________________________  RADIOLOGY  Ct Abdomen Pelvis W Contrast  Result Date: 01/19/2019 CLINICAL DATA:  Right upper quadrant pain and rectal bleeding today. EXAM: CT ABDOMEN AND PELVIS WITH CONTRAST TECHNIQUE: Multidetector CT imaging of the abdomen and pelvis was performed using the standard protocol following bolus administration of intravenous contrast. CONTRAST:   ISOVUE-300 IOPAMIDOL (ISOVUE-300) INJECTION 61% COMPARISON:  None. FINDINGS: Lower chest: No acute abnormality. Hepatobiliary: No focal liver abnormality is seen. No gallstones, gallbladder wall thickening, or biliary dilatation. Pancreas: Unremarkable. No pancreatic ductal dilatation or surrounding inflammatory changes. Spleen: Normal in size without focal abnormality. Adrenals/Urinary Tract: No adrenal masses. Subcentimeter low-density right renal lesions, most likely cysts. No other renal masses or lesions, no stones and no hydronephrosis. Ureters are normal in course and in caliber. Bladder is unremarkable. Stomach/Bowel: Stomach is within normal limits. Small bowel is normal in caliber. No wall thickening or inflammation. Left colon is mostly decompressed. No colonic wall thickening or inflammation. No definite diverticula. No findings to account for rectal bleeding. Normal appendix visualized. Vascular/Lymphatic: No significant vascular findings are present. No enlarged abdominal or pelvic lymph nodes. Reproductive: Uterus and bilateral adnexa are unremarkable. Other: No abdominal  wall hernia or abnormality. No abdominopelvic ascites. Musculoskeletal: No acute or significant osseous findings. IMPRESSION: 1. No acute findings within the abdomen or pelvis. 2. Normal appearance of the bowel. No findings to account for GI bleeding. No abnormality to account for right upper quadrant abdominal pain. 3. Small low-density right renal lesions. These are too small to fully characterize but are likely cysts. No other abnormalities. Electronically Signed   By: Amie Portland M.D.   On: 01/19/2019 23:54    ____________________________________________   PROCEDURES  Procedure(s) performed:   Procedures  None  ____________________________________________   INITIAL IMPRESSION / ASSESSMENT AND PLAN / ED COURSE  Pertinent labs & imaging results that were available during my care of the patient were  reviewed by me and considered in my medical decision making (see chart for details).  Patient presents to the emergency department with concern for bright red blood per rectum.  Lab work reviewed with no significant anemia or leukocytosis.  Patient does have mild tenderness in the right lower quadrant tenderness.  CT imaging with no acute findings.  Lab work is normal.  Patient describes a mild cough and was provided [employer].  Patient is low risk for flu.  No fever.  No COVID exposure.  No further testing or imaging.  Sats are 100% and lungs are clear. Discussed PCP and possible GI follow up if symptoms continue.    ____________________________________________  FINAL CLINICAL IMPRESSION(S) / ED DIAGNOSES  Final diagnoses:  Rectal bleeding  RLQ abdominal pain  Cough     MEDICATIONS GIVEN DURING THIS VISIT:  Medications  iopamidol (ISOVUE-300) 61 % injection (has no administration in time range)  sodium chloride (PF) 0.9 % injection (has no administration in time range)  iopamidol (ISOVUE-300) 61 % injection 100 mL (100 mLs Intravenous Contrast Given 01/19/19 2335)     NEW OUTPATIENT MEDICATIONS STARTED DURING THIS VISIT:  New Prescriptions   BENZONATATE (TESSALON) 100 MG CAPSULE    Take 1 capsule (100 mg total) by mouth 3 (three) times daily as needed for cough.    Note:  This document was prepared using Dragon voice recognition software and may include unintentional dictation errors.  Alona Bene, MD Emergency Medicine    Angelea Penny, Arlyss Repress, MD 01/20/19 [phone]

## 2019-01-19 NOTE — Discharge Instructions (Signed)

## 2019-01-19 NOTE — ED Triage Notes (Signed)
Patient states she had a recent fissure that has healed. Patient states today, she had bright red blood,but blood clots were dark red. Patient c/o right lower abdominal pain.  Patient also reports a productive cough with yellow sputum that is blood streaked x 2 weeks.

## 2019-01-20 MED ORDER — BENZONATATE 100 MG PO CAPS: 100.0000 mg | ORAL_CAPSULE | Freq: Three times a day (TID) | ORAL | Status: DC | PRN

## 2019-01-20 MED ORDER — BENZONATATE 100 MG PO CAPS: 100.0000 mg | ORAL_CAPSULE | Freq: Three times a day (TID) | ORAL | 0 refills | Status: DC | PRN

## 2019-04-27 ENCOUNTER — Other Ambulatory Visit: Payer: Self-pay

## 2019-04-27 DIAGNOSIS — Z20822 Contact with and (suspected) exposure to covid-19: Secondary | ICD-10-CM

## 2019-04-30 LAB — NOVEL CORONAVIRUS, NAA: SARS-CoV-2, NAA: NOT DETECTED

## 2019-05-12 ENCOUNTER — Other Ambulatory Visit: Payer: Self-pay

## 2019-05-12 ENCOUNTER — Emergency Department (HOSPITAL_COMMUNITY)
Admission: EM | Admit: 2019-05-12 | Discharge: 2019-05-12 | Disposition: A | Discharge status: Home / Self Care | Payer: BC Managed Care – PPO | Attending: Emergency Medicine | Admitting: Emergency Medicine

## 2019-05-12 ENCOUNTER — Encounter (HOSPITAL_COMMUNITY): Payer: Self-pay

## 2019-05-12 DIAGNOSIS — A599 Trichomoniasis, unspecified: Secondary | ICD-10-CM | POA: Diagnosis not present

## 2019-05-12 DIAGNOSIS — E119 Type 2 diabetes mellitus without complications: Secondary | ICD-10-CM | POA: Diagnosis not present

## 2019-05-12 DIAGNOSIS — J45909 Unspecified asthma, uncomplicated: Secondary | ICD-10-CM | POA: Diagnosis not present

## 2019-05-12 DIAGNOSIS — Z202 Contact with and (suspected) exposure to infections with a predominantly sexual mode of transmission: Secondary | ICD-10-CM | POA: Insufficient documentation

## 2019-05-12 DIAGNOSIS — N898 Other specified noninflammatory disorders of vagina: Secondary | ICD-10-CM | POA: Diagnosis present

## 2019-05-12 LAB — WET PREP, GENITAL
Clue Cells Wet Prep HPF POC: NONE SEEN
Sperm: NONE SEEN
Yeast Wet Prep HPF POC: NONE SEEN

## 2019-05-12 MED ORDER — AZITHROMYCIN 250 MG PO TABS
1000.0000 mg | ORAL_TABLET | Freq: Once | ORAL | Status: AC
  Administered 2019-05-12: 1000 mg via ORAL
  Filled 2019-05-12: qty 4

## 2019-05-12 MED ORDER — METRONIDAZOLE 500 MG PO TABS
2000.0000 mg | ORAL_TABLET | Freq: Once | ORAL | Status: AC
  Administered 2019-05-12: 2000 mg via ORAL
  Filled 2019-05-12: qty 4

## 2019-05-12 MED ORDER — CEFTRIAXONE SODIUM 250 MG IJ SOLR
250.0000 mg | Freq: Once | INTRAMUSCULAR | Status: AC
  Administered 2019-05-12: 250 mg via INTRAMUSCULAR
  Filled 2019-05-12: qty 250

## 2019-05-12 MED ORDER — STERILE WATER FOR INJECTION IJ SOLN
INTRAMUSCULAR | Status: AC
  Administered 2019-05-12: 1.2 mL
  Filled 2019-05-12: qty 10

## 2019-05-12 NOTE — ED Triage Notes (Signed)
Pt reports that a sexual partner called her and said that he was dx'd with trich and chlamydia. She reports burning with urination. Denies discharge. Pt is on her period.

## 2019-05-12 NOTE — ED Provider Notes (Signed)
[facility]-EMERGENCY DEPT Provider Note   CSN: [id number] Arrival date & time: 05/12/19  0602     History   Chief Complaint Chief Complaint  Patient presents with  . Exposure to STD    HPI Jaime Ramos is a 23 y.o. female.     The history is provided by the patient and medical records.  Exposure to STD     23 year old female with history of anxiety, depression, diabetes, presenting to the ED after exposure to STD.  States she was notified 2 days ago that partner tested positive for chlamydia and trichomonas but she was not able to get evaluated until today due to work.  She is not having any vaginal discharge.  She does report somewhat of a burning sensation in her genital region.  She has history of STD in the past with similar symptoms.  States they were using protection but during last encounter the condom did break.  States partner was fully treated.  Patient is currently at end of menstrual cycle.  Past Medical History:  Diagnosis Date  . 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 Anxiety   . Asthma   . Chlamydia   . Depression    doing good  . Diabetes mellitus without complication (HCC)    Pre Diabetic  . Obesity   . Precocious puberty   . Prediabetes   . Trichomonal vaginitis     Patient Active Problem List   Diagnosis Date Noted  . Trichomoniasis 09/23/2018  . Abnormal Pap smear of cervix 12/20/2016  . MDD (major depressive disorder), recurrent episode, moderate (HCC) 01/25/2016  . Chlamydia 04/27/2015  . Nexplanon removal 02/09/2015  . Preterm delivery 10/10/2013  . Preterm labor 10/09/2013  . Depression 10/02/2013  . Vaginal bleeding before [redacted] weeks gestation 10/02/2013  . Acanthosis 05/28/2012  . Menorrhagia with regular cycle 07/31/2011  . Goiter 07/31/2011  . Prediabetes   . Obesity 05/17/2011    Past Surgical History:  Procedure Laterality Date  . DILATION AND CURETTAGE OF UTERUS N/A 11/09/2014   Procedure: DILATATION AND CURETTAGE;  Surgeon: [name], MD;  Location: [facility];  Service: Gynecology;  Laterality: N/A;  . DILATION AND CURETTAGE OF UTERUS  2016     OB History    Gravida  3   Para  1   Term      Preterm  1   AB  2   Living  0     SAB  1   TAB      Ectopic  1   Multiple      Live Births  1            Home Medications    Prior to Admission medications   Medication Sig Start Date End Date Taking? Authorizing Provider  aspirin-acetaminophen-caffeine (EXCEDRIN MIGRAINE) [id number] MG tablet Take 1 tablet by mouth every 6 (six) hours as needed for headache.    [provider]  benzonatate (TESSALON) 100 MG capsule Take 1 capsule (100 mg total) by mouth 3 (three) times daily as needed for cough. 01/20/19   Long, Arlyss RepressJoshua G, MD  hydrocortisone (ANUSOL-HC) 2.5 % rectal cream Apply rectally 2 times daily Patient not taking: Reported on 01/19/2019 12/25/18   Gwyneth SproutPlunkett, Whitney, MD  hydrocortisone (ANUSOL-HC) 25 MG suppository Place 1 suppository (25 mg total) rectally 2 (two) times daily. Patient not taking: Reported on 01/19/2019 12/25/18   Gwyneth SproutPlunkett, Whitney, MD  metroNIDAZOLE (FLAGYL) 500  MG tablet Take 1 tablet (500 mg total) by mouth 2 (two) times daily. Patient not taking: Reported on 12/25/2018 11/24/18   Ward, Layla MawKristen N, DO    Family History Family History  Problem Relation Age of Onset  . Diabetes Mother   . Hypertension Mother   . Obesity Mother   . Fibroids Mother   . Cancer Mother        thyroid  . Obesity Father   . Obesity Maternal Aunt   . Fibroids Maternal Aunt   . Obesity Maternal Uncle   . Obesity Paternal Aunt   . Diabetes Maternal Grandmother   . Obesity Maternal Grandmother   . Hypertension Maternal Grandmother   . Diabetes Maternal Grandfather   . Obesity Maternal Grandfather   . Stroke Maternal Grandfather   . Heart disease Maternal Grandfather   . Hypertension Maternal Grandfather   . Obesity Paternal Grandfather    . Heart disease Paternal Grandfather   . Bipolar disorder Brother   . Bipolar disorder Brother     Social History Social History   Tobacco Use  . Smoking status: Never Smoker  . Smokeless tobacco: Never Used  Substance Use Topics  . Alcohol use: No  . Drug use: No     Allergies   Patient has no known allergies.   Review of Systems Review of Systems  Genitourinary:       STD exposure  All other systems reviewed and are negative.    Physical Exam Updated Vital Signs BP 128/62 (BP Location: Right Arm)   Pulse 75   Temp 98.1 F (36.7 C) (Oral)   Resp 16   SpO2 100%   Physical Exam Vitals signs and nursing note reviewed.  Constitutional:      Appearance: She is well-developed.  HENT:     Head: Normocephalic and atraumatic.  Eyes:     Conjunctiva/sclera: Conjunctivae normal.     Pupils: Pupils are equal, round, and reactive to light.  Neck:     Musculoskeletal: Normal range of motion.  Cardiovascular:     Rate and Rhythm: Normal rate and regular rhythm.     Heart sounds: Normal heart sounds.  Pulmonary:     Effort: Pulmonary effort is normal.     Breath sounds: Normal breath sounds.  Abdominal:     General: Bowel sounds are normal.     Palpations: Abdomen is soft.  Genitourinary:    Comments: Exam chaperoned by NT Normal female external genitalia without visible lesions or rash; no significant discharged noted, scant amount of blood in vaginal vault (end of menses per patient); no adnexal or CMT Musculoskeletal: Normal range of motion.  Skin:    General: Skin is warm and dry.  Neurological:     Mental Status: She is alert and oriented to person, place, and time.      ED Treatments / Results  Labs (all labs ordered are listed, but only abnormal results are displayed) Labs Reviewed  WET PREP, GENITAL  GC[facility]CHLAMYDIA PROBE AMP [facility]    EKG None  Radiology No results found.  Procedures Procedures (including critical care  time)  Medications Ordered in ED Medications  sterile water (preservative free) injection (has no administration in time range)  metroNIDAZOLE (FLAGYL) tablet 2,000 mg (2,000 mg Oral Given 05/12/19 0652)  cefTRIAXone (ROCEPHIN) injection 250 mg (250 mg Intramuscular Given 05/12/19 0652)  azithromycin (ZITHROMAX) tablet 1,000 mg (1,000 mg Oral Given 05/12/19 [id number])     Initial Impression /  Assessment and Plan / ED Course  I have reviewed the triage vital signs and the nursing notes.  Pertinent labs & imaging results that were available during my care of the patient were reviewed by me and considered in my medical decision making (see chart for details).  23 year old female here after exposure to STD.  She denies any discharge but has had some genital burning.  She reports history of STD in the past with similar symptoms.  She is not having any current pelvic or abdominal pain.  Pelvic exam was performed here, no significant discharge.  Small amount of bleeding but she is at the end of her menstrual cycle.  She does not have any adnexal or cervical motion tenderness.  She will be treated empirically here with Rocephin, azithromycin, and Flagyl.  Wet prep is positive for trich.  Gc/chl pending.  Advised she will be notified of any abnormal results but she has already been treated.  Advised to abstain from sexual activity for at least 1 week.  Can follow-up with PCP and/or health dept.  Return here for any new/acute changes.  Final Clinical Impressions(s) / ED Diagnoses   Final diagnoses:  STD exposure  Trichomonas infection    ED Discharge Orders    None       Larene Pickett, PA-C 05/12/19 0658    Ward, Delice Bison, DO 05/12/19 [phone]

## 2019-05-12 NOTE — Discharge Instructions (Signed)
You will be notified if your culture results are positive but you have already been treated. Abstain from sexual activity for at least 1 week to allow medications to take effect. Follow-up with your primary care doctor. Return here for any new or acute changes.

## 2019-05-13 LAB — GC/CHLAMYDIA PROBE AMP (~~LOC~~) NOT AT ARMC
Chlamydia: NEGATIVE
Neisseria Gonorrhea: POSITIVE — AB

## 2019-06-12 ENCOUNTER — Emergency Department (HOSPITAL_COMMUNITY)
Admission: EM | Admit: 2019-06-12 | Discharge: 2019-06-13 | Disposition: A | Discharge status: Home / Self Care | Payer: BC Managed Care – PPO | Attending: Emergency Medicine | Admitting: Emergency Medicine

## 2019-06-12 ENCOUNTER — Other Ambulatory Visit: Payer: Self-pay

## 2019-06-12 DIAGNOSIS — R7303 Prediabetes: Secondary | ICD-10-CM | POA: Insufficient documentation

## 2019-06-12 DIAGNOSIS — N898 Other specified noninflammatory disorders of vagina: Secondary | ICD-10-CM | POA: Insufficient documentation

## 2019-06-12 DIAGNOSIS — R300 Dysuria: Secondary | ICD-10-CM | POA: Insufficient documentation

## 2019-06-12 NOTE — ED Triage Notes (Addendum)
Pt reports dysuria since Monday. She states that it burns after she finishes urinating. Denies hematuria. Also endorses white vaginal discharge.

## 2019-06-13 LAB — WET PREP, GENITAL
Clue Cells Wet Prep HPF POC: NONE SEEN
Sperm: NONE SEEN
Trich, Wet Prep: NONE SEEN
Yeast Wet Prep HPF POC: NONE SEEN

## 2019-06-13 LAB — URINALYSIS, ROUTINE W REFLEX MICROSCOPIC
Bilirubin Urine: NEGATIVE
Glucose, UA: NEGATIVE mg/dL
Hgb urine dipstick: NEGATIVE
Ketones, ur: NEGATIVE mg/dL
Leukocytes,Ua: NEGATIVE
Nitrite: NEGATIVE
Protein, ur: NEGATIVE mg/dL
Specific Gravity, Urine: 1.021 (ref 1.005–1.030)
pH: 6 (ref 5.0–8.0)

## 2019-06-13 LAB — POC URINE PREG, ED: Preg Test, Ur: NEGATIVE

## 2019-06-13 NOTE — ED Provider Notes (Signed)
[facility]-EMERGENCY DEPT Provider Note  CSN: [id number] Arrival date & time: 06/12/19 2221  Chief Complaint(s) Dysuria  HPI Oralia ManisMichaela Kavanagh is a 23 y.o. female   The history is provided by the patient.  Dysuria Pain quality:  Burning Pain severity:  Mild Onset quality:  Gradual Duration:  5 days Timing:  Constant Progression:  Worsening Chronicity:  Recurrent Recent urinary tract infections: no (similar to recent Trich infection)   Relieved by:  Nothing Worsened by:  Nothing Associated symptoms: vaginal discharge   Associated symptoms: no abdominal pain, no fever, no genital lesions and no nausea    Has not been sexually active since last treated.  Past Medical History Past Medical History:  Diagnosis Date  . 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 Anxiety   . Asthma   . Chlamydia   . Depression    doing good  . Diabetes mellitus without complication (HCC)    Pre Diabetic  . Obesity   . Precocious puberty   . Prediabetes   . Trichomonal vaginitis    Patient Active Problem List   Diagnosis Date Noted  . Trichomoniasis 09/23/2018  . Abnormal Pap smear of cervix 12/20/2016  . MDD (major depressive disorder), recurrent episode, moderate (HCC) 01/25/2016  . Chlamydia 04/27/2015  . Nexplanon removal 02/09/2015  . Preterm delivery 10/10/2013  . Preterm labor 10/09/2013  . Depression 10/02/2013  . Vaginal bleeding before [redacted] weeks gestation 10/02/2013  . Acanthosis 05/28/2012  . Menorrhagia with regular cycle 07/31/2011  . Goiter 07/31/2011  . Prediabetes   . Obesity 05/17/2011   Home Medication(s) Prior to Admission medications   Medication Sig Start Date End Date Taking? Authorizing Provider  benzonatate (TESSALON) 100 MG capsule Take 1 capsule (100 mg total) by mouth 3 (three) times daily as needed for cough. Patient not taking: Reported on 06/13/2019 01/20/19   Long, Arlyss RepressJoshua G, MD  hydrocortisone (ANUSOL-HC) 2.5 %  rectal cream Apply rectally 2 times daily Patient not taking: Reported on 01/19/2019 12/25/18   Gwyneth SproutPlunkett, Whitney, MD  hydrocortisone (ANUSOL-HC) 25 MG suppository Place 1 suppository (25 mg total) rectally 2 (two) times daily. Patient not taking: Reported on 01/19/2019 12/25/18   Gwyneth SproutPlunkett, Whitney, MD  metroNIDAZOLE (FLAGYL) 500 MG tablet Take 1 tablet (500 mg total) by mouth 2 (two) times daily. Patient not taking: Reported on 12/25/2018 11/24/18   Ward, Layla MawKristen N, DO                                                                                                                                    Past Surgical History Past Surgical History:  Procedure Laterality Date  . DILATION AND CURETTAGE OF UTERUS N/A 11/09/2014   Procedure: DILATATION AND CURETTAGE;  Surgeon: [name], MD;  Location: [facility];  Service: Gynecology;  Laterality: N/A;  . DILATION AND CURETTAGE OF UTERUS  2016  Family History Family History  Problem Relation Age of Onset  . Diabetes Mother   . Hypertension Mother   . Obesity Mother   . Fibroids Mother   . Cancer Mother        thyroid  . Obesity Father   . Obesity Maternal Aunt   . Fibroids Maternal Aunt   . Obesity Maternal Uncle   . Obesity Paternal Aunt   . Diabetes Maternal Grandmother   . Obesity Maternal Grandmother   . Hypertension Maternal Grandmother   . Diabetes Maternal Grandfather   . Obesity Maternal Grandfather   . Stroke Maternal Grandfather   . Heart disease Maternal Grandfather   . Hypertension Maternal Grandfather   . Obesity Paternal Grandfather   . Heart disease Paternal Grandfather   . Bipolar disorder Brother   . Bipolar disorder Brother     Social History Social History   Tobacco Use  . Smoking status: Never Smoker  . Smokeless tobacco: Never Used  Substance Use Topics  . Alcohol use: No  . Drug use: No   Allergies Patient has no known allergies.  Review of Systems Review of Systems  Constitutional: Negative for fever.   Gastrointestinal: Negative for abdominal pain and nausea.  Genitourinary: Positive for dysuria and vaginal discharge.   All other systems are reviewed and are negative for acute change except as noted in the HPI  Physical Exam Vital Signs  I have reviewed the triage vital signs BP (!) 97/59 (BP Location: Left Arm)   Pulse 68   Temp 97.6 F (36.4 C) (Oral)   Resp 16   SpO2 100%   Physical Exam Vitals signs reviewed. Exam conducted with a chaperone present.  Constitutional:      General: She is not in acute distress.    Appearance: She is well-developed. She is obese. She is not diaphoretic.  HENT:     Head: Normocephalic and atraumatic.     Right Ear: External ear normal.     Left Ear: External ear normal.     Nose: Nose normal.  Eyes:     General: No scleral icterus.    Conjunctiva/sclera: Conjunctivae normal.  Neck:     Musculoskeletal: Normal range of motion.     Trachea: Phonation normal.  Cardiovascular:     Rate and Rhythm: Normal rate and regular rhythm.  Pulmonary:     Effort: Pulmonary effort is normal. No respiratory distress.     Breath sounds: No stridor.  Abdominal:     General: There is no distension.  Genitourinary:    Labia:        Right: No rash, tenderness or lesion.        Left: No rash, tenderness or lesion.      Vagina: No signs of injury. Vaginal discharge (white scant) present. No erythema, tenderness or bleeding.     Cervix: No cervical motion tenderness, discharge, friability, lesion or erythema.     Uterus: Not tender.      Adnexa:        Right: No tenderness.         Left: No tenderness.    Musculoskeletal: Normal range of motion.  Neurological:     Mental Status: She is alert and oriented to person, place, and time.  Psychiatric:        Behavior: Behavior normal.     ED Results and Treatments Labs (all labs ordered are listed, but only abnormal results are displayed) Labs Reviewed  WET PREP, GENITAL -  Abnormal; Notable for the  following components:      Result Value   WBC, Wet Prep HPF POC MANY (*)    All other components within normal limits  URINALYSIS, ROUTINE W REFLEX MICROSCOPIC  POC URINE PREG, ED  GC/CHLAMYDIA PROBE AMP ([place]) NOT AT [facility]                                                                                                                         EKG  EKG Interpretation  Date/Time:    Ventricular Rate:    PR Interval:    QRS Duration:   QT Interval:    QTC Calculation:   R Axis:     Text Interpretation:        Radiology No results found.  Pertinent labs & imaging results that were available during my care of the patient were reviewed by me and considered in my medical decision making (see chart for details).  Medications Ordered in ED Medications - No data to display                                                                                                                                  Procedures Procedures  (including critical care time)  Medical Decision Making / ED Course I have reviewed the nursing notes for this encounter and the patient's prior records (if available in EHR or on provided paperwork).   Oralia ManisMichaela Wildrick was evaluated in Emergency Department on 06/13/2019 for the symptoms described in the history of present illness. She was evaluated in the context of the global COVID-19 pandemic, which necessitated consideration that the patient might be at risk for infection with the SARS-CoV-2 virus that causes COVID-19. Institutional protocols and algorithms that pertain to the evaluation of patients at risk for COVID-19 are in a state of rapid change based on information released by regulatory bodies including the CDC and federal and state organizations. These policies and algorithms were followed during the patient's care in the ED.  Presents with dysuria and vaginal discharge.  Reports that she was treated for trichomonas 1 month ago.  Exam without evidence  of vulvovaginitis, active herpes lesion, cervicitis or PID.  UPT negative.  UA without evidence of infection.  Wet prep negative for Trichomonas or bacterial vaginosis.  No yeast infection.  GC/chlamydia sent, but previous cultures were negative and patient does not have evidence of cervicitis on  exam thus will not be treated empirically.  The patient is safe for discharge with strict return precautions.       Final Clinical Impression(s) / ED Diagnoses Final diagnoses:  Dysuria     The patient appears reasonably screened and/or stabilized for discharge and I doubt any other medical condition or other [facility] requiring further screening, evaluation, or treatment in the ED at this time prior to discharge.  Disposition: Discharge  Condition: Good  I have discussed the results, Dx and Tx plan with the patient who expressed understanding and agree(s) with the plan. Discharge instructions discussed at great length. The patient was given strict return precautions who verbalized understanding of the instructions. No further questions at time of discharge.    ED Discharge Orders    None       Follow Up: [facility], [facility] [address] [place] [id number] [phone]  Schedule an appointment as soon as possible for a visit  If symptoms do not improve or  worsen     This chart was dictated using voice recognition software.  Despite best efforts to proofread,  errors can occur which can change the documentation meaning.   Nira Connardama, Liana Camerer Eduardo, MD 06/13/19 [phone]

## 2019-06-16 LAB — GC/CHLAMYDIA PROBE AMP (~~LOC~~) NOT AT ARMC
Chlamydia: NEGATIVE
Neisseria Gonorrhea: NEGATIVE

## 2019-06-26 IMAGING — US US OB < 14 WEEKS - US OB TV
1 series · 15 of 28 positions shown · non-contrast
Comparison: Pelvic ultrasound 06/03/2017

CLINICAL DATA: Pregnant patient with vaginal spotting.

EXAM:
OBSTETRIC <14 WK US AND TRANSVAGINAL OB US
TECHNIQUE: Both transabdominal and transvaginal ultrasound examinations were
performed for complete evaluation of the gestation as well as the
maternal uterus, adnexal regions, and pelvic cul-de-sac.
Transvaginal technique was performed to assess early pregnancy.

[Series 1: us ob < 14 weeks - us ob tv · 15 of 28 slices shown]
[im 1/28]
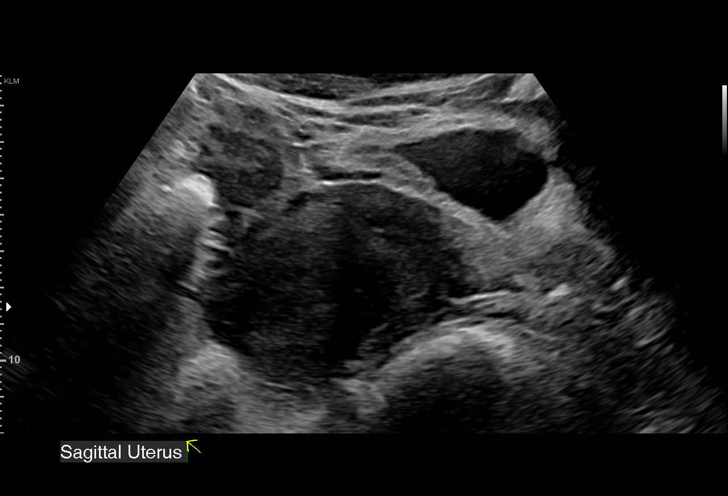
[im 3/28]
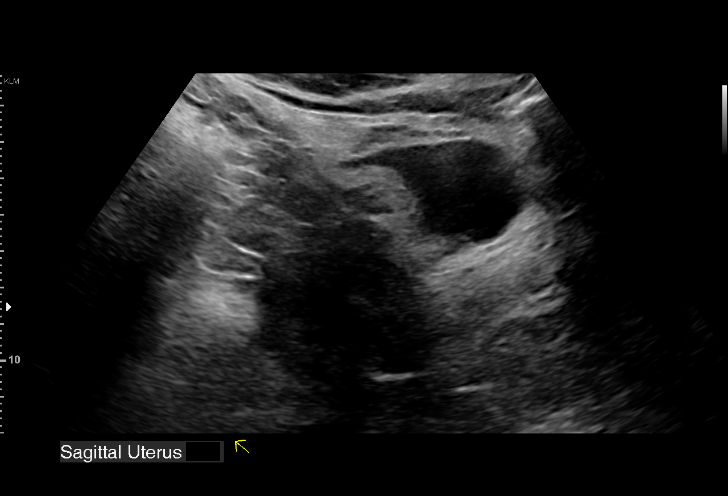
[im 5/28]
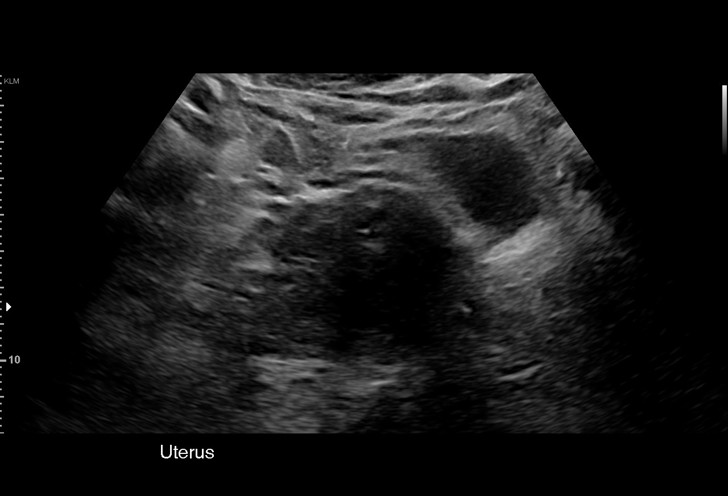
[im 7/28]
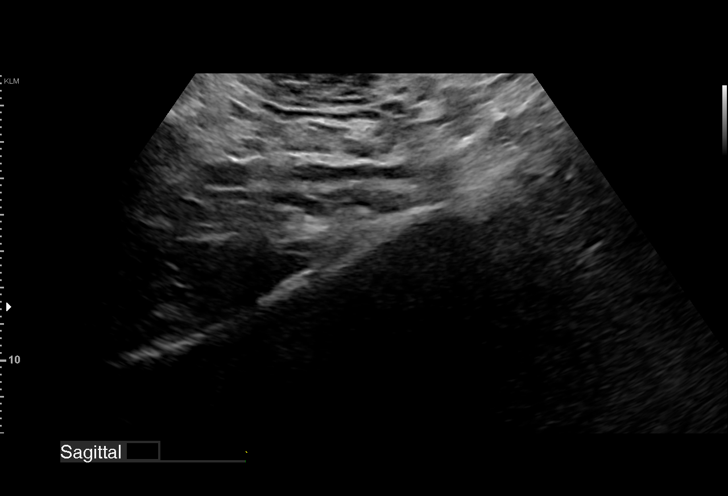
[im 9/28]
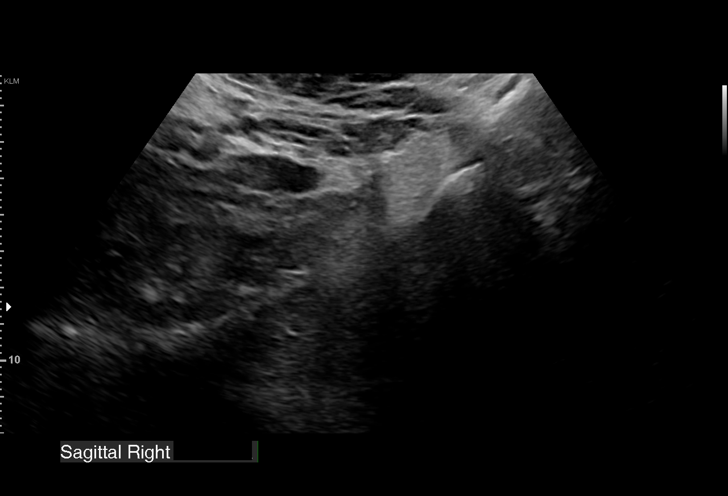
[im 11/28]
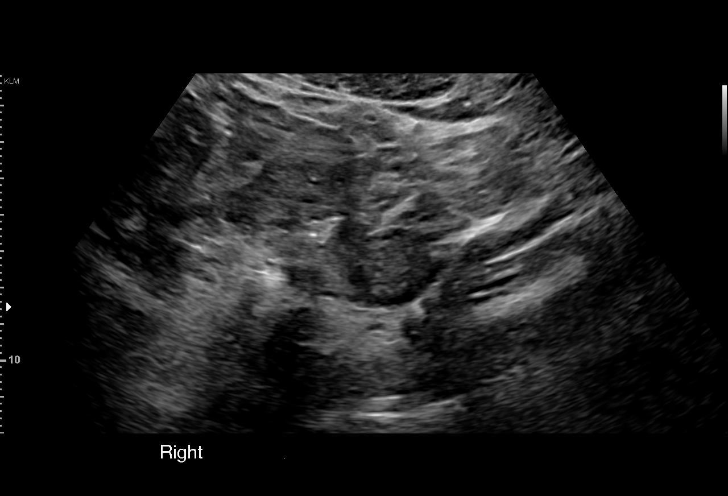
[im 13/28]
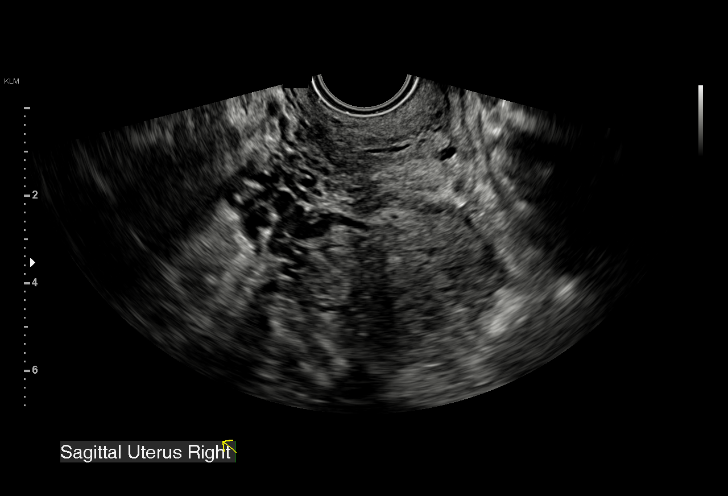
[im 15/28]
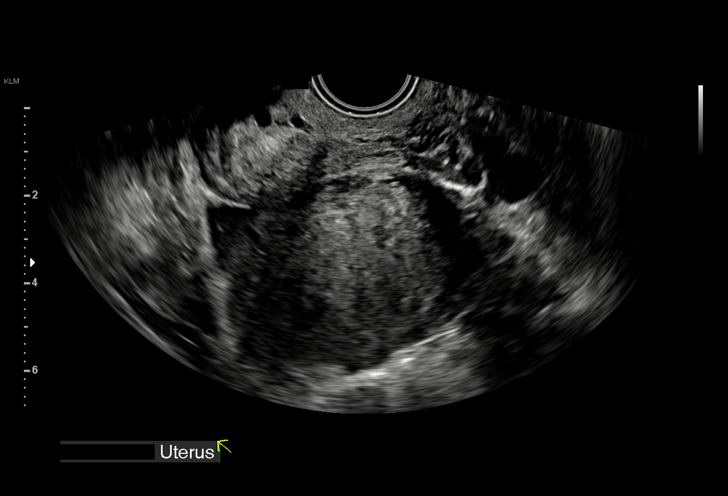
[im 16/28]
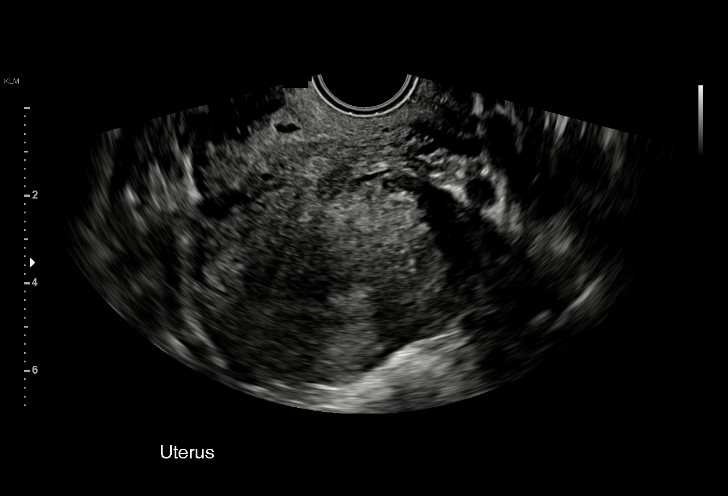
[im 18/28]
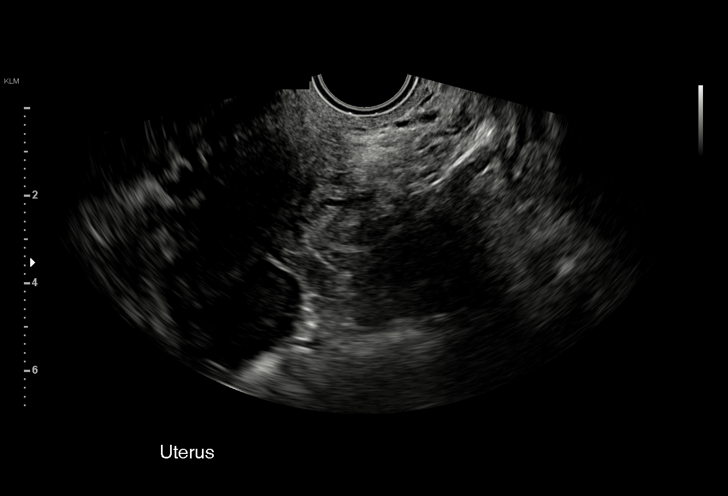
[im 20/28]
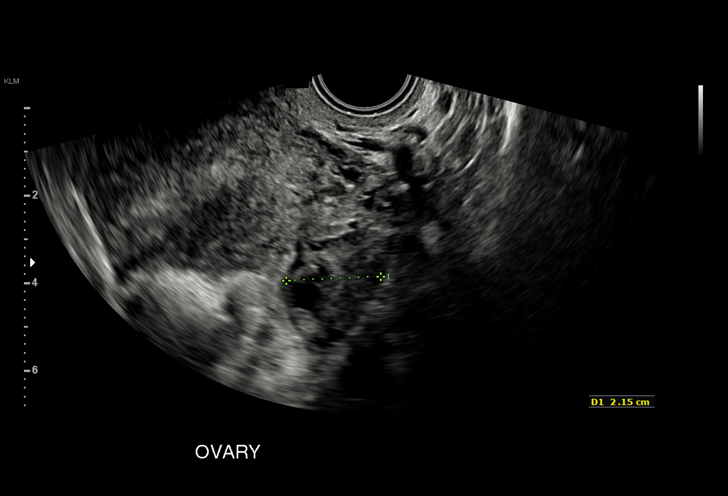
[im 22/28]
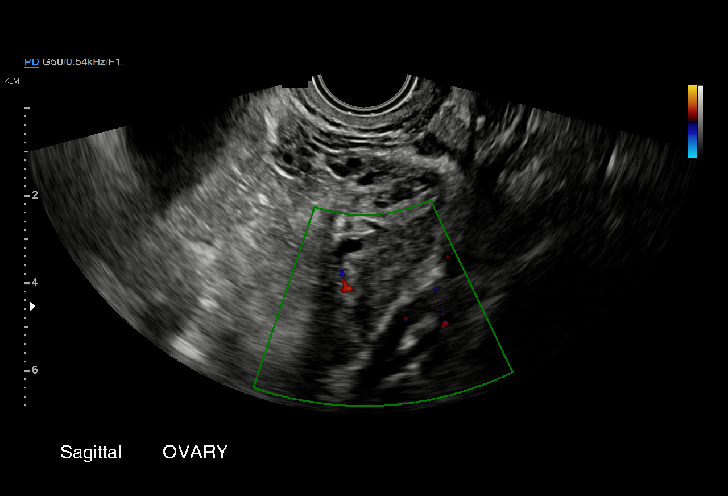
[im 24/28]
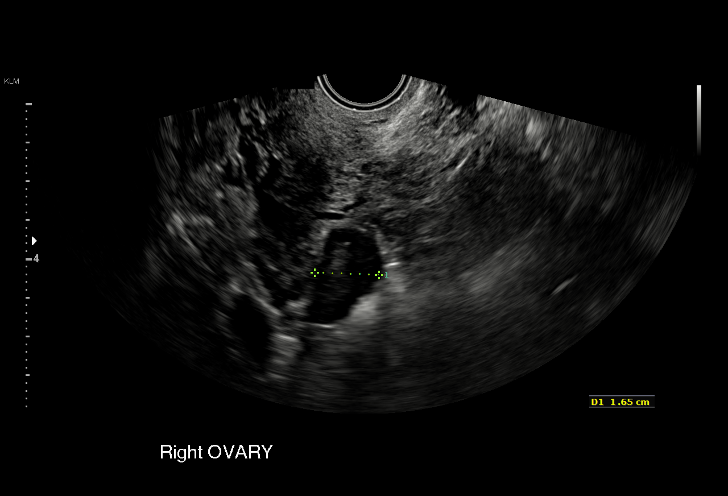
[im 26/28]
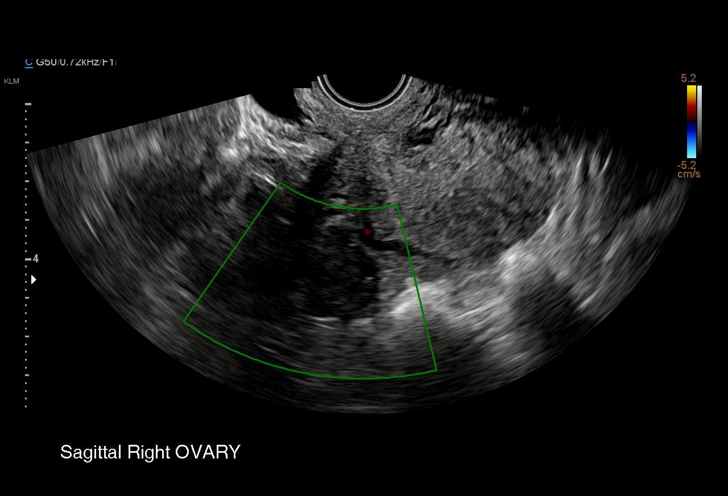
[im 28/28]
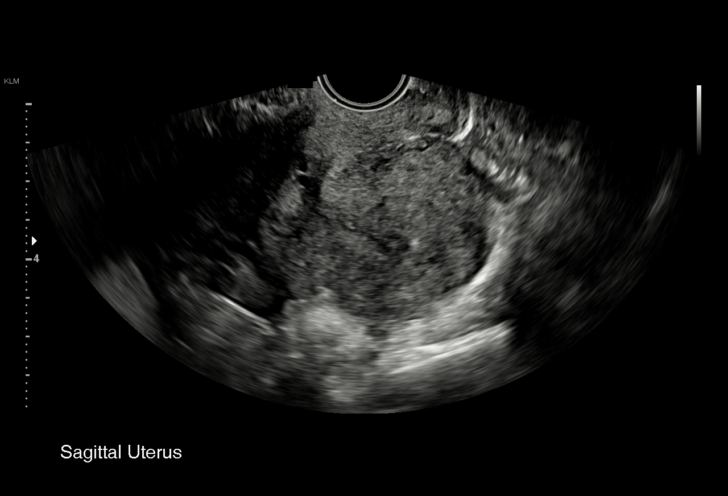

[15 of 28 positions shown; findings below may reference images not displayed]

FINDINGS: Intrauterine gestational sac: None

Yolk sac:  Not Visualized.

Embryo:  Not Visualized.

Cardiac Activity: Not Visualized.

Subchorionic hemorrhage:  None visualized.

Maternal uterus/adnexae: Normal right and left ovaries. No adnexal
mass identified. No free fluid in the pelvis.
IMPRESSION: No intrauterine gestation identified. In the setting of positive
pregnancy test and no definite intrauterine pregnancy, this reflects
a pregnancy of unknown location. Differential considerations include
early normal IUP, abnormal IUP, or nonvisualized ectopic pregnancy.
Differentiation is achieved with serial beta HCG supplemented by
repeat sonography as clinically warranted.

## 2019-07-25 ENCOUNTER — Encounter (HOSPITAL_COMMUNITY): Payer: Self-pay

## 2019-07-25 ENCOUNTER — Other Ambulatory Visit: Payer: Self-pay

## 2019-07-25 ENCOUNTER — Emergency Department (HOSPITAL_COMMUNITY)
Admission: EM | Admit: 2019-07-25 | Discharge: 2019-07-26 | Disposition: A | Discharge status: Home / Self Care | Payer: BC Managed Care – PPO | Attending: Emergency Medicine | Admitting: Emergency Medicine

## 2019-07-25 DIAGNOSIS — J45909 Unspecified asthma, uncomplicated: Secondary | ICD-10-CM | POA: Diagnosis not present

## 2019-07-25 DIAGNOSIS — R51 Headache: Secondary | ICD-10-CM | POA: Insufficient documentation

## 2019-07-25 DIAGNOSIS — N3001 Acute cystitis with hematuria: Secondary | ICD-10-CM | POA: Insufficient documentation

## 2019-07-25 DIAGNOSIS — R519 Headache, unspecified: Secondary | ICD-10-CM

## 2019-07-25 LAB — CBC WITH DIFFERENTIAL/PLATELET
Abs Immature Granulocytes: 0.05 10*3/uL (ref 0.00–0.07)
Basophils Absolute: 0 10*3/uL (ref 0.0–0.1)
Basophils Relative: 0 %
Eosinophils Absolute: 0 10*3/uL (ref 0.0–0.5)
Eosinophils Relative: 0 %
HCT: 38.4 % (ref 36.0–46.0)
Hemoglobin: 11.6 g/dL — ABNORMAL LOW (ref 12.0–15.0)
Immature Granulocytes: 0 %
Lymphocytes Relative: 12 %
Lymphs Abs: 1.5 10*3/uL (ref 0.7–4.0)
MCH: 23.9 pg — ABNORMAL LOW (ref 26.0–34.0)
MCHC: 30.2 g/dL (ref 30.0–36.0)
MCV: 79.2 fL — ABNORMAL LOW (ref 80.0–100.0)
Monocytes Absolute: 0.4 10*3/uL (ref 0.1–1.0)
Monocytes Relative: 4 %
Neutro Abs: 10.6 10*3/uL — ABNORMAL HIGH (ref 1.7–7.7)
Neutrophils Relative %: 84 %
Platelets: 400 10*3/uL (ref 150–400)
RBC: 4.85 MIL/uL (ref 3.87–5.11)
RDW: 15.8 % — ABNORMAL HIGH (ref 11.5–15.5)
WBC: 12.7 10*3/uL — ABNORMAL HIGH (ref 4.0–10.5)
nRBC: 0 % (ref 0.0–0.2)

## 2019-07-25 LAB — I-STAT BETA HCG BLOOD, ED (MC, WL, AP ONLY): I-stat hCG, quantitative: <5 m[IU]/mL (ref <5)

## 2019-07-25 LAB — BASIC METABOLIC PANEL
Anion gap: 9 (ref 5–15)
BUN: 7 mg/dL (ref 6–20)
CO2: 24 mmol/L (ref 22–32)
Calcium: 9 mg/dL (ref 8.9–10.3)
Chloride: 102 mmol/L (ref 98–111)
Creatinine, Ser: 0.6 mg/dL (ref 0.44–1.00)
GFR calc Af Amer: >60 mL/min (ref >60)
GFR calc non Af Amer: >60 mL/min (ref >60)
Glucose, Bld: 120 mg/dL — ABNORMAL HIGH (ref 70–99)
Potassium: 3.8 mmol/L (ref 3.5–5.1)
Sodium: 135 mmol/L (ref 135–145)

## 2019-07-25 MED ORDER — SODIUM CHLORIDE 0.9 % IV BOLUS
1000.0000 mL | Freq: Once | INTRAVENOUS | Status: AC
  Administered 2019-07-25: 1000 mL via INTRAVENOUS

## 2019-07-25 MED ORDER — PROCHLORPERAZINE EDISYLATE 10 MG/2ML IJ SOLN
10.0000 mg | Freq: Once | INTRAMUSCULAR | Status: AC
  Administered 2019-07-25: 10 mg via INTRAVENOUS
  Filled 2019-07-25: qty 2

## 2019-07-25 MED ORDER — KETOROLAC TROMETHAMINE 30 MG/ML IJ SOLN
30.0000 mg | Freq: Once | INTRAMUSCULAR | Status: AC
  Administered 2019-07-25: 30 mg via INTRAVENOUS
  Filled 2019-07-25: qty 1

## 2019-07-25 MED ORDER — DIPHENHYDRAMINE HCL 50 MG/ML IJ SOLN
25.0000 mg | Freq: Once | INTRAMUSCULAR | Status: AC
  Administered 2019-07-25: 25 mg via INTRAVENOUS
  Filled 2019-07-25: qty 1

## 2019-07-25 NOTE — ED Triage Notes (Signed)
Pt states that she started having a headache last night . Pt is also having photosensitivity. Pt states she has had chills as well.Pt last took ibuprofen at 10a

## 2019-07-26 LAB — URINALYSIS, ROUTINE W REFLEX MICROSCOPIC
Bilirubin Urine: NEGATIVE
Glucose, UA: NEGATIVE mg/dL
Hgb urine dipstick: NEGATIVE
Ketones, ur: NEGATIVE mg/dL
Nitrite: NEGATIVE
Protein, ur: 100 mg/dL — AB
Specific Gravity, Urine: 1.025 (ref 1.005–1.030)
pH: 8 (ref 5.0–8.0)

## 2019-07-26 MED ORDER — LEVOFLOXACIN 500 MG PO TABS: 500.0000 mg | ORAL_TABLET | Freq: Every day | ORAL | 0 refills | Status: DC

## 2019-07-26 MED ORDER — ACETAMINOPHEN 500 MG PO TABS
1000.0000 mg | ORAL_TABLET | Freq: Once | ORAL | Status: AC
  Administered 2019-07-26: 1000 mg via ORAL
  Filled 2019-07-26: qty 2

## 2019-07-26 MED ORDER — FENTANYL CITRATE (PF) 100 MCG/2ML IJ SOLN
100.0000 ug | Freq: Once | INTRAMUSCULAR | Status: AC
  Administered 2019-07-26: 02:00:00 100 ug via INTRAVENOUS
  Filled 2019-07-26: qty 2

## 2019-07-26 MED ORDER — DEXAMETHASONE SODIUM PHOSPHATE 10 MG/ML IJ SOLN
10.0000 mg | Freq: Once | INTRAMUSCULAR | Status: AC
  Administered 2019-07-26: 10 mg via INTRAVENOUS
  Filled 2019-07-26: qty 1

## 2019-07-26 MED ORDER — IBUPROFEN 600 MG PO TABS: 600.0000 mg | ORAL_TABLET | Freq: Four times a day (QID) | ORAL | 0 refills | Status: DC | PRN

## 2019-07-26 MED ORDER — FENTANYL CITRATE (PF) 100 MCG/2ML IJ SOLN
100.0000 ug | Freq: Once | INTRAMUSCULAR | Status: AC
  Administered 2019-07-26: 100 ug via INTRAVENOUS
  Filled 2019-07-26: qty 2

## 2019-07-26 MED ORDER — SODIUM CHLORIDE 0.9 % IV BOLUS
1000.0000 mL | Freq: Once | INTRAVENOUS | Status: AC
  Administered 2019-07-26: 1000 mL via INTRAVENOUS

## 2019-07-26 NOTE — Discharge Instructions (Signed)
Take the medications as prescribed. Follow up with your doctor for recheck if symptoms persist.

## 2019-07-26 NOTE — ED Provider Notes (Signed)
Headache, bilateral, photophobia No h/o migraines, h/o "headaches" Headache cocktail given with some improvement Pending second dose medications   Plan: will need to be reassessed Labs unremarkable, UA pending  Patient's pain is improved but not completely resolved. She has developed a fever during the interim.   UA positive for infection. She does report a vaginal itch for which she used a yeast cream. Pelvic exam offered for evaluation of STD but patient declined. She denies discharge or high risk sexual behavior. Will treat for UTI.   She describes HA as frontal. She reports symptoms of chills as well. Consider sinus infection as she also states she seems to get symptoms of cough at the change of season, sometimes similar headache.   Will treat with Levaquin which will aim at both sinuses and UTI. She is felt appropriate for discharge home.    Charlann Lange, PA-C 07/26/19 0304    Ward, Delice Bison, DO 07/26/19 [phone]

## 2019-07-26 NOTE — ED Provider Notes (Signed)
[facility]-EMERGENCY DEPT Provider Note   CSN: [id number] Arrival date & time: 07/25/19  1834     History   Chief Complaint Chief Complaint  Patient presents with  . Headache    HPI Jaime Ramos is a 23 y.o. female.     HPI Patient presents to the emergency department with a 2-day history of headache with light sensitivity.  The patient states she is like she has been chilled as well.  The patient states she did take some ibuprofen with minimal relief of her symptoms.  Patient states that she has had no other symptoms.  Patient states that nothing seems to make the condition better.  The patient denies chest pain, shortness of breath, blurred vision, neck pain, fever, cough, weakness, numbness, dizziness, anorexia, edema, abdominal pain, nausea, vomiting, diarrhea, rash, back pain, dysuria, hematemesis, bloody stool, near syncope, or syncope. Past Medical History:  Diagnosis Date  . 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 Anxiety   . Asthma   . Chlamydia   . Depression    doing good  . Diabetes mellitus without complication (HCC)    Pre Diabetic  . Obesity   . Precocious puberty   . Prediabetes   . Trichomonal vaginitis     Patient Active Problem List   Diagnosis Date Noted  . Trichomoniasis 09/23/2018  . Abnormal Pap smear of cervix 12/20/2016  . MDD (major depressive disorder), recurrent episode, moderate (HCC) 01/25/2016  . Chlamydia 04/27/2015  . Nexplanon removal 02/09/2015  . Preterm delivery 10/10/2013  . Preterm labor 10/09/2013  . Depression 10/02/2013  . Vaginal bleeding before [redacted] weeks gestation 10/02/2013  . Acanthosis 05/28/2012  . Menorrhagia with regular cycle 07/31/2011  . Goiter 07/31/2011  . Prediabetes   . Obesity 05/17/2011    Past Surgical History:  Procedure Laterality Date  . DILATION AND CURETTAGE OF UTERUS N/A 11/09/2014   Procedure: DILATATION AND CURETTAGE;  Surgeon: [name], MD;   Location: [facility];  Service: Gynecology;  Laterality: N/A;  . DILATION AND CURETTAGE OF UTERUS  2016     OB History    Gravida  3   Para  1   Term      Preterm  1   AB  2   Living  0     SAB  1   TAB      Ectopic  1   Multiple      Live Births  1            Home Medications    Prior to Admission medications   Medication Sig Start Date End Date Taking? Authorizing Provider  benzonatate (TESSALON) 100 MG capsule Take 1 capsule (100 mg total) by mouth 3 (three) times daily as needed for cough. Patient not taking: Reported on 06/13/2019 01/20/19   Long, Arlyss RepressJoshua G, MD  hydrocortisone (ANUSOL-HC) 2.5 % rectal cream Apply rectally 2 times daily Patient not taking: Reported on 01/19/2019 12/25/18   Gwyneth SproutPlunkett, Whitney, MD  hydrocortisone (ANUSOL-HC) 25 MG suppository Place 1 suppository (25 mg total) rectally 2 (two) times daily. Patient not taking: Reported on 01/19/2019 12/25/18   Gwyneth SproutPlunkett, Whitney, MD  metroNIDAZOLE (FLAGYL) 500 MG tablet Take 1 tablet (500 mg total) by mouth 2 (two) times daily. Patient not taking: Reported on 12/25/2018 11/24/18   Ward, Layla MawKristen N, DO    Family History Family History  Problem Relation Age of Onset  . Diabetes Mother   .  Hypertension Mother   . Obesity Mother   . Fibroids Mother   . Cancer Mother        thyroid  . Obesity Father   . Obesity Maternal Aunt   . Fibroids Maternal Aunt   . Obesity Maternal Uncle   . Obesity Paternal Aunt   . Diabetes Maternal Grandmother   . Obesity Maternal Grandmother   . Hypertension Maternal Grandmother   . Diabetes Maternal Grandfather   . Obesity Maternal Grandfather   . Stroke Maternal Grandfather   . Heart disease Maternal Grandfather   . Hypertension Maternal Grandfather   . Obesity Paternal Grandfather   . Heart disease Paternal Grandfather   . Bipolar disorder Brother   . Bipolar disorder Brother     Social History Social History   Tobacco Use  . Smoking status: Never Smoker  .  Smokeless tobacco: Never Used  Substance Use Topics  . Alcohol use: No  . Drug use: No     Allergies   Patient has no known allergies.   Review of Systems Review of Systems  All other systems negative except as documented in the HPI. All pertinent positives and negatives as reviewed in the HPI. Physical Exam Updated Vital Signs BP (!) 147/93   Pulse 99   Temp 99.6 F (37.6 C) (Oral)   Resp 16   Ht 5\' 6"  (1.676 m)   Wt 117.9 kg   LMP 06/30/2019   SpO2 100%   BMI 41.97 kg/m   Physical Exam Vitals signs and nursing note reviewed.  Constitutional:      General: She is not in acute distress.    Appearance: She is well-developed.  HENT:     Head: Normocephalic and atraumatic.  Eyes:     Pupils: Pupils are equal, round, and reactive to light.  Neck:     Musculoskeletal: Normal range of motion and neck supple.  Cardiovascular:     Rate and Rhythm: Normal rate and regular rhythm.     Heart sounds: Normal heart sounds. No murmur. No friction rub. No gallop.   Pulmonary:     Effort: Pulmonary effort is normal. No respiratory distress.     Breath sounds: Normal breath sounds. No wheezing.  Abdominal:     General: Bowel sounds are normal. There is no distension.     Palpations: Abdomen is soft.     Tenderness: There is no abdominal tenderness.  Skin:    General: Skin is warm and dry.     Capillary Refill: Capillary refill takes less than 2 seconds.     Findings: No erythema or rash.  Neurological:     Mental Status: She is alert and oriented to person, place, and time.     Motor: No abnormal muscle tone.     Coordination: Coordination normal.  Psychiatric:        Behavior: Behavior normal.      ED Treatments / Results  Labs (all labs ordered are listed, but only abnormal results are displayed) Labs Reviewed  BASIC METABOLIC PANEL - Abnormal; Notable for the following components:      Result Value   Glucose, Bld 120 (*)    All other components within normal  limits  CBC WITH DIFFERENTIAL/PLATELET - Abnormal; Notable for the following components:   WBC 12.7 (*)    Hemoglobin 11.6 (*)    MCV 79.2 (*)    MCH 23.9 (*)    RDW 15.8 (*)    Neutro Abs 10.6 (*)  All other components within normal limits  URINALYSIS, ROUTINE W REFLEX MICROSCOPIC  I-STAT BETA HCG BLOOD, ED ([facility], [facility], [facility] ONLY)    EKG None  Radiology No results found.  Procedures Procedures (including critical care time)  Medications Ordered in ED Medications  sodium chloride 0.9 % bolus 1,000 mL (1,000 mLs Intravenous New Bag/Given 07/25/19 2056)  ketorolac (TORADOL) 30 MG/ML injection 30 mg (30 mg Intravenous Given 07/25/19 2056)  prochlorperazine (COMPAZINE) injection 10 mg (10 mg Intravenous Given 07/25/19 2056)  diphenhydrAMINE (BENADRYL) injection 25 mg (25 mg Intravenous Given 07/25/19 2105)     Initial Impression / Assessment and Plan / ED Course  I have reviewed the triage vital signs and the nursing notes.  Pertinent labs & imaging results that were available during my care of the patient were reviewed by me and considered in my medical decision making (see chart for details).       Patient will be treated for a migraine type headache.  The patient is feeling some better following the initial round of medications but not complete resolution.  Still states it is hurting a fair amount.  She states that she longer has the photosensitivity.  Patient be given further IV fluids and reassessed Final Clinical Impressions(s) / ED Diagnoses   Final diagnoses:  None    ED Discharge Orders    None       Dalia Heading, PA-C 07/26/19 [place], [place], DO 07/26/19 1628

## 2019-08-28 ENCOUNTER — Other Ambulatory Visit: Payer: Self-pay

## 2019-08-28 ENCOUNTER — Ambulatory Visit
Admission: EM | Admit: 2019-08-28 | Discharge: 2019-08-28 | Disposition: A | Discharge status: Home / Self Care | Payer: BC Managed Care – PPO | Attending: Emergency Medicine | Admitting: Emergency Medicine

## 2019-08-28 DIAGNOSIS — Z20828 Contact with and (suspected) exposure to other viral communicable diseases: Secondary | ICD-10-CM | POA: Diagnosis not present

## 2019-08-28 DIAGNOSIS — Z20822 Contact with and (suspected) exposure to covid-19: Secondary | ICD-10-CM

## 2019-08-28 NOTE — Discharge Instructions (Signed)
COVID testing ordered.  It will take between 5-7 days for test results.  Someone will contact you regarding abnormal results.    In the meantime: You should remain isolated in your home for 10 days from symptom onset AND greater than 72 hours after symptoms resolution (absence of fever without the use of fever-reducing medication and improvement in respiratory symptoms), whichever is longer OR 14 days from exposure Get plenty of rest and push fluids You may use OTC zyrtec as needed for nasal congestion, runny nose, and/or sore throat You may use OTC flonase as needed for nasal congestion and runny nose Use medications daily for symptom relief Use OTC medications like ibuprofen or tylenol as needed fever or pain Call or go to the ED if you have any new or worsening symptoms such as fever, cough, shortness of breath, chest tightness, chest pain, turning blue, changes in mental status, etc..Marland Kitchen

## 2019-08-28 NOTE — ED Provider Notes (Signed)
[facility]   [id number] 08/28/19 Arrival Time: 1541   CC: COVID exposure; covid test  SUBJECTIVE: History from: patient.  Jaime Ramos is a 23 y.o. female who presents for COVID testing.  Admits to positive COVID exposure at work 1 week ago.  Denies aggravating or alleviating symptoms.  Denies previous COVID infection.   Denies fever, chills, fatigue, nasal congestion, rhinorrhea, sore throat, cough, SOB, wheezing, chest pain, nausea, vomiting, changes in bowel or bladder habits.     ROS: As per HPI.  All other pertinent ROS negative.     Past Medical History:  Diagnosis Date  . 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 Anxiety   . Asthma   . Chlamydia   . Depression    doing good  . Diabetes mellitus without complication (HCC)    Pre Diabetic  . Obesity   . Precocious puberty   . Prediabetes   . Trichomonal vaginitis    Past Surgical History:  Procedure Laterality Date  . DILATION AND CURETTAGE OF UTERUS N/A 11/09/2014   Procedure: DILATATION AND CURETTAGE;  Surgeon: Allie Bossier, MD;  Location: [facility];  Service: Gynecology;  Laterality: N/A;  . DILATION AND CURETTAGE OF UTERUS  2016   No Known Allergies No current facility-administered medications on file prior to encounter.    Current Outpatient Medications on File Prior to Encounter  Medication Sig Dispense Refill  . ibuprofen (ADVIL) 600 MG tablet Take 1 tablet (600 mg total) by mouth every 6 (six) hours as needed. 30 tablet 0   Social History   Socioeconomic History  . Marital status: Single    Spouse name: Not on file  . Number of children: Not on file  . Years of education: Not on file  . Highest education level: Not on file  Occupational History  . Not on file  Social Needs  . Financial resource strain: Not on file  . Food insecurity    Worry: Not on file    Inability: Not on file  . Transportation needs    Medical: Not on file    Non-medical: Not on file   Tobacco Use  . Smoking status: Never Smoker  . Smokeless tobacco: Never Used  Substance and Sexual Activity  . Alcohol use: No  . Drug use: No  . Sexual activity: Yes    Birth control/protection: None  Lifestyle  . Physical activity    Days per week: Not on file    Minutes per session: Not on file  . Stress: Not on file  Relationships  . Social Musician on phone: Not on file    Gets together: Not on file    Attends religious service: Not on file    Active member of club or organization: Not on file    Attends meetings of clubs or organizations: Not on file    Relationship status: Not on file  . Intimate partner violence    Fear of current or ex partner: Not on file    Emotionally abused: Not on file    Physically abused: Not on file    Forced sexual activity: Not on file  Other Topics Concern  . Not on file  Social History Narrative   Lives with mom and step father. Rare visits with biologic dad. 11 th grade [employer] Wants to play softball this year.    Family History  Problem Relation Age of Onset  .  Diabetes Mother   . Hypertension Mother   . Obesity Mother   . Fibroids Mother   . Cancer Mother        thyroid  . Obesity Father   . Obesity Maternal Aunt   . Fibroids Maternal Aunt   . Obesity Maternal Uncle   . Obesity Paternal Aunt   . Diabetes Maternal Grandmother   . Obesity Maternal Grandmother   . Hypertension Maternal Grandmother   . Diabetes Maternal Grandfather   . Obesity Maternal Grandfather   . Stroke Maternal Grandfather   . Heart disease Maternal Grandfather   . Hypertension Maternal Grandfather   . Obesity Paternal Grandfather   . Heart disease Paternal Grandfather   . Bipolar disorder Brother   . Bipolar disorder Brother     OBJECTIVE:  Vitals:   08/28/19 1550  BP: 114/77  Pulse: 82  Resp: 18  Temp: 99.1 F (37.3 C)  SpO2: 98%     General appearance: alert; well-appearing, nontoxic; speaking in full  sentences and tolerating own secretions HEENT: NCAT; Ears: EACs clear, TMs pearly gray; Eyes: PERRL.  EOM grossly intact. Nose: nares patent without rhinorrhea, Throat: oropharynx clear, tonsils non erythematous or enlarged, uvula midline  Neck: supple without LAD Lungs: unlabored respirations, symmetrical air entry; cough: absent; no respiratory distress; CTAB Heart: regular rate and rhythm.  Radial pulses 2+ symmetrical bilaterally Skin: warm and dry Psychological: alert and cooperative; normal mood and affect  ASSESSMENT & PLAN:  1. Exposure to COVID-19 virus   2. Encounter for laboratory testing for COVID-19 virus     COVID testing ordered.  It will take between 5-7 days for test results.  Someone will contact you regarding abnormal results.    In the meantime: You should remain isolated in your home for 10 days from symptom onset AND greater than 72 hours after symptoms resolution (absence of fever without the use of fever-reducing medication and improvement in respiratory symptoms), whichever is longer OR 14 days from exposure Get plenty of rest and push fluids You may use OTC zyrtec as needed for nasal congestion, runny nose, and/or sore throat You may use OTC flonase as needed for nasal congestion and runny nose Use medications daily for symptom relief Use OTC medications like ibuprofen or tylenol as needed fever or pain Call or go to the ED if you have any new or worsening symptoms such as fever, cough, shortness of breath, chest tightness, chest pain, turning blue, changes in mental status, etc...   Reviewed expectations re: course of current medical issues. Questions answered. Outlined signs and symptoms indicating need for more acute intervention. Patient verbalized understanding. After Visit Summary given.         Lestine Box, PA-C 08/28/19 1606

## 2019-08-28 NOTE — ED Triage Notes (Signed)
Pt presents with concern of  covid exposure that she had last Friday , pt has no symptoms

## 2019-08-30 LAB — NOVEL CORONAVIRUS, NAA: SARS-CoV-2, NAA: NOT DETECTED

## 2019-11-25 ENCOUNTER — Other Ambulatory Visit: Payer: Self-pay

## 2019-11-25 ENCOUNTER — Ambulatory Visit
Admission: EM | Admit: 2019-11-25 | Discharge: 2019-11-25 | Disposition: A | Discharge status: Home / Self Care | Payer: BC Managed Care – PPO | Attending: Emergency Medicine | Admitting: Emergency Medicine

## 2019-11-25 DIAGNOSIS — Z113 Encounter for screening for infections with a predominantly sexual mode of transmission: Secondary | ICD-10-CM | POA: Diagnosis present

## 2019-11-25 DIAGNOSIS — R3989 Other symptoms and signs involving the genitourinary system: Secondary | ICD-10-CM | POA: Diagnosis present

## 2019-11-25 LAB — POCT URINALYSIS DIP (MANUAL ENTRY)
Bilirubin, UA: NEGATIVE
Glucose, UA: NEGATIVE mg/dL
Ketones, POC UA: NEGATIVE mg/dL
Leukocytes, UA: NEGATIVE
Nitrite, UA: NEGATIVE
Protein Ur, POC: NEGATIVE mg/dL
Spec Grav, UA: 1.025 (ref 1.010–1.025)
Urobilinogen, UA: 0.2 E.U./dL
pH, UA: 7 (ref 5.0–8.0)

## 2019-11-25 LAB — POCT URINE PREGNANCY: Preg Test, Ur: NEGATIVE

## 2019-11-25 MED ORDER — ACYCLOVIR 400 MG PO TABS: 400.0000 mg | ORAL_TABLET | Freq: Three times a day (TID) | ORAL | 0 refills | Status: AC

## 2019-11-25 MED ORDER — IBUPROFEN 800 MG PO TABS: 800.0000 mg | ORAL_TABLET | Freq: Three times a day (TID) | ORAL | 0 refills | Status: DC

## 2019-11-25 NOTE — ED Provider Notes (Addendum)
[facility]    CSN: [id number] Arrival date & time: 11/25/19  1833      History   Chief Complaint Chief Complaint  Patient presents with  . Vaginal Pain    HPI Jaime Ramos is a 24 y.o. female.   Jaime Ramos is a 24 years old female who presents requesting STI screening.  Currently asymptomatic.  Develop a sore on her right labia that developed 1 week ago.  Partner asymptomatic.  Last unprotected sexual encounter 2 weeks ago.  Sexually active with 1 female partner.  Denies similar symptoms in the past.  Denies fever, chills, nausea, vomiting, abdominal or pelvic pain, urinary symptoms, vaginal itching, vaginal odor, vaginal bleeding, dyspareunia, vaginal rashes or lesions.       Past Medical History:  Diagnosis Date  . 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 Anxiety   . Asthma   . Chlamydia   . Depression    doing good  . Diabetes mellitus without complication (HCC)    Pre Diabetic  . Obesity   . Precocious puberty   . Prediabetes   . Trichomonal vaginitis     Patient Active Problem List   Diagnosis Date Noted  . Trichomoniasis 09/23/2018  . Abnormal Pap smear of cervix 12/20/2016  . MDD (major depressive disorder), recurrent episode, moderate (HCC) 01/25/2016  . Chlamydia 04/27/2015  . Nexplanon removal 02/09/2015  . Preterm delivery 10/10/2013  . Preterm labor 10/09/2013  . Depression 10/02/2013  . Vaginal bleeding before [redacted] weeks gestation 10/02/2013  . Acanthosis 05/28/2012  . Menorrhagia with regular cycle 07/31/2011  . Goiter 07/31/2011  . Prediabetes   . Obesity 05/17/2011    Past Surgical History:  Procedure Laterality Date  . DILATION AND CURETTAGE OF UTERUS N/A 11/09/2014   Procedure: DILATATION AND CURETTAGE;  Surgeon: Allie Bossier, MD;  Location: [facility];  Service: Gynecology;  Laterality: N/A;  . DILATION AND CURETTAGE OF UTERUS  2016    OB History    Gravida  3   Para  1   Term      Preterm  1    AB  2   Living  0     SAB  1   TAB      Ectopic  1   Multiple      Live Births  1            Home Medications    Prior to Admission medications   Medication Sig Start Date End Date Taking? Authorizing Provider  acyclovir (ZOVIRAX) 400 MG tablet Take 1 tablet (400 mg total) by mouth 3 (three) times daily for 7 days. 11/25/19 12/02/19  Kechia Yahnke, Zachery Dakins, FNP  ibuprofen (ADVIL) 800 MG tablet Take 1 tablet (800 mg total) by mouth 3 (three) times daily. Take with food 11/25/19   Durward Parcel, FNP    Family History Family History  Problem Relation Age of Onset  . Diabetes Mother   . Hypertension Mother   . Obesity Mother   . Fibroids Mother   . Cancer Mother        thyroid  . Obesity Father   . Obesity Maternal Aunt   . Fibroids Maternal Aunt   . Obesity Maternal Uncle   . Obesity Paternal Aunt   . Diabetes Maternal Grandmother   . Obesity Maternal Grandmother   . Hypertension Maternal Grandmother   . Diabetes Maternal Grandfather   . Obesity Maternal Grandfather   .  Stroke Maternal Grandfather   . Heart disease Maternal Grandfather   . Hypertension Maternal Grandfather   . Obesity Paternal Grandfather   . Heart disease Paternal Grandfather   . Bipolar disorder Brother   . Bipolar disorder Brother     Social History Social History   Tobacco Use  . Smoking status: Never Smoker  . Smokeless tobacco: Never Used  Substance Use Topics  . Alcohol use: No  . Drug use: No     Allergies   Patient has no known allergies.   Review of Systems Review of Systems  Constitutional: Negative.   Respiratory: Negative.   Cardiovascular: Negative.   Genitourinary: Positive for genital sores.  All other systems reviewed and are negative.    Physical Exam Triage Vital Signs ED Triage Vitals  Enc Vitals Group     BP 11/25/19 1850 119/82     Pulse Rate 11/25/19 1850 72     Resp 11/25/19 1850 18     Temp 11/25/19 1850 98.4 F (36.9 C)     Temp src --       SpO2 11/25/19 1850 98 %     Weight --      Height --      Head Circumference --      Peak Flow --      Pain Score 11/25/19 1848 8     Pain Loc --      Pain Edu? --      Excl. in GC? --    No data found.  Updated Vital Signs BP 119/82   Pulse 72   Temp 98.4 F (36.9 C)   Resp 18   LMP 11/22/2019   SpO2 98%   Visual Acuity Right Eye Distance:   Left Eye Distance:   Bilateral Distance:    Right Eye Near:   Left Eye Near:    Bilateral Near:     Physical Exam Vitals and nursing note reviewed. Exam conducted with a chaperone present (Amy RN).  Constitutional:      General: She is not in acute distress.    Appearance: Normal appearance. She is normal weight. She is not ill-appearing or toxic-appearing.  Cardiovascular:     Rate and Rhythm: Normal rate and regular rhythm.     Pulses: Normal pulses.     Heart sounds: Normal heart sounds. No murmur.  Pulmonary:     Effort: Pulmonary effort is normal. No respiratory distress.     Breath sounds: Normal breath sounds. No stridor. No wheezing, rhonchi or rales.  Chest:     Chest wall: No tenderness.  Genitourinary:    Labia:        Right: Tenderness present. No rash, lesion or injury.        Left: No rash, tenderness, lesion or injury.        Comments: Genital sore present on right labia Neurological:     Mental Status: She is alert.      UC Treatments / Results  Labs (all labs ordered are listed, but only abnormal results are displayed) Labs Reviewed  POCT URINALYSIS DIP (MANUAL ENTRY) - Abnormal; Notable for the following components:      Result Value   Blood, UA large (*)    All other components within normal limits  HSV CULTURE AND TYPING  HIV ANTIBODY (ROUTINE TESTING W REFLEX)  RPR  POCT URINE PREGNANCY  CERVICOVAGINAL ANCILLARY ONLY    EKG   Radiology No results found.  Procedures Procedures (including critical  care time)  Medications Ordered in UC Medications - No data to  display  Initial Impression / Assessment and Plan / UC Course  I have reviewed the triage vital signs and the nursing notes.  Pertinent labs & imaging results that were available during my care of the patient were reviewed by me and considered in my medical decision making (see chart for details).    Point of care urinalysis was completed. It showed large amount of blood from menstruation.  Point-of-care urine pregnancy test was negative Patient symptom is consistent with genital herpes Acyclovir will be prescribed Ibuprofen prescribed for pain Advised patient to take medication as prescribed and to completion STD screening tests were ordered  Final Clinical Impressions(s) / UC Diagnoses   Final diagnoses:  Screening for STD (sexually transmitted disease)  Genital sore     Discharge Instructions     Urine cytology/ vaginal self swab obtained STD screening was completed Someone will call with abnormal result Prescribed acyclovir 400 mg 3 times a day for 7 days Take medications as prescribed and to completion We will follow up with you regarding the results of your test Follow up with PCP if symptoms persists Return here or go to ER if you have any new or worsening symptoms       ED Prescriptions    Medication Sig Dispense Auth. Provider   acyclovir (ZOVIRAX) 400 MG tablet Take 1 tablet (400 mg total) by mouth 3 (three) times daily for 7 days. 21 tablet Trea Latner S, FNP   ibuprofen (ADVIL) 800 MG tablet Take 1 tablet (800 mg total) by mouth 3 (three) times daily. Take with food 21 tablet Redmond Whittley, Darrelyn Hillock, FNP     PDMP not reviewed this encounter.   Emerson Monte, FNP 11/25/19 1925    Emerson Monte, FNP 11/30/19 [phone]

## 2019-11-25 NOTE — ED Triage Notes (Signed)
Pt presents with sore on labia that is painful and is difficult to walk, developed about a week ago, pt would also like std testing while here

## 2019-11-25 NOTE — Discharge Instructions (Addendum)
Urine cytology/ vaginal self swab obtained STD screening was completed Someone will call with abnormal result Prescribed acyclovir 400 mg 3 times a day for 7 days Take medications as prescribed and to completion We will follow up with you regarding the results of your test Follow up with PCP if symptoms persists Return here or go to ER if you have any new or worsening symptoms

## 2019-11-26 LAB — SPECIMEN STATUS REPORT

## 2019-11-26 LAB — HIV ANTIBODY (ROUTINE TESTING W REFLEX): HIV Screen 4th Generation wRfx: NONREACTIVE

## 2019-11-26 LAB — RPR: RPR Ser Ql: NONREACTIVE

## 2019-11-27 LAB — CERVICOVAGINAL ANCILLARY ONLY
Bacterial vaginitis: POSITIVE — AB
Candida vaginitis: NEGATIVE
Chlamydia: NEGATIVE
Neisseria Gonorrhea: NEGATIVE
Trichomonas: NEGATIVE

## 2019-11-29 LAB — HSV CULTURE AND TYPING

## 2019-11-30 ENCOUNTER — Telehealth (HOSPITAL_COMMUNITY): Payer: Self-pay | Admitting: Emergency Medicine

## 2019-11-30 MED ORDER — METRONIDAZOLE 500 MG PO TABS: 500.0000 mg | ORAL_TABLET | Freq: Two times a day (BID) | ORAL | 0 refills | Status: AC

## 2019-11-30 NOTE — Telephone Encounter (Signed)
Bacterial vaginosis is positive. Pt needs treatment. Flagyl 500 mg BID x 7 days #14 no refills sent to patients pharmacy of choice.    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 Pt verbalized understanding and had all questions answered.

## 2020-03-07 ENCOUNTER — Other Ambulatory Visit: Payer: Self-pay

## 2020-03-07 ENCOUNTER — Encounter: Payer: Self-pay | Admitting: Emergency Medicine

## 2020-03-07 ENCOUNTER — Ambulatory Visit
Admission: EM | Admit: 2020-03-07 | Discharge: 2020-03-07 | Disposition: A | Discharge status: Home / Self Care | Payer: BC Managed Care – PPO

## 2020-03-07 DIAGNOSIS — H6591 Unspecified nonsuppurative otitis media, right ear: Secondary | ICD-10-CM | POA: Diagnosis not present

## 2020-03-07 DIAGNOSIS — J302 Other seasonal allergic rhinitis: Secondary | ICD-10-CM | POA: Diagnosis not present

## 2020-03-07 MED ORDER — FLUTICASONE PROPIONATE 50 MCG/ACT NA SUSP: 1.0000 | Freq: Every day | NASAL | 0 refills | Status: DC

## 2020-03-07 MED ORDER — CETIRIZINE-PSEUDOEPHEDRINE ER 5-120 MG PO TB12: 1.0000 | ORAL_TABLET | Freq: Every day | ORAL | 0 refills | Status: DC

## 2020-03-07 NOTE — Discharge Instructions (Signed)
Zyrtec-D was prescribed Flonase was prescribed Rest push fluids Return or follow up with PCP in 24 hours to be reevaluated and to ensure your symptoms are improving Return sooner or go to the ED if you have any new or worsening symptoms such as difficulty breathing, shortness of breath, chest pain, nausea, vomiting, throat tightness or swelling, tongue swelling or tingling, worsening lip or facial swelling, abdominal pain, changes in bowel or bladder habits, no improvement despite medications, etc..Marland Kitchen

## 2020-03-07 NOTE — ED Triage Notes (Signed)
Patient noticed symptoms of not feeling well as soon as she woke at 8 am this morning.  Patient reports pressure sensation on right side of head.  Right ear and right side of head is pressurized.  Patient has had light sensitivity

## 2020-03-07 NOTE — ED Provider Notes (Signed)
[facility]   [id number] 03/07/20 Arrival Time: 1238  Cc: Allergic reaction  SUBJECTIVE:  Jaime Ramos is a 24 y.o. female who presents to the urgent care with a complaint of right ear pressure and right side frontal lobe pain that started this morning.  Denies precipitating event, exposure, known allergy or trigger. Denies changes in medication or starting a new medication.  Has tried Zyrtec without relief.  Nothing make her symptom worse.  Denies previous symptoms in the past.   Denies fever, chills, nausea, vomiting, erythema, redness, swollen glands, oral manifestations such as throat swelling/ tingling, mouth swelling/ tingling, tongue swelling/tingling, dyspnea, SOB, chest pain, abdominal pain, changes in bowel or bladder function.     ROS: As per HPI.  All other pertinent ROS negative.     Past Medical History:  Diagnosis Date  . 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 Anxiety   . Asthma   . Chlamydia   . Depression    doing good  . Diabetes mellitus without complication (HCC)    Pre Diabetic  . Obesity   . Precocious puberty   . Prediabetes   . Trichomonal vaginitis    Past Surgical History:  Procedure Laterality Date  . DILATION AND CURETTAGE OF UTERUS N/A 11/09/2014   Procedure: DILATATION AND CURETTAGE;  Surgeon: Allie Bossier, MD;  Location: [facility];  Service: Gynecology;  Laterality: N/A;  . DILATION AND CURETTAGE OF UTERUS  2016   No Known Allergies No current facility-administered medications on file prior to encounter.   Current Outpatient Medications on File Prior to Encounter  Medication Sig Dispense Refill  . ibuprofen (ADVIL) 800 MG tablet Take 1 tablet (800 mg total) by mouth 3 (three) times daily. Take with food 21 tablet 0    Social History   Socioeconomic History  . Marital status: Single    Spouse name: Not on file  . Number of children: Not on file  . Years of education: Not on file  . Highest education level:  Not on file  Occupational History  . Not on file  Tobacco Use  . Smoking status: Never Smoker  . Smokeless tobacco: Never Used  Substance and Sexual Activity  . Alcohol use: No  . Drug use: No  . Sexual activity: Yes    Birth control/protection: None  Other Topics Concern  . Not on file  Social History Narrative   Lives with mom and step father. Rare visits with biologic dad. 11 th grade [employer] Wants to play softball this year.    Social Determinants of Health   Financial Resource Strain:   . Difficulty of Paying Living Expenses:   Food Insecurity:   . Worried About Programme researcher, broadcasting/film/video in the Last Year:   . Barista in the Last Year:   Transportation Needs:   . Freight forwarder (Medical):   Marland Kitchen Lack of Transportation (Non-Medical):   Physical Activity:   . Days of Exercise per Week:   . Minutes of Exercise per Session:   Stress:   . Feeling of Stress :   Social Connections:   . Frequency of Communication with Friends and Family:   . Frequency of Social Gatherings with Friends and Family:   . Attends Religious Services:   . Active Member of Clubs or Organizations:   . Attends Banker Meetings:   Marland Kitchen Marital Status:   Intimate Partner Violence:   .  Fear of Current or Ex-Partner:   . Emotionally Abused:   Marland Kitchen Physically Abused:   . Sexually Abused:    Family History  Problem Relation Age of Onset  . Diabetes Mother   . Hypertension Mother   . Obesity Mother   . Fibroids Mother   . Cancer Mother        thyroid  . Obesity Father   . Obesity Maternal Aunt   . Fibroids Maternal Aunt   . Obesity Maternal Uncle   . Obesity Paternal Aunt   . Diabetes Maternal Grandmother   . Obesity Maternal Grandmother   . Hypertension Maternal Grandmother   . Diabetes Maternal Grandfather   . Obesity Maternal Grandfather   . Stroke Maternal Grandfather   . Heart disease Maternal Grandfather   . Hypertension Maternal Grandfather   . Obesity  Paternal Grandfather   . Heart disease Paternal Grandfather   . Bipolar disorder Brother   . Bipolar disorder Brother      OBJECTIVE:  Vitals:   03/07/20 1313  BP: 106/72  Pulse: 96  Resp: 18  Temp: 99 F (37.2 C)  TempSrc: Oral  SpO2: 98%     General appearance: Alert, speaking in full sentences without difficulty HEENT:NCAT; no obvious facial swelling; Ears: EACs clear, left ear :TMs pearly gray, right ear: middle ear effusion present; Eyes: PERRL.  EOM grossly intact. Nose: nares patent without rhinorrhea; Throat: tonsils nonerythematous or enlarged, uvula midline Neck: supple without LAD Lungs: clear to auscultation bilaterally without adventitious breath sounds; normal respiratory effort; no labored respirations Heart: regular rate and rhythm.  Radial pulses 2+ symmetrical bilaterally; cap refill < 2 seconds Abdomen: soft, nondistended, normal active bowel sounds; nontender to palpation; no guarding  Skin: warm and dry Psychological: alert and cooperative; normal mood and affect  ASSESSMENT & PLAN:  1. Seasonal allergies   2. Middle ear effusion, right     Meds ordered this encounter  Medications  . cetirizine-pseudoephedrine (ZYRTEC-D) 5-120 MG tablet    Sig: Take 1 tablet by mouth daily.    Dispense:  30 tablet    Refill:  0  . fluticasone (FLONASE) 50 MCG/ACT nasal spray    Sig: Place 1 spray into both nostrils daily for 14 days.    Dispense:  16 g    Refill:  0    No orders of the defined types were placed in this encounter.   Discharge instruction Zyrtec-D was prescribed Flonase was prescribed Rest push fluids Return or follow up with PCP in 24 hours to be reevaluated and to ensure your symptoms are improving Return sooner or go to the ED if you have any new or worsening symptoms such as difficulty breathing, shortness of breath, chest pain, nausea, vomiting, throat tightness or swelling, tongue swelling or tingling, worsening lip or facial swelling,  abdominal pain, changes in bowel or bladder habits, no improvement despite medications, etc...   Reviewed expectations re: course of current medical issues. Questions answered. Outlined signs and symptoms indicating need for more acute intervention. Patient verbalized understanding. After Visit Summary given.          Emerson Monte, [place] 03/07/20 1331

## 2020-03-24 ENCOUNTER — Other Ambulatory Visit: Payer: Self-pay

## 2020-03-24 ENCOUNTER — Ambulatory Visit
Admission: EM | Admit: 2020-03-24 | Discharge: 2020-03-24 | Disposition: A | Discharge status: Home / Self Care | Payer: BC Managed Care – PPO | Attending: Emergency Medicine | Admitting: Emergency Medicine

## 2020-03-24 ENCOUNTER — Encounter: Payer: Self-pay | Admitting: Emergency Medicine

## 2020-03-24 DIAGNOSIS — A6004 Herpesviral vulvovaginitis: Secondary | ICD-10-CM | POA: Diagnosis not present

## 2020-03-24 MED ORDER — VALACYCLOVIR HCL 1 G PO TABS: 1000.0000 mg | ORAL_TABLET | Freq: Every day | ORAL | 0 refills | Status: AC

## 2020-03-24 NOTE — Discharge Instructions (Signed)
Valtrex prescribed.  Take as directed and to completion Follow up with PCP  Return here or go to ER if you have any new or worsening symptoms fever, chills, nausea, vomiting, abdominal or pelvic pain, painful intercourse, vaginal discharge, vaginal bleeding, persistent symptoms despite treatment, etc..Marland Kitchen

## 2020-03-24 NOTE — ED Provider Notes (Signed)
[facility]   [id number] 03/24/20 Arrival Time: 1723   CC: Herpes outbreak  SUBJECTIVE:  Jaidynn Balster is a 24 y.o. female who presents with complaint of genital herpes outbreak x 3 days.  [place] the [place]. Denies alleviating factors.  Worse to the touch.  She reports similar symptoms in the past with herpes outbreak treated with acyclovir.  She denies fever, chills, nausea, vomiting, abdominal or pelvic pain, urinary symptoms, vaginal itching, vaginal odor, vaginal bleeding, dyspareunia, vaginal rashes or lesions.   ROS: As per HPI.  All other pertinent ROS negative.     Past Medical History:  Diagnosis Date  . 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 Anxiety   . Asthma   . Chlamydia   . Depression    doing good  . Diabetes mellitus without complication (HCC)    Pre Diabetic  . Obesity   . Precocious puberty   . Prediabetes   . Trichomonal vaginitis    Past Surgical History:  Procedure Laterality Date  . DILATION AND CURETTAGE OF UTERUS N/A 11/09/2014   Procedure: DILATATION AND CURETTAGE;  Surgeon: Allie Bossier, MD;  Location: [facility];  Service: Gynecology;  Laterality: N/A;  . DILATION AND CURETTAGE OF UTERUS  2016   No Known Allergies No current facility-administered medications on file prior to encounter.   Current Outpatient Medications on File Prior to Encounter  Medication Sig Dispense Refill  . [DISCONTINUED] fluticasone (FLONASE) 50 MCG/ACT nasal spray Place 1 spray into both nostrils daily for 14 days. 16 g 0    Social History   Socioeconomic History  . Marital status: Single    Spouse name: Not on file  . Number of children: Not on file  . Years of education: Not on file  . Highest education level: Not on file  Occupational History  . Not on file  Tobacco Use  . Smoking status: Never Smoker  . Smokeless tobacco: Never Used  Substance and Sexual Activity  . Alcohol use: No  . Drug use: No    . Sexual activity: Yes    Birth control/protection: None  Other Topics Concern  . Not on file  Social History Narrative   Lives with mom and step father. Rare visits with biologic dad. 11 th grade [employer] Wants to play softball this year.    Social Determinants of Health   Financial Resource Strain:   . Difficulty of Paying Living Expenses:   Food Insecurity:   . Worried About Programme researcher, broadcasting/film/video in the Last Year:   . Barista in the Last Year:   Transportation Needs:   . Freight forwarder (Medical):   Marland Kitchen Lack of Transportation (Non-Medical):   Physical Activity:   . Days of Exercise per Week:   . Minutes of Exercise per Session:   Stress:   . Feeling of Stress :   Social Connections:   . Frequency of Communication with Friends and Family:   . Frequency of Social Gatherings with Friends and Family:   . Attends Religious Services:   . Active Member of Clubs or Organizations:   . Attends Banker Meetings:   Marland Kitchen Marital Status:   Intimate Partner Violence:   . Fear of Current or Ex-Partner:   . Emotionally Abused:   Marland Kitchen Physically Abused:   . Sexually Abused:    Family History  Problem Relation Age of Onset  .  Diabetes Mother   . Hypertension Mother   . Obesity Mother   . Fibroids Mother   . Cancer Mother        thyroid  . Obesity Father   . Obesity Maternal Aunt   . Fibroids Maternal Aunt   . Obesity Maternal Uncle   . Obesity Paternal Aunt   . Diabetes Maternal Grandmother   . Obesity Maternal Grandmother   . Hypertension Maternal Grandmother   . Diabetes Maternal Grandfather   . Obesity Maternal Grandfather   . Stroke Maternal Grandfather   . Heart disease Maternal Grandfather   . Hypertension Maternal Grandfather   . Obesity Paternal Grandfather   . Heart disease Paternal Grandfather   . Bipolar disorder Brother   . Bipolar disorder Brother     OBJECTIVE:  Vitals:   03/24/20 1733  BP: (!) 142/81  Pulse: 90  Resp:  18  Temp: 98.6 F (37 C)  TempSrc: Oral  SpO2: 98%    General appearance: Alert, NAD, appears stated age Head: NCAT Throat: lips, mucosa, and tongue normal; teeth and gums normal Lungs: CTA bilaterally without adventitious breath sounds Heart: regular rate and rhythm.   Back: no CVA tenderness Abdomen: soft, non-tender; bowel sounds normal; no guarding GU: deferred Skin: warm and dry Psychological:  Alert and cooperative. Normal mood and affect.  ASSESSMENT & PLAN:  1. Herpes simplex vulvovaginitis    Meds ordered this encounter  Medications  . valACYclovir (VALTREX) 1000 MG tablet    Sig: Take 1 tablet (1,000 mg total) by mouth daily for 5 days.    Dispense:  5 tablet    Refill:  0    Order Specific Question:   Supervising Provider    Answer:   Raylene Everts [[id number]]   Valtrex prescribed.  Take as directed and to completion Follow up with PCP  Return here or go to ER if you have any new or worsening symptoms fever, chills, nausea, vomiting, abdominal or pelvic pain, painful intercourse, vaginal discharge, vaginal bleeding, persistent symptoms despite treatment, etc...  Reviewed expectations re: course of current medical issues. Questions answered. Outlined signs and symptoms indicating need for more acute intervention. Patient verbalized understanding. After Visit Summary given.       Lestine Box, PA-C 03/24/20 1743

## 2020-03-24 NOTE — ED Triage Notes (Signed)
Pt sts right sided labial pain with some swelling and "bumps" noted that she thinks are from herpes outbreak with hx of same

## 2020-06-10 ENCOUNTER — Ambulatory Visit
Admission: EM | Admit: 2020-06-10 | Discharge: 2020-06-10 | Disposition: A | Discharge status: Home / Self Care | Payer: BC Managed Care – PPO | Attending: Emergency Medicine | Admitting: Emergency Medicine

## 2020-06-10 DIAGNOSIS — A6004 Herpesviral vulvovaginitis: Secondary | ICD-10-CM | POA: Diagnosis not present

## 2020-06-10 DIAGNOSIS — N760 Acute vaginitis: Secondary | ICD-10-CM

## 2020-06-10 MED ORDER — VALACYCLOVIR HCL 1 G PO TABS: 1000.0000 mg | ORAL_TABLET | Freq: Two times a day (BID) | ORAL | 0 refills | Status: AC

## 2020-06-10 NOTE — ED Provider Notes (Signed)
[facility]   [id number] 06/10/20 Arrival Time: 1927   GS:UPJSRPR discomfort  SUBJECTIVE:  Jaime Ramos is a 24 y.o. female who presents with complaints of vaginal discomfort x 1 day.  Symptoms began after wearing tight shorts to bed.  Describes pain as intermittent and burning in character.  Localizes symptoms to RT side of groin.  Denies alleviating factors.  Tender to the touch.  Hx significant for genital herpes, unsure if this is an outbreak.  She denies fever, chills, nausea, vomiting, abdominal or pelvic pain, urinary symptoms, vaginal itching, vaginal bleeding, dyspareunia, vaginal rashes or lesions.   No LMP recorded.  ROS: As per HPI.  All other pertinent ROS negative.     Past Medical History:  Diagnosis Date  . 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 Anxiety   . Asthma   . Chlamydia   . Depression    doing good  . Diabetes mellitus without complication (HCC)    Pre Diabetic  . Obesity   . Precocious puberty   . Prediabetes   . Trichomonal vaginitis    Past Surgical History:  Procedure Laterality Date  . DILATION AND CURETTAGE OF UTERUS N/A 11/09/2014   Procedure: DILATATION AND CURETTAGE;  Surgeon: Allie Bossier, MD;  Location: [facility];  Service: Gynecology;  Laterality: N/A;  . DILATION AND CURETTAGE OF UTERUS  2016   No Known Allergies No current facility-administered medications on file prior to encounter.   Current Outpatient Medications on File Prior to Encounter  Medication Sig Dispense Refill  . [DISCONTINUED] fluticasone (FLONASE) 50 MCG/ACT nasal spray Place 1 spray into both nostrils daily for 14 days. 16 g 0    Social History   Socioeconomic History  . Marital status: Single    Spouse name: Not on file  . Number of children: Not on file  . Years of education: Not on file  . Highest education level: Not on file  Occupational History  . Not on file  Tobacco Use  . Smoking status: Never Smoker  . Smokeless  tobacco: Never Used  Vaping Use  . Vaping Use: Never used  Substance and Sexual Activity  . Alcohol use: No  . Drug use: No  . Sexual activity: Yes    Birth control/protection: None  Other Topics Concern  . Not on file  Social History Narrative   Lives with mom and step father. Rare visits with biologic dad. 11 th grade [employer] Wants to play softball this year.    Social Determinants of Health   Financial Resource Strain:   . Difficulty of Paying Living Expenses:   Food Insecurity:   . Worried About Programme researcher, broadcasting/film/video in the Last Year:   . Barista in the Last Year:   Transportation Needs:   . Freight forwarder (Medical):   Marland Kitchen Lack of Transportation (Non-Medical):   Physical Activity:   . Days of Exercise per Week:   . Minutes of Exercise per Session:   Stress:   . Feeling of Stress :   Social Connections:   . Frequency of Communication with Friends and Family:   . Frequency of Social Gatherings with Friends and Family:   . Attends Religious Services:   . Active Member of Clubs or Organizations:   . Attends Banker Meetings:   Marland Kitchen Marital Status:   Intimate Partner Violence:   . Fear of Current or Ex-Partner:   .  Emotionally Abused:   Marland Kitchen Physically Abused:   . Sexually Abused:    Family History  Problem Relation Age of Onset  . Diabetes Mother   . Hypertension Mother   . Obesity Mother   . Fibroids Mother   . Cancer Mother        thyroid  . Obesity Father   . Obesity Maternal Aunt   . Fibroids Maternal Aunt   . Obesity Maternal Uncle   . Obesity Paternal Aunt   . Diabetes Maternal Grandmother   . Obesity Maternal Grandmother   . Hypertension Maternal Grandmother   . Diabetes Maternal Grandfather   . Obesity Maternal Grandfather   . Stroke Maternal Grandfather   . Heart disease Maternal Grandfather   . Hypertension Maternal Grandfather   . Obesity Paternal Grandfather   . Heart disease Paternal Grandfather   . Bipolar  disorder Brother   . Bipolar disorder Brother     OBJECTIVE:  Vitals:   06/10/20 1933  BP: 119/79  Pulse: 84  Resp: 17  Temp: 99 F (37.2 C)  TempSrc: Oral  SpO2: 95%    General appearance: Alert, NAD, appears stated age Head: NCAT Throat: lips, mucosa, and tongue normal; teeth and gums normal Lungs: CTA bilaterally without adventitious breath sounds Heart: regular rate and rhythm.   Back: no CVA tenderness Abdomen: soft, non-tender; bowel sounds normal; no guarding or rebound tenderness GU: External examination positive for two superficial erythematous ulcers to RT side of labia majora, LT labia minora appears enlarged, ulcers TTP, no obvious drainage or bleeding Skin: warm and dry Psychological:  Alert and cooperative. Normal mood and affect.  ASSESSMENT & PLAN:  1. Herpes simplex vulvovaginitis   2. Vaginitis and vulvovaginitis     Meds ordered this encounter  Medications  . valACYclovir (VALTREX) 1000 MG tablet    Sig: Take 1 tablet (1,000 mg total) by mouth 2 (two) times daily for 10 days.    Dispense:  20 tablet    Refill:  0    Order Specific Question:   Supervising Provider    Answer:   Eustace Moore [[id number]]   Valtrex prescribed for possible outbreak.  Take as directed and to completion Follow up with OB/GYN for reevaluation and to ensure symptoms are improving Return here or go to ER if you have any new or worsening symptoms fever, chills, nausea, vomiting, abdominal or pelvic pain, painful intercourse, vaginal discharge, vaginal bleeding, persistent symptoms despite treatment, etc...  Reviewed expectations re: course of current medical issues. Questions answered. Outlined signs and symptoms indicating need for more acute intervention. Patient verbalized understanding. After Visit Summary given.       [name], PA-C 06/10/20 2000

## 2020-06-10 NOTE — ED Triage Notes (Signed)
Provider triage  

## 2020-06-10 NOTE — Discharge Instructions (Signed)
Valtrex prescribed for possible outbreak.  Take as directed and to completion Follow up with OB/GYN for reevaluation and to ensure symptoms are improving Return here or go to ER if you have any new or worsening symptoms fever, chills, nausea, vomiting, abdominal or pelvic pain, painful intercourse, vaginal discharge, vaginal bleeding, persistent symptoms despite treatment, etc..Marland Kitchen

## 2020-06-12 ENCOUNTER — Encounter (HOSPITAL_COMMUNITY): Payer: Self-pay

## 2020-06-12 ENCOUNTER — Other Ambulatory Visit: Payer: Self-pay

## 2020-06-12 ENCOUNTER — Ambulatory Visit (HOSPITAL_COMMUNITY)
Admission: EM | Admit: 2020-06-12 | Discharge: 2020-06-12 | Disposition: A | Discharge status: Home / Self Care | Payer: BC Managed Care – PPO | Attending: Emergency Medicine | Admitting: Emergency Medicine

## 2020-06-12 DIAGNOSIS — R197 Diarrhea, unspecified: Secondary | ICD-10-CM

## 2020-06-12 DIAGNOSIS — R112 Nausea with vomiting, unspecified: Secondary | ICD-10-CM

## 2020-06-12 DIAGNOSIS — R519 Headache, unspecified: Secondary | ICD-10-CM

## 2020-06-12 MED ORDER — ONDANSETRON 4 MG PO TBDP: 4.0000 mg | ORAL_TABLET | Freq: Three times a day (TID) | ORAL | 0 refills | Status: DC | PRN

## 2020-06-12 MED ORDER — NAPROXEN 500 MG PO TABS: 500.0000 mg | ORAL_TABLET | Freq: Two times a day (BID) | ORAL | 0 refills | Status: DC | PRN

## 2020-06-12 NOTE — Discharge Instructions (Signed)
Your nausea, vomiting, and diarrhea appear to have a viral cause. Your symptoms should improve over the next week as your body continues to rid the infectious cause. ° °For nausea: Zofran prescribed. Begin with every 6 hours, than as you are able to hold food down, take it as needed. Start with clear liquids, then move to plain foods like bananas, rice, applesauce, toast, broth, grits, oatmeal. As those food settle okay you may transition to your normal foods. Avoid spicy and greasy foods as much as possible. ° °For Diarrhea: This is your body's natural way of getting rid of a virus. You may try taking 1 imodium or pepto bismol to decrease amount of stools a day, but we do not want you to stop your diarrhea.  ° °Preventing dehydration is key! You need to replace the fluid your body is expelling. Drink plenty of fluids, may use Pedialyte or sports drinks.  ° °Please return if you are experiencing blood in your vomit or stool or experiencing dizziness, lightheadedness, extreme fatigue, increased abdominal pain.  °

## 2020-06-12 NOTE — ED Triage Notes (Signed)
Pt presents with vomiting and headache since waking up this morning.  

## 2020-06-13 NOTE — ED Provider Notes (Signed)
[facility]    CSN: [id number] Arrival date & time: 06/12/20  1726      History   Chief Complaint Chief Complaint  Patient presents with   Vomiting   Headache    HPI Jaime Ramos is a 24 y.o. female no significant past medical history presenting today for evaluation of vomiting and headache.  Patient reports that since this morning she has had nausea and vomiting as well as diarrhea.  She denies any significant associated abdominal pain.  She denies any URI symptoms.  HPI  Past Medical History:  Diagnosis Date   Abnormal Pap smear of cervix 12/20/2016   HSIL will get colpo   Anxiety    Asthma    Chlamydia    Depression    doing good   Diabetes mellitus without complication (HCC)    Pre Diabetic   Obesity    Precocious puberty    Prediabetes    Trichomonal vaginitis     Patient Active Problem List   Diagnosis Date Noted   Trichomoniasis 09/23/2018   Abnormal Pap smear of cervix 12/20/2016   MDD (major depressive disorder), recurrent episode, moderate (HCC) 01/25/2016   Chlamydia 04/27/2015   Nexplanon removal 02/09/2015   Preterm delivery 10/10/2013   Preterm labor 10/09/2013   Depression 10/02/2013   Vaginal bleeding before [redacted] weeks gestation 10/02/2013   Acanthosis 05/28/2012   Menorrhagia with regular cycle 07/31/2011   Goiter 07/31/2011   Prediabetes    Obesity 05/17/2011    Past Surgical History:  Procedure Laterality Date   DILATION AND CURETTAGE OF UTERUS N/A 11/09/2014   Procedure: DILATATION AND CURETTAGE;  Surgeon: Allie Bossier, MD;  Location: [facility];  Service: Gynecology;  Laterality: N/A;   DILATION AND CURETTAGE OF UTERUS  2016    OB History    Gravida  3   Para  1   Term      Preterm  1   AB  2   Living  0     SAB  1   TAB      Ectopic  1   Multiple      Live Births  1            Home Medications    Prior to Admission medications   Medication Sig Start Date End Date  Taking? Authorizing Provider  naproxen (NAPROSYN) 500 MG tablet Take 1 tablet (500 mg total) by mouth 2 (two) times daily as needed for headache. 06/12/20   Joelle Roswell C, PA-C  ondansetron (ZOFRAN ODT) 4 MG disintegrating tablet Take 1 tablet (4 mg total) by mouth every 8 (eight) hours as needed for nausea or vomiting. 06/12/20   Babbie Dondlinger C, PA-C  valACYclovir (VALTREX) 1000 MG tablet Take 1 tablet (1,000 mg total) by mouth 2 (two) times daily for 10 days. 06/10/20 06/20/20  Wurst, Lowanda Shorkey, PA-C  fluticasone (FLONASE) 50 MCG/ACT nasal spray Place 1 spray into both nostrils daily for 14 days. 03/07/20 03/24/20  AvegnoZachery Dakins, FNP    Family History Family History  Problem Relation Age of Onset   Diabetes Mother    Hypertension Mother    Obesity Mother    Fibroids Mother    Cancer Mother        thyroid   Obesity Father    Obesity Maternal Aunt    Fibroids Maternal Aunt    Obesity Maternal Uncle    Obesity Paternal Aunt    Diabetes Maternal Grandmother    Obesity  Maternal Grandmother    Hypertension Maternal Grandmother    Diabetes Maternal Grandfather    Obesity Maternal Grandfather    Stroke Maternal Grandfather    Heart disease Maternal Grandfather    Hypertension Maternal Grandfather    Obesity Paternal Grandfather    Heart disease Paternal Grandfather    Bipolar disorder Brother    Bipolar disorder Brother     Social History Social History   Tobacco Use   Smoking status: Never Smoker   Smokeless tobacco: Never Used  Building services engineer Use: Never used  Substance Use Topics   Alcohol use: No   Drug use: No     Allergies   Patient has no known allergies.   Review of Systems Review of Systems  Constitutional: Negative for activity change, appetite change, chills, fatigue and fever.  HENT: Negative for congestion, ear pain, rhinorrhea, sinus pressure, sore throat and trouble swallowing.   Eyes: Negative for discharge and  redness.  Respiratory: Negative for cough, chest tightness and shortness of breath.   Cardiovascular: Negative for chest pain.  Gastrointestinal: Positive for diarrhea, nausea and vomiting. Negative for abdominal pain.  Musculoskeletal: Negative for myalgias.  Skin: Negative for rash.  Neurological: Positive for headaches. Negative for dizziness and light-headedness.     Physical Exam Triage Vital Signs ED Triage Vitals  Enc Vitals Group     BP 06/12/20 1818 115/61     Pulse Rate 06/12/20 1818 (!) 101     Resp 06/12/20 1818 17     Temp 06/12/20 1818 98.5 F (36.9 C)     Temp Source 06/12/20 1818 Oral     SpO2 06/12/20 1818 100 %     Weight --      Height --      Head Circumference --      Peak Flow --      Pain Score 06/12/20 1822 4     Pain Loc --      Pain Edu? --      Excl. in GC? --    No data found.  Updated Vital Signs BP 115/61 (BP Location: Right Arm)    Pulse (!) 101    Temp 98.5 F (36.9 C) (Oral)    Resp 17    LMP 05/29/2020    SpO2 100%   Visual Acuity Right Eye Distance:   Left Eye Distance:   Bilateral Distance:    Right Eye Near:   Left Eye Near:    Bilateral Near:     Physical Exam Vitals and nursing note reviewed.  Constitutional:      Appearance: She is well-developed.     Comments: No acute distress  HENT:     Head: Normocephalic and atraumatic.     Ears:     Comments: Bilateral ears without tenderness to palpation of external auricle, tragus and mastoid, EAC's without erythema or swelling, TM's with good bony landmarks and cone of light. Non erythematous.     Nose: Nose normal.     Mouth/Throat:     Comments: Oral mucosa pink and moist, no tonsillar enlargement or exudate. Posterior pharynx patent and nonerythematous, no uvula deviation or swelling. Normal phonation.  Eyes:     Conjunctiva/sclera: Conjunctivae normal.  Cardiovascular:     Rate and Rhythm: Normal rate.  Pulmonary:     Effort: Pulmonary effort is normal. No respiratory  distress.     Comments: Breathing comfortably at rest, CTABL, no wheezing, rales or other adventitious sounds auscultated  Abdominal:     General: There is no distension.     Comments: Soft, nondistended, nontender to light and deep palpation throughout abdomen  Musculoskeletal:        General: Normal range of motion.     Cervical back: Neck supple.  Skin:    General: Skin is warm and dry.  Neurological:     Mental Status: She is alert and oriented to person, place, and time.      UC Treatments / Results  Labs (all labs ordered are listed, but only abnormal results are displayed) Labs Reviewed - No data to display  EKG   Radiology No results found.  Procedures Procedures (including critical care time)  Medications Ordered in UC Medications - No data to display  Initial Impression / Assessment and Plan / UC Course  I have reviewed the triage vital signs and the nursing notes.  Pertinent labs & imaging results that were available during my care of the patient were reviewed by me and considered in my medical decision making (see chart for details).     1 day of nausea vomiting and diarrhea without abdominal pain or tenderness.  Associated pain, straining to extreme dehydration.  Patient declined Covid testing.  Recommending symptomatic and supportive care for likely viral etiology.  Rest and fluids.  Discussed strict return precautions. Patient verbalized understanding and is agreeable with plan.  Final Clinical Impressions(s) / UC Diagnoses   Final diagnoses:  Nausea vomiting and diarrhea  Acute nonintractable headache, unspecified headache type     Discharge Instructions     Your nausea, vomiting, and diarrhea appear to have a viral cause. Your symptoms should improve over the next week as your body continues to rid the infectious cause.  For nausea: Zofran prescribed. Begin with every 6 hours, than as you are able to hold food down, take it as needed. Start  with clear liquids, then move to plain foods like bananas, rice, applesauce, toast, broth, grits, oatmeal. As those food settle okay you may transition to your normal foods. Avoid spicy and greasy foods as much as possible.  For Diarrhea: This is your body's natural way of getting rid of a virus. You may try taking 1 imodium or pepto bismol to decrease amount of stools a day, but we do not want you to stop your diarrhea.   Preventing dehydration is key! You need to replace the fluid your body is expelling. Drink plenty of fluids, may use Pedialyte or sports drinks.   Please return if you are experiencing blood in your vomit or stool or experiencing dizziness, lightheadedness, extreme fatigue, increased abdominal pain.    ED Prescriptions    Medication Sig Dispense Auth. Provider   ondansetron (ZOFRAN ODT) 4 MG disintegrating tablet Take 1 tablet (4 mg total) by mouth every 8 (eight) hours as needed for nausea or vomiting. 20 tablet Zyrell Carmean C, PA-C   naproxen (NAPROSYN) 500 MG tablet Take 1 tablet (500 mg total) by mouth 2 (two) times daily as needed for headache. 30 tablet Kimon Loewen, [place] C, PA-C     PDMP not reviewed this encounter.   Sharyon Cable [place] C, PA-C 06/13/20 1029

## 2020-07-12 ENCOUNTER — Ambulatory Visit
Admission: EM | Admit: 2020-07-12 | Discharge: 2020-07-12 | Disposition: A | Discharge status: Home / Self Care | Payer: BC Managed Care – PPO | Attending: Emergency Medicine | Admitting: Emergency Medicine

## 2020-07-12 ENCOUNTER — Other Ambulatory Visit: Payer: Self-pay

## 2020-07-12 DIAGNOSIS — Z1152 Encounter for screening for COVID-19: Secondary | ICD-10-CM | POA: Diagnosis not present

## 2020-07-12 NOTE — ED Triage Notes (Signed)
covid test, no s/s

## 2020-07-13 LAB — NOVEL CORONAVIRUS, NAA: SARS-CoV-2, NAA: NOT DETECTED

## 2020-07-13 LAB — SARS-COV-2, NAA 2 DAY TAT

## 2020-07-24 ENCOUNTER — Ambulatory Visit
Admission: EM | Admit: 2020-07-24 | Discharge: 2020-07-24 | Disposition: A | Discharge status: Home / Self Care | Payer: BC Managed Care – PPO | Attending: Emergency Medicine | Admitting: Emergency Medicine

## 2020-07-24 ENCOUNTER — Other Ambulatory Visit: Payer: Self-pay

## 2020-07-24 DIAGNOSIS — H5789 Other specified disorders of eye and adnexa: Secondary | ICD-10-CM | POA: Diagnosis not present

## 2020-07-24 DIAGNOSIS — Z0289 Encounter for other administrative examinations: Secondary | ICD-10-CM

## 2020-07-24 MED ORDER — HYPROMELLOSE 0.3 % OP SOLN: OPHTHALMIC | 0 refills | Status: DC

## 2020-07-24 NOTE — Discharge Instructions (Signed)
Use eye drops as directed Continue with allergy medication Work note attached Follow up with PCP if symptoms persists such as fever, chills, redness, swelling, eye pain, painful eye movements, vision changes, etc..Marland Kitchen

## 2020-07-24 NOTE — ED Provider Notes (Signed)
[facility]   [id number] 07/24/20 Arrival Time: 1249  CC: Red eye  SUBJECTIVE:  Jaime Ramos is a 24 y.o. female who presents with complaint of eye redness, itching and irritation that began today.  Denies a precipitating event, trauma, or close contacts with similar symptoms.  Has tried OTC allergy medication without relief.  Symptoms are made worse with light.  Denies similar symptoms in the past.  Reports possible yellow drainage.  Denies fever, chills, nausea, vomiting, eye pain, painful eye movements, vision changes, periorbital erythema.     Requests work note.    Denies contact lens use.    ROS: As per HPI.  All other pertinent ROS negative.     Past Medical History:  Diagnosis Date  . 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 Anxiety   . Asthma   . Chlamydia   . Depression    doing good  . Diabetes mellitus without complication (HCC)    Pre Diabetic  . Obesity   . Precocious puberty   . Prediabetes   . Trichomonal vaginitis    Past Surgical History:  Procedure Laterality Date  . DILATION AND CURETTAGE OF UTERUS N/A 11/09/2014   Procedure: DILATATION AND CURETTAGE;  Surgeon: Allie Bossier, MD;  Location: [facility];  Service: Gynecology;  Laterality: N/A;  . DILATION AND CURETTAGE OF UTERUS  2016   No Known Allergies No current facility-administered medications on file prior to encounter.   Current Outpatient Medications on File Prior to Encounter  Medication Sig Dispense Refill  . [DISCONTINUED] fluticasone (FLONASE) 50 MCG/ACT nasal spray Place 1 spray into both nostrils daily for 14 days. 16 g 0   Social History   Socioeconomic History  . Marital status: Single    Spouse name: Not on file  . Number of children: Not on file  . Years of education: Not on file  . Highest education level: Not on file  Occupational History  . Not on file  Tobacco Use  . Smoking status: Never Smoker  . Smokeless tobacco: Never Used  Vaping Use   . Vaping Use: Never used  Substance and Sexual Activity  . Alcohol use: No  . Drug use: No  . Sexual activity: Yes    Birth control/protection: None  Other Topics Concern  . Not on file  Social History Narrative   Lives with mom and step father. Rare visits with biologic dad. 11 th grade [employer] Wants to play softball this year.    Social Determinants of Health   Financial Resource Strain:   . Difficulty of Paying Living Expenses: Not on file  Food Insecurity:   . Worried About Programme researcher, broadcasting/film/video in the Last Year: Not on file  . Ran Out of Food in the Last Year: Not on file  Transportation Needs:   . Lack of Transportation (Medical): Not on file  . Lack of Transportation (Non-Medical): Not on file  Physical Activity:   . Days of Exercise per Week: Not on file  . Minutes of Exercise per Session: Not on file  Stress:   . Feeling of Stress : Not on file  Social Connections:   . Frequency of Communication with Friends and Family: Not on file  . Frequency of Social Gatherings with Friends and Family: Not on file  . Attends Religious Services: Not on file  . Active Member of Clubs or Organizations: Not on file  . Attends Club or  Organization Meetings: Not on file  . Marital Status: Not on file  Intimate Partner Violence:   . Fear of Current or Ex-Partner: Not on file  . Emotionally Abused: Not on file  . Physically Abused: Not on file  . Sexually Abused: Not on file   Family History  Problem Relation Age of Onset  . Diabetes Mother   . Hypertension Mother   . Obesity Mother   . Fibroids Mother   . Cancer Mother        thyroid  . Obesity Father   . Obesity Maternal Aunt   . Fibroids Maternal Aunt   . Obesity Maternal Uncle   . Obesity Paternal Aunt   . Diabetes Maternal Grandmother   . Obesity Maternal Grandmother   . Hypertension Maternal Grandmother   . Diabetes Maternal Grandfather   . Obesity Maternal Grandfather   . Stroke Maternal Grandfather     . Heart disease Maternal Grandfather   . Hypertension Maternal Grandfather   . Obesity Paternal Grandfather   . Heart disease Paternal Grandfather   . Bipolar disorder Brother   . Bipolar disorder Brother     OBJECTIVE:     Vitals:   07/24/20 1333  BP: 103/72  Pulse: 81  Resp: 20  Temp: 99.4 F (37.4 C)  SpO2: 97%    General appearance: alert; no distress Eyes: Eyes appear normal, no obvious redness, swelling or drainage; No conjunctival erythema. PERRL; EOMI without discomfort;  no obvious drainage ENT: nares patent; oropharynx clear Neck: supple Lungs: normal respiratory effort Skin: warm and dry Psychological: alert and cooperative; normal mood and affect   ASSESSMENT & PLAN:  1. Irritation of both eyes   2. Encounter to obtain excuse from work     Meds ordered this encounter  Medications  . Hypromellose 0.3 % SOLN    Sig: Use as needed for irritation    Dispense:  30 mL    Refill:  0    Order Specific Question:   Supervising Provider    Answer:   Eustace Moore [[id number]]    Use eye drops as directed Continue with allergy medication Work note attached Follow up with PCP if symptoms persists such as fever, chills, redness, swelling, eye pain, painful eye movements, vision changes, etc...  Reviewed expectations re: course of current medical issues. Questions answered. Outlined signs and symptoms indicating need for more acute intervention. Patient verbalized understanding. After Visit Summary given.   [name], PA-C 07/24/20 1352

## 2020-07-24 NOTE — ED Triage Notes (Signed)
Pt states left eye is red and irritated since yesterday , no redness noted upon assessment

## 2020-09-30 ENCOUNTER — Ambulatory Visit
Admission: EM | Admit: 2020-09-30 | Discharge: 2020-09-30 | Disposition: A | Discharge status: Home / Self Care | Payer: BC Managed Care – PPO | Attending: Family Medicine | Admitting: Family Medicine

## 2020-09-30 ENCOUNTER — Encounter: Payer: Self-pay | Admitting: Emergency Medicine

## 2020-09-30 DIAGNOSIS — B009 Herpesviral infection, unspecified: Secondary | ICD-10-CM

## 2020-09-30 HISTORY — DX: Herpesviral infection of urogenital system, unspecified: A60.00

## 2020-09-30 MED ORDER — VALACYCLOVIR HCL 1 G PO TABS: 1000.0000 mg | ORAL_TABLET | Freq: Three times a day (TID) | ORAL | 0 refills | Status: AC

## 2020-09-30 NOTE — ED Triage Notes (Signed)
Pt reports she is having an outbreak of genital herpes x 3 days ago. Needs medication sent in to pharmacy.

## 2020-09-30 NOTE — ED Provider Notes (Signed)
[facility]    CSN: [id number] Arrival date & time: 09/30/20  1829      History   Chief Complaint Chief Complaint  Patient presents with  . SEXUALLY TRANSMITTED DISEASE    HPI Jaime Ramos is a 24 y.o. female.   Reports that she is currently having genital  HSV outbreak. Reports that this started about 2 days ago. Has not attempted OTC treatment. There are not aggravating or alleviating factors. Denies other symptoms.  ROS per HPI  The history is provided by the patient.    Past Medical History:  Diagnosis Date  . 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 Anxiety   . Asthma   . Chlamydia   . Depression    doing good  . Diabetes mellitus without complication (HCC)    Pre Diabetic  . Genital herpes   . Obesity   . Precocious puberty   . Prediabetes   . Trichomonal vaginitis     Patient Active Problem List   Diagnosis Date Noted  . Trichomoniasis 09/23/2018  . Abnormal Pap smear of cervix 12/20/2016  . MDD (major depressive disorder), recurrent episode, moderate (HCC) 01/25/2016  . Chlamydia 04/27/2015  . Nexplanon removal 02/09/2015  . Preterm delivery 10/10/2013  . Preterm labor 10/09/2013  . Depression 10/02/2013  . Vaginal bleeding before [redacted] weeks gestation 10/02/2013  . Acanthosis 05/28/2012  . Menorrhagia with regular cycle 07/31/2011  . Goiter 07/31/2011  . Prediabetes   . Obesity 05/17/2011    Past Surgical History:  Procedure Laterality Date  . DILATION AND CURETTAGE OF UTERUS N/A 11/09/2014   Procedure: DILATATION AND CURETTAGE;  Surgeon: Allie Bossier, MD;  Location: [facility];  Service: Gynecology;  Laterality: N/A;  . DILATION AND CURETTAGE OF UTERUS  2016    OB History    Gravida  3   Para  1   Term      Preterm  1   AB  2   Living  0     SAB  1   TAB      Ectopic  1   Multiple      Live Births  1            Home Medications    Prior to Admission medications   Medication Sig  Start Date End Date Taking? Authorizing Provider  Hypromellose 0.3 % SOLN Use as needed for irritation 07/24/20   Wurst, [place], PA-C  valACYclovir (VALTREX) 1000 MG tablet Take 1 tablet (1,000 mg total) by mouth 3 (three) times daily for 7 days. 09/30/20 10/07/20  Moshe Cipro, NP  fluticasone (FLONASE) 50 MCG/ACT nasal spray Place 1 spray into both nostrils daily for 14 days. 03/07/20 03/24/20  Durward Parcel, FNP    Family History Family History  Problem Relation Age of Onset  . Diabetes Mother   . Hypertension Mother   . Obesity Mother   . Fibroids Mother   . Cancer Mother        thyroid  . Obesity Father   . Obesity Maternal Aunt   . Fibroids Maternal Aunt   . Obesity Maternal Uncle   . Obesity Paternal Aunt   . Diabetes Maternal Grandmother   . Obesity Maternal Grandmother   . Hypertension Maternal Grandmother   . Diabetes Maternal Grandfather   . Obesity Maternal Grandfather   . Stroke Maternal Grandfather   . Heart disease Maternal Grandfather   . Hypertension Maternal Grandfather   .  Obesity Paternal Grandfather   . Heart disease Paternal Grandfather   . Bipolar disorder Brother   . Bipolar disorder Brother     Social History Social History   Tobacco Use  . Smoking status: Never Smoker  . Smokeless tobacco: Never Used  Vaping Use  . Vaping Use: Never used  Substance Use Topics  . Alcohol use: No  . Drug use: No     Allergies   Patient has no known allergies.   Review of Systems Review of Systems   Physical Exam Triage Vital Signs ED Triage Vitals  Enc Vitals Group     BP 09/30/20 1916 112/72     Pulse Rate 09/30/20 1916 90     Resp 09/30/20 1916 19     Temp 09/30/20 1916 99 F (37.2 C)     Temp Source 09/30/20 1916 Oral     SpO2 09/30/20 1916 98 %     Weight 09/30/20 1918 280 lb (127 kg)     Height 09/30/20 1918 5\' 6"  (1.676 m)     Head Circumference --      Peak Flow --      Pain Score 09/30/20 1918 0     Pain Loc --      Pain  Edu? --      Excl. in GC? --    No data found.  Updated Vital Signs BP 112/72 (BP Location: Right Arm)   Pulse 90   Temp 99 F (37.2 C) (Oral)   Resp 19   Ht 5\' 6"  (1.676 m)   Wt 280 lb (127 kg)   LMP 09/20/2020   SpO2 98%   BMI 45.19 kg/m   Visual Acuity Right Eye Distance:   Left Eye Distance:   Bilateral Distance:    Right Eye Near:   Left Eye Near:    Bilateral Near:     Physical Exam Vitals and nursing note reviewed.  Constitutional:      General: She is not in acute distress.    Appearance: Normal appearance. She is well-developed. She is not ill-appearing.  HENT:     Head: Normocephalic and atraumatic.  Eyes:     Conjunctiva/sclera: Conjunctivae normal.  Cardiovascular:     Rate and Rhythm: Normal rate and regular rhythm.     Heart sounds: Normal heart sounds. No murmur heard.   Pulmonary:     Effort: Pulmonary effort is normal. No respiratory distress.     Breath sounds: Normal breath sounds.  Abdominal:     Palpations: Abdomen is soft.     Tenderness: There is no abdominal tenderness.  Genitourinary:    Comments: Deferred  Musculoskeletal:        General: Normal range of motion.     Cervical back: Normal range of motion and neck supple.  Skin:    General: Skin is warm and dry.     Capillary Refill: Capillary refill takes less than 2 seconds.  Neurological:     General: No focal deficit present.     Mental Status: She is alert and oriented to person, place, and time.  Psychiatric:        Mood and Affect: Mood normal.        Behavior: Behavior normal.        Thought Content: Thought content normal.      UC Treatments / Results  Labs (all labs ordered are listed, but only abnormal results are displayed) Labs Reviewed - No data to display  EKG  Radiology No results found.  Procedures Procedures (including critical care time)  Medications Ordered in UC Medications - No data to display  Initial Impression / Assessment and Plan /  UC Course  I have reviewed the triage vital signs and the nursing notes.  Pertinent labs & imaging results that were available during my care of the patient were reviewed by me and considered in my medical decision making (see chart for details).    HSV infection  Prescribed Valtrex 1000mg  TID x 7 days Follow up with this office or with PCP if symptoms persist   Final Clinical Impressions(s) / UC Diagnoses   Final diagnoses:  HSV (herpes simplex virus) infection     Discharge Instructions     I have sent in valacyclovir for you to take 3 times a day for 7 days  Follow up with this office or with primary care if symptoms are persisting.  Follow up in the ER for high fever, trouble swallowing, trouble breathing, other concerning symptoms.     ED Prescriptions    Medication Sig Dispense Auth. Provider   valACYclovir (VALTREX) 1000 MG tablet Take 1 tablet (1,000 mg total) by mouth 3 (three) times daily for 7 days. 21 tablet , NP     PDMP not reviewed this encounter.   Moshe Cipro, NP 10/03/20 1356

## 2020-09-30 NOTE — Discharge Instructions (Signed)
I have sent in valacyclovir for you to take 3 times a day for 7 days  Follow up with this office or with primary care if symptoms are persisting.  Follow up in the ER for high fever, trouble swallowing, trouble breathing, other concerning symptoms.

## 2020-11-06 ENCOUNTER — Other Ambulatory Visit: Payer: Self-pay

## 2020-11-06 ENCOUNTER — Ambulatory Visit
Admission: EM | Admit: 2020-11-06 | Discharge: 2020-11-06 | Disposition: A | Discharge status: Home / Self Care | Payer: BC Managed Care – PPO | Attending: Family Medicine | Admitting: Family Medicine

## 2020-11-06 DIAGNOSIS — Z1152 Encounter for screening for COVID-19: Secondary | ICD-10-CM

## 2020-11-07 NOTE — ED Provider Notes (Signed)
Here for covid test an d note   Eustace Moore, MD 11/07/20 [phone]

## 2020-11-10 LAB — NOVEL CORONAVIRUS, NAA

## 2020-11-18 ENCOUNTER — Encounter: Payer: Self-pay | Admitting: Emergency Medicine

## 2020-11-18 ENCOUNTER — Ambulatory Visit
Admission: EM | Admit: 2020-11-18 | Discharge: 2020-11-18 | Disposition: A | Discharge status: Home / Self Care | Payer: Self-pay | Attending: Emergency Medicine | Admitting: Emergency Medicine

## 2020-11-18 ENCOUNTER — Other Ambulatory Visit: Payer: Self-pay

## 2020-11-18 DIAGNOSIS — J038 Acute tonsillitis due to other specified organisms: Secondary | ICD-10-CM | POA: Insufficient documentation

## 2020-11-18 LAB — POCT RAPID STREP A (OFFICE): Rapid Strep A Screen: NEGATIVE

## 2020-11-18 MED ORDER — AMOXICILLIN 500 MG PO CAPS: 500.0000 mg | ORAL_CAPSULE | Freq: Two times a day (BID) | ORAL | 0 refills | Status: AC

## 2020-11-18 NOTE — ED Provider Notes (Signed)
[facility]   [id number] 11/18/20 Arrival Time: 1935  [id number] THROAT  SUBJECTIVE: History from: patient.  Jaime Ramos is a 25 y.o. female who presents with abrupt onset of sore throat for the 3 weeks.  Denies sick exposure to strep, flu or mono, or precipitating event.  Has tried OTC medication without relief.  Symptoms are made worse with swallowing, but tolerating liquids and own secretions without difficulty.  Denies previous symptoms in the past.   Denies fever, chills, fatigue, ear pain, sinus pain, rhinorrhea, nasal congestion, cough, SOB, wheezing, chest pain, nausea, rash, changes in bowel or bladder habits.      ROS: As per HPI.  All other pertinent ROS negative.     Past Medical History:  Diagnosis Date  . 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 Anxiety   . Asthma   . Chlamydia   . Depression    doing good  . Diabetes mellitus without complication (HCC)    Pre Diabetic  . Genital herpes   . Obesity   . Precocious puberty   . Prediabetes   . Trichomonal vaginitis    Past Surgical History:  Procedure Laterality Date  . DILATION AND CURETTAGE OF UTERUS N/A 11/09/2014   Procedure: DILATATION AND CURETTAGE;  Surgeon: Allie Bossier, MD;  Location: [facility];  Service: Gynecology;  Laterality: N/A;  . DILATION AND CURETTAGE OF UTERUS  2016   No Known Allergies No current facility-administered medications on file prior to encounter.   Current Outpatient Medications on File Prior to Encounter  Medication Sig Dispense Refill  . Hypromellose 0.3 % SOLN Use as needed for irritation 30 mL 0  . [DISCONTINUED] fluticasone (FLONASE) 50 MCG/ACT nasal spray Place 1 spray into both nostrils daily for 14 days. 16 g 0   Social History   Socioeconomic History  . Marital status: Single    Spouse name: Not on file  . Number of children: Not on file  . Years of education: Not on file  . Highest education level: Not on file  Occupational History   . Not on file  Tobacco Use  . Smoking status: Never Smoker  . Smokeless tobacco: Never Used  Vaping Use  . Vaping Use: Never used  Substance and Sexual Activity  . Alcohol use: No  . Drug use: No  . Sexual activity: Yes    Birth control/protection: None  Other Topics Concern  . Not on file  Social History Narrative   Lives with mom and step father. Rare visits with biologic dad. 11 th grade [employer] Wants to play softball this year.    Social Determinants of Health   Financial Resource Strain: Not on file  Food Insecurity: Not on file  Transportation Needs: Not on file  Physical Activity: Not on file  Stress: Not on file  Social Connections: Not on file  Intimate Partner Violence: Not on file   Family History  Problem Relation Age of Onset  . Diabetes Mother   . Hypertension Mother   . Obesity Mother   . Fibroids Mother   . Cancer Mother        thyroid  . Obesity Father   . Obesity Maternal Aunt   . Fibroids Maternal Aunt   . Obesity Maternal Uncle   . Obesity Paternal Aunt   . Diabetes Maternal Grandmother   . Obesity Maternal Grandmother   . Hypertension Maternal Grandmother   . Diabetes Maternal Grandfather   .  Obesity Maternal Grandfather   . Stroke Maternal Grandfather   . Heart disease Maternal Grandfather   . Hypertension Maternal Grandfather   . Obesity Paternal Grandfather   . Heart disease Paternal Grandfather   . Bipolar disorder Brother   . Bipolar disorder Brother     OBJECTIVE:  Vitals:   11/18/20 1953 11/18/20 1954  BP: 126/80   Pulse: 95   Resp: 18   Temp: 99.1 F (37.3 C)   TempSrc: Oral   SpO2: 98%   Weight:  280 lb (127 kg)  Height:  5\' 6"  (1.676 m)     General appearance: alert; appears fatigued, but nontoxic, speaking in full sentences and managing own secretions HEENT: NCAT; Ears: EACs clear, TMs pearly gray with visible cone of light, without erythema; Eyes: PERRL, EOMI grossly; Nose: no obvious rhinorrhea;  Throat: oropharynx clear, tonsils 2+ and mildly erythematous with white tonsillar exudates, uvula midline Neck: supple without LAD Lungs: CTA bilaterally without adventitious breath sounds; cough absent Heart: regular rate and rhythm.  Radial pulses 2+ symmetrical bilaterally Skin: warm and dry Psychological: alert and cooperative; normal mood and affect  LABS: Results for orders placed or performed during the hospital encounter of 11/18/20 (from the past 24 hour(s))  POCT rapid strep A     Status: None   Collection Time: 11/18/20  7:56 PM  Result Value Ref Range   Rapid Strep A Screen Negative Negative     ASSESSMENT & PLAN:  1. Acute tonsillitis due to other specified organisms     Meds ordered this encounter  Medications  . amoxicillin (AMOXIL) 500 MG capsule    Sig: Take 1 capsule (500 mg total) by mouth 2 (two) times daily for 10 days.    Dispense:  20 capsule    Refill:  0    Discharge instructions  Strep test negative, will send out for culture and we will call you with results Amoxicillin was prescribed/take as directed Get plenty of rest and push fluids Use throat lozenges/cough, Cepacol or Vicks to soothe throat Drink warm or cool liquids, use throat lozenges, or popsicles to help alleviate symptoms Take OTC ibuprofen or tylenol as needed for pain Follow up with PCP if symptoms persists Return or go to ER if patient has any new or worsening symptoms such as fever, chills, nausea, vomiting, worsening sore throat, cough, abdominal pain, chest pain, changes in bowel or bladder habits, etc...  Reviewed expectations re: course of current medical issues. Questions answered. Outlined signs and symptoms indicating need for more acute intervention. Patient verbalized understanding. After Visit Summary given.        11/20/20, FNP 11/18/20 2023

## 2020-11-18 NOTE — Discharge Instructions (Addendum)
Strep test negative, will send out for culture and we will call you with results Amoxicillin was prescribed/take as directed Get plenty of rest and push fluids Use throat lozenges/cough, Cepacol or Vicks to soothe throat Drink warm or cool liquids, use throat lozenges, or popsicles to help alleviate symptoms Take OTC ibuprofen or tylenol as needed for pain Follow up with PCP if symptoms persists Return or go to ER if patient has any new or worsening symptoms such as fever, chills, nausea, vomiting, worsening sore throat, cough, abdominal pain, chest pain, changes in bowel or bladder habits, etc..Marland Kitchen

## 2020-11-18 NOTE — ED Triage Notes (Signed)
Sore throat since Christmas states she has white patches in throat

## 2020-11-22 LAB — CULTURE, GROUP A STREP (THRC)

## 2020-12-22 ENCOUNTER — Inpatient Hospital Stay (HOSPITAL_COMMUNITY)
Admission: AD | Admit: 2020-12-22 | Discharge: 2020-12-22 | Disposition: A | Discharge status: Home / Self Care | Payer: BC Managed Care – PPO | Attending: Obstetrics and Gynecology | Admitting: Obstetrics and Gynecology

## 2020-12-22 ENCOUNTER — Other Ambulatory Visit: Payer: Self-pay

## 2020-12-22 DIAGNOSIS — Z3202 Encounter for pregnancy test, result negative: Secondary | ICD-10-CM | POA: Diagnosis not present

## 2020-12-22 DIAGNOSIS — N926 Irregular menstruation, unspecified: Secondary | ICD-10-CM | POA: Insufficient documentation

## 2020-12-22 LAB — HCG, QUANTITATIVE, PREGNANCY: hCG, Beta Chain, Quant, S: 1 m[IU]/mL (ref <5)

## 2020-12-22 LAB — POCT PREGNANCY, URINE: Preg Test, Ur: NEGATIVE

## 2020-12-22 NOTE — MAU Note (Signed)
Pt stated she has not had a period since December 20. C/o back pain and nausea off and on x 1 month. Denies any vag bleeding or discharge. Took pregnancy test at home faint line seen. Not sure if it is accurate.

## 2020-12-22 NOTE — MAU Provider Note (Signed)
Event Date/Time   First Provider Initiated Contact with Patient 12/22/20 1440      [name] is a 25 y.o. [phone] patient who presents to MAU today with complaint of possible pregnancy. Patient reports LMP in December 2021 and came in to MAU today because she was concerned she might be pregnant. Patient took a pregnancy test at home and saw a "faint line" a few minutes after completing the test. Patient denies pain or bleeding.  O BP 123/69   Pulse (!) 106   Temp 98.4 F (36.9 C)   Resp 18   Ht 5\' 6"  (1.676 m)   Wt 132 kg   LMP 10/24/2020   BMI 46.97 kg/m    Patient Vitals for the past 24 hrs:  BP Temp Pulse Resp Height Weight  12/22/20 1421 123/69 98.4 F (36.9 C) (!) 106 18 5\' 6"  (1.676 m) 132 kg   Physical Exam Vitals and nursing note reviewed.  Constitutional:      General: She is not in acute distress.    Appearance: Normal appearance. She is not ill-appearing, toxic-appearing or diaphoretic.  HENT:     Head: Normocephalic and atraumatic.  Pulmonary:     Effort: Pulmonary effort is normal.  Neurological:     Mental Status: She is alert and oriented to person, place, and time.  Psychiatric:        Mood and Affect: Mood normal.        Behavior: Behavior normal.        Thought Content: Thought content normal.        Judgment: Judgment normal.    A Medical screening exam complete Pregnancy test negative in MAU HCG: 1  P Discharge from MAU in stable condition Pt called at home and identified by two identifiers, given results that hCG shows non-pregnant status, recommended patient see OB/GYN for f/u on irregular periods List of options for follow-up given in MyChart Warning signs for worsening condition that would warrant emergency follow-up discussed Patient may return to MAU as needed   [name], 12/24/20, NP 12/22/2020 2:44 PM

## 2020-12-22 NOTE — Discharge Instructions (Signed)
Prenatal Care Providers           [facility] - accepts patients without insurance  Phone: [phone]  [facility] @ [facility]   Phone: [phone]  [facility]       Phone: [phone]            [facility] @ [place]     Phone: [phone]          [facility] @ [place]   Phone: [phone]  [facility] @ [facility] - accepts patients without insurance  Phone: [phone]  [facility] @ [facility] Phone: [phone]     [facility] - accepts patients without insurance Phone: [phone]  [facility]  Phone: [phone]  [facility] Phone: [phone]  [facility] Phone: [phone]  [facility] Phone: [phone]  [facility] Phone: [phone]  [facility]  Phone: [phone]         

## 2021-01-26 ENCOUNTER — Ambulatory Visit
Admission: RE | Admit: 2021-01-26 | Discharge: 2021-01-26 | Disposition: A | Discharge status: Home / Self Care | Payer: BC Managed Care – PPO | Source: Ambulatory Visit | Attending: Emergency Medicine | Admitting: Emergency Medicine

## 2021-01-26 ENCOUNTER — Other Ambulatory Visit: Payer: Self-pay

## 2021-01-26 VITALS — BP 133/83 | HR 107 | Temp 98.7°F | Resp 16

## 2021-01-26 DIAGNOSIS — A6004 Herpesviral vulvovaginitis: Secondary | ICD-10-CM

## 2021-01-26 MED ORDER — VALACYCLOVIR HCL 1 G PO TABS: 1000.0000 mg | ORAL_TABLET | Freq: Every day | ORAL | 0 refills | Status: AC

## 2021-01-26 NOTE — Discharge Instructions (Signed)
Valtrex prescribed Follow up with PCP for further evaluation and management Return here or go to ER if you have any new or worsening symptoms fever, chills, nausea, vomiting, abdominal or pelvic pain, painful intercourse, vaginal discharge, vaginal bleeding, persistent symptoms despite treatment, etc..Marland Kitchen

## 2021-01-26 NOTE — ED Provider Notes (Signed)
[facility]   [id number] 01/26/21 Arrival Time: 1843   CC: Herpes outbreak  SUBJECTIVE:  Jaime Ramos is a 25 y.o. female who presents with complaints of genital herpes outbreak x 1 day.  Symptoms began after starting her menstrual cycle.  Denies alleviating or aggravating factors.  She reports similar symptoms in the past.  She denies fever, chills, nausea, vomiting, abdominal or pelvic pain, urinary symptoms, vaginal itching, vaginal odor, vaginal bleeding, dyspareunia.  No LMP recorded.  ROS: As per HPI.  All other pertinent ROS negative.     Past Medical History:  Diagnosis Date  . 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 Anxiety   . Asthma   . Chlamydia   . Depression    doing good  . Diabetes mellitus without complication (HCC)    Pre Diabetic  . Genital herpes   . Obesity   . Precocious puberty   . Prediabetes   . Trichomonal vaginitis    Past Surgical History:  Procedure Laterality Date  . DILATION AND CURETTAGE OF UTERUS N/A 11/09/2014   Procedure: DILATATION AND CURETTAGE;  Surgeon: Allie Bossier, MD;  Location: [facility];  Service: Gynecology;  Laterality: N/A;  . DILATION AND CURETTAGE OF UTERUS  2016   No Known Allergies No current facility-administered medications on file prior to encounter.   Current Outpatient Medications on File Prior to Encounter  Medication Sig Dispense Refill  . Hypromellose 0.3 % SOLN Use as needed for irritation 30 mL 0  . [DISCONTINUED] fluticasone (FLONASE) 50 MCG/ACT nasal spray Place 1 spray into both nostrils daily for 14 days. 16 g 0    Social History   Socioeconomic History  . Marital status: Single    Spouse name: Not on file  . Number of children: Not on file  . Years of education: Not on file  . Highest education level: Not on file  Occupational History  . Not on file  Tobacco Use  . Smoking status: Never Smoker  . Smokeless tobacco: Never Used  Vaping Use  . Vaping Use: Never  used  Substance and Sexual Activity  . Alcohol use: No  . Drug use: No  . Sexual activity: Yes    Birth control/protection: None  Other Topics Concern  . Not on file  Social History Narrative   Lives with mom and step father. Rare visits with biologic dad. 11 th grade [employer] Wants to play softball this year.    Social Determinants of Health   Financial Resource Strain: Not on file  Food Insecurity: Not on file  Transportation Needs: Not on file  Physical Activity: Not on file  Stress: Not on file  Social Connections: Not on file  Intimate Partner Violence: Not on file   Family History  Problem Relation Age of Onset  . Diabetes Mother   . Hypertension Mother   . Obesity Mother   . Fibroids Mother   . Cancer Mother        thyroid  . Obesity Father   . Obesity Maternal Aunt   . Fibroids Maternal Aunt   . Obesity Maternal Uncle   . Obesity Paternal Aunt   . Diabetes Maternal Grandmother   . Obesity Maternal Grandmother   . Hypertension Maternal Grandmother   . Diabetes Maternal Grandfather   . Obesity Maternal Grandfather   . Stroke Maternal Grandfather   . Heart disease Maternal Grandfather   . Hypertension Maternal Grandfather   .  Obesity Paternal Grandfather   . Heart disease Paternal Grandfather   . Bipolar disorder Brother   . Bipolar disorder Brother     OBJECTIVE:  Vitals:   01/26/21 1850  BP: 133/83  Pulse: (!) 107  Resp: 16  Temp: 98.7 F (37.1 C)  TempSrc: Tympanic  SpO2: 93%    General appearance: Alert, NAD, appears stated age Head: NCAT Throat: lips, mucosa, and tongue normal; teeth and gums normal Lungs: CTA bilaterally without adventitious breath sounds Heart: regular rate and rhythm.  Abdomen: soft, non-tender; bowel sounds normal; no guarding GU: deferred Skin: warm and dry Psychological:  Alert and cooperative. Normal mood and affect.  ASSESSMENT & PLAN:  1. Herpes simplex vulvovaginitis    Meds ordered this  encounter  Medications  . valACYclovir (VALTREX) 1000 MG tablet    Sig: Take 1 tablet (1,000 mg total) by mouth daily for 5 days.    Dispense:  5 tablet    Refill:  0    Order Specific Question:   Supervising Provider    Answer:   Eustace Moore [[id number]]   Valtrex prescribed Follow up with PCP for further evaluation and management Return here or go to ER if you have any new or worsening symptoms fever, chills, nausea, vomiting, abdominal or pelvic pain, painful intercourse, vaginal discharge, vaginal bleeding, persistent symptoms despite treatment, etc...  Reviewed expectations re: course of current medical issues. Questions answered. Outlined signs and symptoms indicating need for more acute intervention. Patient verbalized understanding. After Visit Summary given.       [name], PA-C 01/26/21 1858

## 2021-01-26 NOTE — ED Triage Notes (Signed)
Herpes outbreak.  Started yesterday

## 2021-02-13 ENCOUNTER — Ambulatory Visit
Admission: RE | Admit: 2021-02-13 | Discharge: 2021-02-13 | Disposition: A | Discharge status: Home / Self Care | Payer: BC Managed Care – PPO | Source: Ambulatory Visit | Attending: Family Medicine | Admitting: Family Medicine

## 2021-02-13 ENCOUNTER — Other Ambulatory Visit: Payer: Self-pay

## 2021-02-13 VITALS — BP 133/87 | HR 102 | Temp 99.3°F | Resp 19

## 2021-02-13 DIAGNOSIS — J039 Acute tonsillitis, unspecified: Secondary | ICD-10-CM | POA: Insufficient documentation

## 2021-02-13 LAB — POCT RAPID STREP A (OFFICE): Rapid Strep A Screen: NEGATIVE

## 2021-02-13 MED ORDER — AMOXICILLIN 875 MG PO TABS: 875.0000 mg | ORAL_TABLET | Freq: Two times a day (BID) | ORAL | 0 refills | Status: AC

## 2021-02-13 MED ORDER — PREDNISONE 20 MG PO TABS: 20.0000 mg | ORAL_TABLET | Freq: Every day | ORAL | 0 refills | Status: AC

## 2021-02-13 NOTE — ED Provider Notes (Signed)
[facility]    CSN: [id number] Arrival date & time: 02/13/21  1355      History   Chief Complaint Chief Complaint  Patient presents with  . Sore Throat    HPI Jaime Ramos is a 25 y.o. female.   HPI  Sore throat most specifically right tonsils pain and swelling. Pain with swallowing. Worse today. Enlarge right sided lymph nodes which are painful. No know fever. tyelnol for throat pain. Past Medical History:  Diagnosis Date  . 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 Anxiety   . Asthma   . Chlamydia   . Depression    doing good  . Diabetes mellitus without complication (HCC)    Pre Diabetic  . Genital herpes   . Obesity   . Precocious puberty   . Prediabetes   . Trichomonal vaginitis     Patient Active Problem List   Diagnosis Date Noted  . Trichomoniasis 09/23/2018  . Abnormal Pap smear of cervix 12/20/2016  . MDD (major depressive disorder), recurrent episode, moderate (HCC) 01/25/2016  . Chlamydia 04/27/2015  . Nexplanon removal 02/09/2015  . Preterm delivery 10/10/2013  . Preterm labor 10/09/2013  . Depression 10/02/2013  . Vaginal bleeding before [redacted] weeks gestation 10/02/2013  . Acanthosis 05/28/2012  . Menorrhagia with regular cycle 07/31/2011  . Goiter 07/31/2011  . Prediabetes   . Obesity 05/17/2011    Past Surgical History:  Procedure Laterality Date  . DILATION AND CURETTAGE OF UTERUS N/A 11/09/2014   Procedure: DILATATION AND CURETTAGE;  Surgeon: Allie Bossier, MD;  Location: [facility];  Service: Gynecology;  Laterality: N/A;  . DILATION AND CURETTAGE OF UTERUS  2016    OB History    Gravida  3   Para  1   Term      Preterm  1   AB  2   Living  0     SAB  1   IAB      Ectopic  1   Multiple      Live Births  1            Home Medications    Prior to Admission medications   Medication Sig Start Date End Date Taking? Authorizing Provider  amoxicillin (AMOXIL) 875 MG tablet Take 1  tablet (875 mg total) by mouth 2 (two) times daily for 7 days. 02/13/21 02/20/21 Yes Bing Neighbors, FNP  predniSONE (DELTASONE) 20 MG tablet Take 1 tablet (20 mg total) by mouth daily for 5 days. 02/13/21 02/18/21 Yes Bing Neighbors, FNP  Hypromellose 0.3 % SOLN Use as needed for irritation 07/24/20   Wurst, [place], PA-C  fluticasone (FLONASE) 50 MCG/ACT nasal spray Place 1 spray into both nostrils daily for 14 days. 03/07/20 03/24/20  Durward Parcel, FNP    Family History Family History  Problem Relation Age of Onset  . Diabetes Mother   . Hypertension Mother   . Obesity Mother   . Fibroids Mother   . Cancer Mother        thyroid  . Obesity Father   . Obesity Maternal Aunt   . Fibroids Maternal Aunt   . Obesity Maternal Uncle   . Obesity Paternal Aunt   . Diabetes Maternal Grandmother   . Obesity Maternal Grandmother   . Hypertension Maternal Grandmother   . Diabetes Maternal Grandfather   . Obesity Maternal Grandfather   . Stroke Maternal Grandfather   . Heart disease  Maternal Grandfather   . Hypertension Maternal Grandfather   . Obesity Paternal Grandfather   . Heart disease Paternal Grandfather   . Bipolar disorder Brother   . Bipolar disorder Brother     Social History Social History   Tobacco Use  . Smoking status: Never Smoker  . Smokeless tobacco: Never Used  Vaping Use  . Vaping Use: Never used  Substance Use Topics  . Alcohol use: No  . Drug use: No     Allergies   Patient has no known allergies.   Review of Systems Review of Systems Pertinent negatives listed in HPI  Physical Exam Triage Vital Signs ED Triage Vitals  Enc Vitals Group     BP 02/13/21 1439 133/87     Pulse Rate 02/13/21 1439 (!) 102     Resp 02/13/21 1439 19     Temp 02/13/21 1439 99.3 F (37.4 C)     Temp Source 02/13/21 1439 Oral     SpO2 02/13/21 1439 97 %     Weight --      Height --      Head Circumference --      Peak Flow --      Pain Score 02/13/21 1438 8      Pain Loc --      Pain Edu? --      Excl. in GC? --    No data found.  Updated Vital Signs BP 133/87 (BP Location: Right Arm)   Pulse (!) 102   Temp 99.3 F (37.4 C) (Oral)   Resp 19   SpO2 97%   Visual Acuity Right Eye Distance:   Left Eye Distance:   Bilateral Distance:    Right Eye Near:   Left Eye Near:    Bilateral Near:     Physical Exam General Appearance:    Alert, cooperative, no distress  HENT:   External ears and nares normal.Neck has right anterior cervical nodes enlarged, pharynx erythematous without exudate and tonsils red, enlarged +3, with exudate present  Eyes:    PERRL, conjunctiva/corneas clear, EOM's intact       Lungs:     Clear to auscultation bilaterally, respirations unlabored  Heart:    Tachycardia dnd normal rhythm  Neurologic:   Awake, alert, oriented x 3. No apparent focal neurological           defect.        UC Treatments / Results  Labs (all labs ordered are listed, but only abnormal results are displayed) Labs Reviewed  CULTURE, GROUP A STREP [facility])  POCT RAPID STREP A (OFFICE)    EKG   Radiology No results found.  Procedures Procedures (including critical care time)  Medications Ordered in UC Medications - No data to display  Initial Impression / Assessment and Plan / UC Course  I have reviewed the triage vital signs and the nursing notes.  Pertinent labs & imaging results that were available during my care of the patient were reviewed by me and considered in my medical decision making (see chart for details).    Acute tonsillitis right > left Treatment prednisone 20 mg daily x 5 days Amoxicillin 975 mg twice daily x10 days,. Strict ER/PCpP precautions.  Final Clinical Impressions(s) / UC Diagnoses   Final diagnoses:  Acute tonsillitis, unspecified etiology   Discharge Instructions   None    ED Prescriptions    Medication Sig Dispense Auth. Provider   predniSONE (DELTASONE) 20 MG tablet Take 1 tablet (20 mg  total) by mouth daily for 5 days. 5 tablet Bing Neighbors, FNP   amoxicillin (AMOXIL) 875 MG tablet Take 1 tablet (875 mg total) by mouth 2 (two) times daily for 7 days. 14 tablet Bing Neighbors, FNP     PDMP not reviewed this encounter.   Bing Neighbors, FNP 02/14/21 0000

## 2021-02-13 NOTE — ED Triage Notes (Signed)
Tonsils inflamed, sore throat and headache for past couple of days.

## 2021-02-16 LAB — CULTURE, GROUP A STREP (THRC)

## 2021-04-04 ENCOUNTER — Ambulatory Visit
Admission: EM | Admit: 2021-04-04 | Discharge: 2021-04-04 | Disposition: A | Discharge status: Home / Self Care | Payer: BC Managed Care – PPO | Attending: Family Medicine | Admitting: Family Medicine

## 2021-04-04 DIAGNOSIS — J069 Acute upper respiratory infection, unspecified: Secondary | ICD-10-CM

## 2021-04-04 DIAGNOSIS — J209 Acute bronchitis, unspecified: Secondary | ICD-10-CM

## 2021-04-04 DIAGNOSIS — J029 Acute pharyngitis, unspecified: Secondary | ICD-10-CM | POA: Diagnosis not present

## 2021-04-04 LAB — POCT RAPID STREP A (OFFICE): Rapid Strep A Screen: NEGATIVE

## 2021-04-04 MED ORDER — ALBUTEROL SULFATE HFA 108 (90 BASE) MCG/ACT IN AERS
2.0000 | INHALATION_SPRAY | Freq: Once | RESPIRATORY_TRACT | Status: AC
  Administered 2021-04-04: 2 via RESPIRATORY_TRACT

## 2021-04-04 MED ORDER — BENZONATATE 100 MG PO CAPS: 100.0000 mg | ORAL_CAPSULE | Freq: Three times a day (TID) | ORAL | 0 refills | Status: DC

## 2021-04-04 MED ORDER — AZITHROMYCIN 250 MG PO TABS: 250.0000 mg | ORAL_TABLET | Freq: Every day | ORAL | 0 refills | Status: DC

## 2021-04-04 MED ORDER — METHYLPREDNISOLONE SODIUM SUCC 125 MG IJ SOLR
125.0000 mg | Freq: Once | INTRAMUSCULAR | Status: AC
  Administered 2021-04-04: 125 mg via INTRAMUSCULAR

## 2021-04-04 NOTE — Discharge Instructions (Addendum)
I have sent in azithromycin for you to take. Take 2 tablets today, then one tablet daily for the next 4 days.  I have sent in an albuterol inhaler for you to use 2 puffs every 4-6 hours as needed for cough, shortness of breath, wheezing.  I have sent in tessalon perles for you to use one capsule every 8 hours as needed for cough.  Follow up with this office or with primary care if symptoms are persisting.  Follow up in the ER for high fever, trouble swallowing, trouble breathing, other concerning symptoms.

## 2021-04-04 NOTE — ED Provider Notes (Signed)
[facility]   [id number] 04/04/21 Arrival Time: 1323   CC: COVID symptoms  SUBJECTIVE: History from: patient.  Jaime Ramos is a 25 y.o. female who presents with cough, sore throat and headache for the last week with wheezing. Denies sick exposure to COVID, flu or strep. Denies recent travel. Has positive history of Covid. Has completed Covid vaccines. Has taken OTC medications for this without relief.  Symptoms are worse with activity. Denies previous symptoms in the past. Denies fever, chills, fatigue, sinus pain, rhinorrhea, changes in bowel or bladder habits.    ROS: As per HPI.  All other pertinent ROS negative.     Past Medical History:  Diagnosis Date  . 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 Anxiety   . Asthma   . Chlamydia   . Depression    doing good  . Diabetes mellitus without complication (HCC)    Pre Diabetic  . Genital herpes   . Obesity   . Precocious puberty   . Prediabetes   . Trichomonal vaginitis    Past Surgical History:  Procedure Laterality Date  . DILATION AND CURETTAGE OF UTERUS N/A 11/09/2014   Procedure: DILATATION AND CURETTAGE;  Surgeon: Allie Bossier, MD;  Location: [facility];  Service: Gynecology;  Laterality: N/A;  . DILATION AND CURETTAGE OF UTERUS  2016   No Known Allergies No current facility-administered medications on file prior to encounter.   Current Outpatient Medications on File Prior to Encounter  Medication Sig Dispense Refill  . Hypromellose 0.3 % SOLN Use as needed for irritation 30 mL 0  . [DISCONTINUED] fluticasone (FLONASE) 50 MCG/ACT nasal spray Place 1 spray into both nostrils daily for 14 days. 16 g 0   Social History   Socioeconomic History  . Marital status: Single    Spouse name: Not on file  . Number of children: Not on file  . Years of education: Not on file  . Highest education level: Not on file  Occupational History  . Not on file  Tobacco Use  . Smoking status: Never  Smoker  . Smokeless tobacco: Never Used  Vaping Use  . Vaping Use: Never used  Substance and Sexual Activity  . Alcohol use: No  . Drug use: No  . Sexual activity: Yes    Birth control/protection: None  Other Topics Concern  . Not on file  Social History Narrative   Lives with mom and step father. Rare visits with biologic dad. 11 th grade [employer] Wants to play softball this year.    Social Determinants of Health   Financial Resource Strain: Not on file  Food Insecurity: Not on file  Transportation Needs: Not on file  Physical Activity: Not on file  Stress: Not on file  Social Connections: Not on file  Intimate Partner Violence: Not on file   Family History  Problem Relation Age of Onset  . Diabetes Mother   . Hypertension Mother   . Obesity Mother   . Fibroids Mother   . Cancer Mother        thyroid  . Obesity Father   . Obesity Maternal Aunt   . Fibroids Maternal Aunt   . Obesity Maternal Uncle   . Obesity Paternal Aunt   . Diabetes Maternal Grandmother   . Obesity Maternal Grandmother   . Hypertension Maternal Grandmother   . Diabetes Maternal Grandfather   . Obesity Maternal Grandfather   . Stroke Maternal Grandfather   .  Heart disease Maternal Grandfather   . Hypertension Maternal Grandfather   . Obesity Paternal Grandfather   . Heart disease Paternal Grandfather   . Bipolar disorder Brother   . Bipolar disorder Brother     OBJECTIVE:  Vitals:   04/04/21 1446 04/04/21 1448  BP:  119/82  Pulse:  93  Temp:  98.7 F (37.1 C)  TempSrc:  Oral  SpO2:  97%  Weight: 280 lb (127 kg)   Height: 5\' 6"  (1.676 m)      General appearance: alert; appears fatigued, but nontoxic; speaking in full sentences and tolerating own secretions HEENT: NCAT; Ears: EACs clear, TMs pearly gray; Eyes: PERRL.  EOM grossly intact. Sinuses: nontender; Nose: nares patent with clear rhinorrhea, Throat: oropharynx erythematous, cobblestoning present, tonsils non  erythematous or enlarged, uvula midline  Neck: supple without LAD Lungs: unlabored respirations, symmetrical air entry; cough: moderate; no respiratory distress; diffuse wheezing bilaterally Heart: regular rate and rhythm.  Radial pulses 2+ symmetrical bilaterally Skin: warm and dry Psychological: alert and cooperative; normal mood and affect  LABS:  Results for orders placed or performed during the hospital encounter of 04/04/21 (from the past 24 hour(s))  POCT rapid strep A     Status: None   Collection Time: 04/04/21  3:00 PM  Result Value Ref Range   Rapid Strep A Screen Negative Negative     ASSESSMENT & PLAN:  1. Upper respiratory tract infection, unspecified type   2. Sore throat   3. Acute bronchitis, unspecified organism     Meds ordered this encounter  Medications  . azithromycin (ZITHROMAX) 250 MG tablet    Sig: Take 1 tablet (250 mg total) by mouth daily. Take first 2 tablets together, then 1 every day until finished.    Dispense:  6 tablet    Refill:  0    Order Specific Question:   Supervising Provider    Answer:   04/06/21 Merrilee Jansky  . benzonatate (TESSALON) 100 MG capsule    Sig: Take 1 capsule (100 mg total) by mouth every 8 (eight) hours.    Dispense:  21 capsule    Refill:  0    Order Specific Question:   Supervising Provider    Answer:   [id number] Merrilee Jansky  . methylPREDNISolone sodium succinate (SOLU-MEDROL) 125 mg/2 mL injection 125 mg  . albuterol (VENTOLIN HFA) 108 (90 Base) MCG/ACT inhaler 2 puff    Your rapid strep test is negative.  A throat culture is pending; we will call you if it is positive requiring treatment.   Azithromycin prescribed for URI Benzonatate prescribed as needed cough Solu-Medrol 125 mg IM in office today Albuterol inhaler given in office today Continue supportive care at home COVID and flu testing ordered.  It will take between 2-3 days for test results. Someone will contact you regarding abnormal results.    Work note provided Patient should remain in quarantine until they have received Covid results.  If negative you may resume normal activities (go back to work/school) while practicing hand hygiene, social distance, and mask wearing.  If positive, patient should remain in quarantine for at least 5 days from symptom onset AND greater than 72 hours after symptoms resolution (absence of fever without the use of fever-reducing medication and improvement in respiratory symptoms), whichever is longer Get plenty of rest and push fluids Use OTC zyrtec for nasal congestion, runny nose, and/or sore throat Use OTC flonase for nasal congestion and runny nose Use medications daily for  symptom relief Use OTC medications like ibuprofen or tylenol as needed fever or pain Call or go to the ED if you have any new or worsening symptoms such as fever, worsening cough, shortness of breath, chest tightness, chest pain, turning blue, changes in mental status.  Reviewed expectations re: course of current medical issues. Questions answered. Outlined signs and symptoms indicating need for more acute intervention. Patient verbalized understanding. After Visit Summary given.         Moshe Cipro, NP 04/04/21 1512

## 2021-04-04 NOTE — ED Triage Notes (Signed)
Pt is vaccinated. Pt states that she has a cough, sore throat and headache. x1 week.

## 2021-04-05 LAB — COVID-19, FLU A+B NAA
Influenza A, NAA: NOT DETECTED
Influenza B, NAA: NOT DETECTED
SARS-CoV-2, NAA: NOT DETECTED

## 2021-08-01 ENCOUNTER — Other Ambulatory Visit: Payer: Self-pay | Admitting: Obstetrics and Gynecology

## 2021-08-02 ENCOUNTER — Encounter (HOSPITAL_BASED_OUTPATIENT_CLINIC_OR_DEPARTMENT_OTHER): Payer: Self-pay | Admitting: Obstetrics and Gynecology

## 2021-08-02 ENCOUNTER — Emergency Department (HOSPITAL_BASED_OUTPATIENT_CLINIC_OR_DEPARTMENT_OTHER)
Admission: EM | Admit: 2021-08-02 | Discharge: 2021-08-02 | Disposition: A | Discharge status: Home / Self Care | Payer: BC Managed Care – PPO | Attending: Emergency Medicine | Admitting: Emergency Medicine

## 2021-08-02 ENCOUNTER — Encounter (HOSPITAL_BASED_OUTPATIENT_CLINIC_OR_DEPARTMENT_OTHER): Payer: Self-pay | Admitting: Emergency Medicine

## 2021-08-02 ENCOUNTER — Other Ambulatory Visit: Payer: Self-pay

## 2021-08-02 DIAGNOSIS — N760 Acute vaginitis: Secondary | ICD-10-CM | POA: Diagnosis not present

## 2021-08-02 DIAGNOSIS — Z8616 Personal history of COVID-19: Secondary | ICD-10-CM | POA: Insufficient documentation

## 2021-08-02 DIAGNOSIS — J45909 Unspecified asthma, uncomplicated: Secondary | ICD-10-CM | POA: Insufficient documentation

## 2021-08-02 DIAGNOSIS — B9689 Other specified bacterial agents as the cause of diseases classified elsewhere: Secondary | ICD-10-CM | POA: Insufficient documentation

## 2021-08-02 DIAGNOSIS — R102 Pelvic and perineal pain: Secondary | ICD-10-CM | POA: Diagnosis present

## 2021-08-02 LAB — WET PREP, GENITAL
Sperm: NONE SEEN
Trich, Wet Prep: NONE SEEN
Yeast Wet Prep HPF POC: NONE SEEN

## 2021-08-02 LAB — URINALYSIS, ROUTINE W REFLEX MICROSCOPIC
Bilirubin Urine: NEGATIVE
Glucose, UA: NEGATIVE mg/dL
Ketones, ur: NEGATIVE mg/dL
Nitrite: NEGATIVE
Protein, ur: 30 mg/dL — AB
Specific Gravity, Urine: 1.025 (ref 1.005–1.030)
pH: 6.5 (ref 5.0–8.0)

## 2021-08-02 LAB — PREGNANCY, URINE: Preg Test, Ur: NEGATIVE

## 2021-08-02 MED ORDER — OXYCODONE-ACETAMINOPHEN 5-325 MG PO TABS
1.0000 | ORAL_TABLET | ORAL | Status: DC | PRN
  Administered 2021-08-02: 1 via ORAL
  Filled 2021-08-02: qty 1

## 2021-08-02 MED ORDER — HYDROCODONE-ACETAMINOPHEN 5-325 MG PO TABS: 2.0000 | ORAL_TABLET | ORAL | 0 refills | Status: DC | PRN

## 2021-08-02 MED ORDER — METRONIDAZOLE 500 MG PO TABS: 500.0000 mg | ORAL_TABLET | Freq: Two times a day (BID) | ORAL | 0 refills | Status: DC

## 2021-08-02 NOTE — ED Provider Notes (Signed)
[facility] EMERGENCY DEPT Provider Note   CSN: [id number] Arrival date & time: 08/02/21  1730     History Chief Complaint  Patient presents with   Pelvic Pain    Jaime Ramos is a 25 y.o. female who presents to the emergency department with a 1 week history of constant sharp right-sided pelvic pain.  She states that she had a saline ultrasound performed a week ago secondary to vaginal bleeding and was found to have numerous polyps within the uterus.  She is scheduled for a hysteroscopy and subsequent polypectomy a week from 9/29.  However since the ultrasound she has had increasingly sharp cramping.  Unrelieved with Tylenol.  She denies any nausea, vomiting, diarrhea, vaginal discharge, vaginal odor, urinary complaints, abdominal pain, fever, and chills.  She does report intermittent vaginal spotting since Saturday.   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 08/02/2021   Depression    doing good   Genital herpes    Obesity    Pre-diabetes    Precocious puberty    Trichomonal vaginitis    Wears glasses     Patient Active Problem List   Diagnosis Date Noted   Trichomoniasis 09/23/2018   Abnormal Pap smear of cervix 12/20/2016   MDD (major depressive disorder), recurrent episode, moderate (HCC) 01/25/2016   Chlamydia 04/27/2015   Nexplanon removal 02/09/2015   Preterm delivery 10/10/2013   Preterm labor 10/09/2013   Depression 10/02/2013   Vaginal bleeding before [redacted] weeks gestation 10/02/2013   Acanthosis 05/28/2012   Menorrhagia with regular cycle 07/31/2011   Goiter 07/31/2011   Prediabetes    Obesity 05/17/2011    Past Surgical History:  Procedure Laterality Date   DILATION AND CURETTAGE OF UTERUS N/A 11/09/2014   Procedure: DILATATION AND CURETTAGE;  Surgeon: Allie Bossier, MD;  Location: [facility];  Service: Gynecology;  Laterality: N/A;     OB History     Gravida  3   Para  1   Term      Preterm  1   AB  2   Living  0      SAB  1   IAB      Ectopic  1   Multiple      Live Births  1           Family History  Problem Relation Age of Onset   Diabetes Mother    Hypertension Mother    Obesity Mother    Fibroids Mother    Cancer Mother        thyroid   Obesity Father    Obesity Maternal Aunt    Fibroids Maternal Aunt    Obesity Maternal Uncle    Obesity Paternal Aunt    Diabetes Maternal Grandmother    Obesity Maternal Grandmother    Hypertension Maternal Grandmother    Diabetes Maternal Grandfather    Obesity Maternal Grandfather    Stroke Maternal Grandfather    Heart disease Maternal Grandfather    Hypertension Maternal Grandfather    Obesity Paternal Grandfather    Heart disease Paternal Grandfather    Bipolar disorder Brother    Bipolar disorder Brother     Social History   Tobacco  Use   Smoking status: Never   Smokeless tobacco: Never  Vaping Use   Vaping Use: Never used  Substance Use Topics   Alcohol use: No   Drug use: No    Home Medications Prior to Admission medications   Medication Sig Start Date End Date Taking? Authorizing Provider  HYDROcodone-acetaminophen (NORCO/VICODIN) 5-325 MG tablet Take 2 tablets by mouth every 4 (four) hours as needed. 08/02/21  Yes Meredeth Ide, Niamya Vittitow M, PA-C  metroNIDAZOLE (FLAGYL) 500 MG tablet Take 1 tablet (500 mg total) by mouth 2 (two) times daily. 08/02/21  Yes Meredeth Ide, Rendon Howell M, PA-C  albuterol (PROVENTIL) (2.5 MG/3ML) 0.083% nebulizer solution Take 2.5 mg by nebulization as needed for wheezing or shortness of breath. As needed for her asthma. 08/02/2021    [provider]  azithromycin (ZITHROMAX) 250 MG tablet Take 1 tablet (250 mg total) by mouth daily. Take first 2 tablets together, then 1 every day until finished. 04/04/21   Moshe Cipro, NP   benzonatate (TESSALON) 100 MG capsule Take 1 capsule (100 mg total) by mouth every 8 (eight) hours. 04/04/21   Moshe Cipro, NP  buPROPion (ZYBAN) 150 MG 12 hr tablet Take 150 mg by mouth 2 (two) times daily.    [provider]  Hypromellose 0.3 % SOLN Use as needed for irritation 07/24/20   Wurst, [place], PA-C  norethindrone-ethinyl estradiol (CYCLAFEM) 0.5/0.75/1-35 MG-MCG tablet Take 1 tablet by mouth daily.    [provider]  ondansetron (ZOFRAN) 4 MG tablet Take 4 mg by mouth as needed for nausea or vomiting. For nausea    [provider]  fluticasone (FLONASE) 50 MCG/ACT nasal spray Place 1 spray into both nostrils daily for 14 days. 03/07/20 03/24/20  Durward Parcel, FNP    Allergies    Patient has no known allergies.  Review of Systems   Review of Systems  Genitourinary:  Positive for pelvic pain.  All other systems reviewed and are negative.  Physical Exam Updated Vital Signs BP (!) 105/57   Pulse 80   Temp 99.5 F (37.5 C) (Oral)   Resp 20   Ht 5\' 6"  (1.676 m)   Wt 134.7 kg   LMP 06/16/2021   SpO2 100%   BMI 47.94 kg/m   Physical Exam Exam conducted with a chaperone present.  Constitutional:      General: She is not in acute distress.    Appearance: Normal appearance.  HENT:     Head: Normocephalic and atraumatic.  Eyes:     General:        Right eye: No discharge.        Left eye: No discharge.  Cardiovascular:     Comments: Regular rate and rhythm.  S1/S2 are distinct without any evidence of murmur, rubs, or gallops.  Radial pulses are 2+ bilaterally.  Dorsalis pedis pulses are 2+ bilaterally.  No evidence of pedal edema. Pulmonary:     Comments: Clear to auscultation bilaterally.  Normal effort.  No respiratory distress.  No evidence of wheezes, rales, or rhonchi heard throughout. Abdominal:     General: Abdomen is flat. Bowel sounds are normal. There is no distension.     Tenderness: There is no abdominal tenderness.  There is no guarding or rebound.  Genitourinary:    Exam position: Lithotomy position.     Pubic Area: No rash.      Labia:        Right: No rash, tenderness or lesion.  Left: No rash, tenderness or lesion.      Comments: Vaginal canal was normal without any signs of erythema or significant discharge.  Cervix did not appear friable.  It was difficult to appreciate the entire cervix secondary to body habitus.  There is no significant drainage from the cervix.  Was minimal amounts of bleeding.  There was moderate right adnexal tenderness.  There was mild left adnexal tenderness.  No cervical motion tenderness Musculoskeletal:        General: Normal range of motion.     Cervical back: Neck supple.  Skin:    General: Skin is warm and dry.     Findings: No rash.  Neurological:     General: No focal deficit present.     Mental Status: She is alert.  Psychiatric:        Mood and Affect: Mood normal.        Behavior: Behavior normal.    ED Results / Procedures / Treatments   Labs (all labs ordered are listed, but only abnormal results are displayed) Labs Reviewed  WET PREP, GENITAL - Abnormal; Notable for the following components:      Result Value   Clue Cells Wet Prep HPF POC PRESENT (*)    WBC, Wet Prep HPF POC FEW (*)    All other components within normal limits  URINALYSIS, ROUTINE W REFLEX MICROSCOPIC - Abnormal; Notable for the following components:   APPearance HAZY (*)    Hgb urine dipstick LARGE (*)    Protein, ur 30 (*)    Leukocytes,Ua MODERATE (*)    All other components within normal limits  PREGNANCY, URINE  GC/CHLAMYDIA PROBE AMP ([place]) NOT AT [facility]    EKG None  Radiology No results found.  Procedures Procedures   Medications Ordered in ED Medications  oxyCODONE-acetaminophen (PERCOCET/ROXICET) 5-325 MG per tablet 1 tablet (1 tablet Oral Given 08/02/21 1752)    ED Course  I have reviewed the triage vital signs and the nursing  notes.  Pertinent labs & imaging results that were available during my care of the patient were reviewed by me and considered in my medical decision making (see chart for details).    MDM Rules/Calculators/A&P                          Chamaine Stankus is a 25 y.o. female who presents the emergency department for for evaluation of right-sided pelvic pain.  Her pain is likely secondary to the ultrasound that was performed a week ago.  She states that she is not currently sexually active and thus possible PID is less likely.  Gonorrhea and Chlamydia are pending and if positive will treat accordingly.  Wet prep showed clue cells which could be from the saline ultrasound is performed a week ago.  I will treat her with metronidazole.  Her pain was adequately controlled in the emergency department today.  I will give her a short course of Vicodin in the interim until she has her procedure next week for breakthrough pain.  Otherwise I will have her use Ibuprofen as needed for pain.   Final Clinical Impression(s) / ED Diagnoses Final diagnoses:  Bacterial vaginosis    Rx / DC Orders ED Discharge Orders          Ordered    HYDROcodone-acetaminophen (NORCO/VICODIN) 5-325 MG tablet  Every 4 hours PRN        08/02/21 2206    metroNIDAZOLE (FLAGYL) 500  MG tablet  2 times daily        08/02/21 2206             Honor Loh [place], [place] 08/02/21 2216    Derwood Kaplan, MD 08/03/21 [id number]

## 2021-08-02 NOTE — Progress Notes (Signed)
Spoke w/ via phone for pre-op interview---pt  Lab needs dos----   I stat and EKG, UPT           Lab results------n/a COVID test -----patient states asymptomatic no test needed Arrive at -------0530 08/10/2021 NPO after MN NO Solid Food.  Clear liquids from MN until---0430 08/10/2021 Med rec completed Medications to take morning of surgery -----Norethinedrone, zofran, Zyzal and albuteral inhale Diabetic medication -----n/a Patient instructed no nail polish to be worn day of surgery Patient instructed to bring photo id and insurance card day of surgery Patient aware to have Driver (ride ) / caregiver Casimiro Needle    for 24 hours after surgery  Patient Special Instructions -----none Pre-Op special Istructions -----none Patient verbalized understanding of instructions that were given at this phone interview. Patient denies shortness of breath, chest pain, fever, cough at this phone interview.

## 2021-08-02 NOTE — Discharge Instructions (Addendum)
You were seen and evaluated in the emergency department today for further evaluation of pelvic pain.  As we discussed, your wet prep showed bacterial vaginosis.  Gonorrhea and Chlamydia tests were sent out and are pending.  We will treat for bacterial vaginosis today and add on appropriate antibiotic therapy if the other tests come back positive.  Please use ibuprofen for pain.  I have also given you Vicodin for breakthrough pain.  Please do not operate heavy machinery or mix with alcohol.  Do not mix the Metronidazole or Vicodin with alcohol.   Please keep your appointment with your OB/GYN for next week and follow-up with them.  Please turn to the emergency department if you are experiencing worsening pain, new fevers, chills, severe abdominal pain, nausea, vomiting, or any other concerns you may have.

## 2021-08-02 NOTE — ED Triage Notes (Signed)
Reports having [place] last Thursday.  Found polyps inside uterus.  Is scheduled for a procedure next Thursday.  Reports having continuous pain since having [place].  Was not given anything for pain.  Was told the pain would stop after the [place].  No bleeding reported.

## 2021-08-03 LAB — GC/CHLAMYDIA PROBE AMP (~~LOC~~) NOT AT ARMC
Chlamydia: NEGATIVE
Comment: NEGATIVE
Comment: NORMAL
Neisseria Gonorrhea: NEGATIVE

## 2021-08-09 NOTE — Anesthesia Preprocedure Evaluation (Addendum)
Anesthesia Evaluation  Patient identified by MRN, date of birth, ID band Patient awake    Reviewed: Allergy & Precautions, NPO status , Patient's Chart, lab work & pertinent test results  History of Anesthesia Complications Negative for: history of anesthetic complications  Airway Mallampati: II  TM Distance: >3 FB Neck ROM: Full    Dental no notable dental hx.    Pulmonary asthma ,    Pulmonary exam normal        Cardiovascular negative cardio ROS Normal cardiovascular exam     Neuro/Psych Anxiety Depression negative neurological ROS     GI/Hepatic negative GI ROS, Neg liver ROS,   Endo/Other  Morbid obesity (BMI 48)  Renal/GU negative Renal ROS  negative genitourinary   Musculoskeletal negative musculoskeletal ROS (+)   Abdominal   Peds  Hematology negative hematology ROS (+)   Anesthesia Other Findings Day of surgery medications reviewed with patient.  Reproductive/Obstetrics Abnormal Uterine Bleeding, Endometrial Polyps                            Anesthesia Physical Anesthesia Plan  ASA: 3  Anesthesia Plan: General   Post-op Pain Management:    Induction: Intravenous  PONV Risk Score and Plan: 3 and Treatment may vary due to age or medical condition, Ondansetron, Dexamethasone, Midazolam and Scopolamine patch - Pre-op  Airway Management Planned: LMA  Additional Equipment: None  Intra-op Plan:   Post-operative Plan: Extubation in OR  Informed Consent: I have reviewed the patients History and Physical, chart, labs and discussed the procedure including the risks, benefits and alternatives for the proposed anesthesia with the patient or authorized representative who has indicated his/her understanding and acceptance.     Dental advisory given  Plan Discussed with: CRNA  Anesthesia Plan Comments:        Anesthesia Quick Evaluation

## 2021-08-10 ENCOUNTER — Ambulatory Visit (HOSPITAL_BASED_OUTPATIENT_CLINIC_OR_DEPARTMENT_OTHER): Payer: BC Managed Care – PPO | Admitting: Anesthesiology

## 2021-08-10 ENCOUNTER — Other Ambulatory Visit: Payer: Self-pay

## 2021-08-10 ENCOUNTER — Encounter (HOSPITAL_BASED_OUTPATIENT_CLINIC_OR_DEPARTMENT_OTHER): Payer: Self-pay | Admitting: Obstetrics and Gynecology

## 2021-08-10 ENCOUNTER — Encounter (HOSPITAL_BASED_OUTPATIENT_CLINIC_OR_DEPARTMENT_OTHER)
Admission: RE | Disposition: A | Discharge status: Home / Self Care | Payer: Self-pay | Source: Home / Self Care | Attending: Obstetrics and Gynecology

## 2021-08-10 ENCOUNTER — Ambulatory Visit (HOSPITAL_BASED_OUTPATIENT_CLINIC_OR_DEPARTMENT_OTHER)
Admission: RE | Admit: 2021-08-10 | Discharge: 2021-08-10 | Disposition: A | Discharge status: Home / Self Care | Payer: BC Managed Care – PPO | Attending: Obstetrics and Gynecology | Admitting: Obstetrics and Gynecology

## 2021-08-10 DIAGNOSIS — N840 Polyp of corpus uteri: Secondary | ICD-10-CM | POA: Insufficient documentation

## 2021-08-10 DIAGNOSIS — Z793 Long term (current) use of hormonal contraceptives: Secondary | ICD-10-CM | POA: Diagnosis not present

## 2021-08-10 DIAGNOSIS — N939 Abnormal uterine and vaginal bleeding, unspecified: Secondary | ICD-10-CM | POA: Diagnosis present

## 2021-08-10 DIAGNOSIS — Z8616 Personal history of COVID-19: Secondary | ICD-10-CM | POA: Insufficient documentation

## 2021-08-10 DIAGNOSIS — Z79899 Other long term (current) drug therapy: Secondary | ICD-10-CM | POA: Diagnosis not present

## 2021-08-10 HISTORY — DX: Prediabetes: R73.03

## 2021-08-10 HISTORY — PX: DILATATION & CURETTAGE/HYSTEROSCOPY WITH MYOSURE: SHX6511

## 2021-08-10 HISTORY — DX: Presence of spectacles and contact lenses: Z97.3

## 2021-08-10 HISTORY — DX: COVID-19: U07.1

## 2021-08-10 LAB — TYPE AND SCREEN
ABO/RH(D): O POS
Antibody Screen: NEGATIVE

## 2021-08-10 LAB — CBC
HCT: 37.4 % (ref 36.0–46.0)
Hemoglobin: 11.3 g/dL — ABNORMAL LOW (ref 12.0–15.0)
MCH: 23.4 pg — ABNORMAL LOW (ref 26.0–34.0)
MCHC: 30.2 g/dL (ref 30.0–36.0)
MCV: 77.4 fL — ABNORMAL LOW (ref 80.0–100.0)
Platelets: 457 10*3/uL — ABNORMAL HIGH (ref 150–400)
RBC: 4.83 MIL/uL (ref 3.87–5.11)
RDW: 17 % — ABNORMAL HIGH (ref 11.5–15.5)
WBC: 9.4 10*3/uL (ref 4.0–10.5)
nRBC: 0 % (ref 0.0–0.2)

## 2021-08-10 LAB — BASIC METABOLIC PANEL
Anion gap: 7 (ref 5–15)
BUN: <5 mg/dL — ABNORMAL LOW (ref 6–20)
CO2: 21 mmol/L — ABNORMAL LOW (ref 22–32)
Calcium: 8.6 mg/dL — ABNORMAL LOW (ref 8.9–10.3)
Chloride: 108 mmol/L (ref 98–111)
Creatinine, Ser: 0.47 mg/dL (ref 0.44–1.00)
GFR, Estimated: >60 mL/min (ref >60)
Glucose, Bld: 118 mg/dL — ABNORMAL HIGH (ref 70–99)
Potassium: 3.3 mmol/L — ABNORMAL LOW (ref 3.5–5.1)
Sodium: 136 mmol/L (ref 135–145)

## 2021-08-10 LAB — POCT PREGNANCY, URINE: Preg Test, Ur: NEGATIVE

## 2021-08-10 SURGERY — DILATATION & CURETTAGE/HYSTEROSCOPY WITH MYOSURE
Anesthesia: General

## 2021-08-10 MED ORDER — MIDAZOLAM HCL 5 MG/5ML IJ SOLN
INTRAMUSCULAR | Status: DC | PRN
  Administered 2021-08-10: 2 mg via INTRAVENOUS

## 2021-08-10 MED ORDER — DIPHENHYDRAMINE HCL 50 MG/ML IJ SOLN
INTRAMUSCULAR | Status: DC | PRN
  Administered 2021-08-10: 6.25 mg via INTRAVENOUS

## 2021-08-10 MED ORDER — ONDANSETRON HCL 4 MG/2ML IJ SOLN
INTRAMUSCULAR | Status: AC
  Filled 2021-08-10: qty 4

## 2021-08-10 MED ORDER — KETOROLAC TROMETHAMINE 30 MG/ML IJ SOLN
INTRAMUSCULAR | Status: AC
  Filled 2021-08-10: qty 1

## 2021-08-10 MED ORDER — DIPHENHYDRAMINE HCL 50 MG/ML IJ SOLN
INTRAMUSCULAR | Status: AC
  Filled 2021-08-10: qty 1

## 2021-08-10 MED ORDER — PROMETHAZINE HCL 25 MG/ML IJ SOLN: 6.2500 mg | INTRAMUSCULAR | Status: DC | PRN

## 2021-08-10 MED ORDER — ONDANSETRON HCL 4 MG/2ML IJ SOLN
INTRAMUSCULAR | Status: DC | PRN
Start: 2021-08-10 — End: 2021-08-10
  Administered 2021-08-10 (×2): 4 mg via INTRAVENOUS

## 2021-08-10 MED ORDER — SCOPOLAMINE 1 MG/3DAYS TD PT72
1.0000 | MEDICATED_PATCH | Freq: Once | TRANSDERMAL | Status: DC
  Administered 2021-08-10: 1.5 mg via TRANSDERMAL

## 2021-08-10 MED ORDER — MIDAZOLAM HCL 2 MG/2ML IJ SOLN
INTRAMUSCULAR | Status: AC
  Filled 2021-08-10: qty 2

## 2021-08-10 MED ORDER — ACETAMINOPHEN 500 MG PO TABS
1000.0000 mg | ORAL_TABLET | Freq: Once | ORAL | Status: AC
  Administered 2021-08-10: 1000 mg via ORAL

## 2021-08-10 MED ORDER — SODIUM CHLORIDE 0.9 % IR SOLN
Status: DC | PRN
  Administered 2021-08-10 (×2): 3000 mL

## 2021-08-10 MED ORDER — PROPOFOL 10 MG/ML IV BOLUS
INTRAVENOUS | Status: AC
  Filled 2021-08-10: qty 40

## 2021-08-10 MED ORDER — FENTANYL CITRATE (PF) 100 MCG/2ML IJ SOLN: 25.0000 ug | INTRAMUSCULAR | Status: DC | PRN

## 2021-08-10 MED ORDER — DEXAMETHASONE SODIUM PHOSPHATE 10 MG/ML IJ SOLN
INTRAMUSCULAR | Status: DC | PRN
  Administered 2021-08-10: 10 mg via INTRAVENOUS

## 2021-08-10 MED ORDER — LACTATED RINGERS IV SOLN: INTRAVENOUS | Status: DC

## 2021-08-10 MED ORDER — KETOROLAC TROMETHAMINE 30 MG/ML IJ SOLN
INTRAMUSCULAR | Status: DC | PRN
  Administered 2021-08-10: 30 mg via INTRAVENOUS

## 2021-08-10 MED ORDER — IBUPROFEN 800 MG PO TABS: 800.0000 mg | ORAL_TABLET | Freq: Three times a day (TID) | ORAL | 0 refills | Status: DC | PRN

## 2021-08-10 MED ORDER — FENTANYL CITRATE (PF) 100 MCG/2ML IJ SOLN
INTRAMUSCULAR | Status: DC | PRN
  Administered 2021-08-10: 100 ug via INTRAVENOUS

## 2021-08-10 MED ORDER — LIDOCAINE HCL 1 % IJ SOLN
INTRAMUSCULAR | Status: DC | PRN
  Administered 2021-08-10: 10 mL

## 2021-08-10 MED ORDER — ACETAMINOPHEN 500 MG PO TABS
ORAL_TABLET | ORAL | Status: AC
  Filled 2021-08-10: qty 2

## 2021-08-10 MED ORDER — LIDOCAINE 2% (20 MG/ML) 5 ML SYRINGE
INTRAMUSCULAR | Status: DC | PRN
  Administered 2021-08-10: 100 mg via INTRAVENOUS

## 2021-08-10 MED ORDER — PROPOFOL 10 MG/ML IV BOLUS
INTRAVENOUS | Status: DC | PRN
  Administered 2021-08-10: 200 mg via INTRAVENOUS

## 2021-08-10 MED ORDER — FENTANYL CITRATE (PF) 100 MCG/2ML IJ SOLN
INTRAMUSCULAR | Status: AC
  Filled 2021-08-10: qty 2

## 2021-08-10 MED ORDER — POVIDONE-IODINE 10 % EX SWAB: 2.0000 "application " | Freq: Once | CUTANEOUS | Status: DC

## 2021-08-10 MED ORDER — DEXAMETHASONE SODIUM PHOSPHATE 10 MG/ML IJ SOLN
INTRAMUSCULAR | Status: AC
  Filled 2021-08-10: qty 1

## 2021-08-10 MED ORDER — LIDOCAINE 2% (20 MG/ML) 5 ML SYRINGE
INTRAMUSCULAR | Status: AC
  Filled 2021-08-10: qty 5

## 2021-08-10 MED ORDER — OXYCODONE HCL 5 MG PO TABS
5.0000 mg | ORAL_TABLET | Freq: Once | ORAL | Status: AC | PRN
  Administered 2021-08-10: 5 mg via ORAL

## 2021-08-10 MED ORDER — SCOPOLAMINE 1 MG/3DAYS TD PT72
MEDICATED_PATCH | TRANSDERMAL | Status: AC
  Filled 2021-08-10: qty 1

## 2021-08-10 MED ORDER — OXYCODONE HCL 5 MG/5ML PO SOLN
5.0000 mg | Freq: Once | ORAL | Status: AC | PRN
Start: 2021-08-10 — End: 2021-08-10

## 2021-08-10 MED ORDER — OXYCODONE HCL 5 MG PO TABS
ORAL_TABLET | ORAL | Status: AC
  Filled 2021-08-10: qty 1

## 2021-08-10 SURGICAL SUPPLY — 16 items
CATH ROBINSON RED A/P 16FR (CATHETERS) ×2 IMPLANT
DEVICE MYOSURE LITE (MISCELLANEOUS) IMPLANT
DEVICE MYOSURE REACH (MISCELLANEOUS) ×1 IMPLANT
GAUZE 4X4 16PLY ~~LOC~~+RFID DBL (SPONGE) ×2 IMPLANT
GLOVE SURG LTX SZ6.5 (GLOVE) ×2 IMPLANT
GLOVE SURG UNDER POLY LF SZ7 (GLOVE) ×4 IMPLANT
GOWN STRL REUS W/TWL LRG LVL3 (GOWN DISPOSABLE) ×4 IMPLANT
IV NS IRRIG 3000ML ARTHROMATIC (IV SOLUTION) ×1 IMPLANT
KIT PROCEDURE FLUENT (KITS) ×2 IMPLANT
KIT TURNOVER CYSTO (KITS) ×2 IMPLANT
PACK VAGINAL MINOR WOMEN LF (CUSTOM PROCEDURE TRAY) ×2 IMPLANT
PAD OB MATERNITY 4.3X12.25 (PERSONAL CARE ITEMS) ×2 IMPLANT
SEAL CERVICAL OMNI LOK (ABLATOR) IMPLANT
SEAL ROD LENS SCOPE MYOSURE (ABLATOR) ×2 IMPLANT
TOWEL OR 17X26 10 PK STRL BLUE (TOWEL DISPOSABLE) ×4 IMPLANT
UNDERPAD 30X36 HEAVY ABSORB (UNDERPADS AND DIAPERS) ×2 IMPLANT

## 2021-08-10 NOTE — Discharge Instructions (Addendum)
DISCHARGE INSTRUCTIONS: D&C / D&E The following instructions have been prepared to help you care for yourself upon your return home.   Personal hygiene:  Use sanitary pads for vaginal drainage, not tampons.  Shower the day after your procedure.  NO tub baths, pools or Jacuzzis for 2-3 weeks.  Wipe front to back after using the bathroom.  Activity and limitations:  Do NOT drive or operate any equipment for 24 hours. The effects of anesthesia are still present and drowsiness may result.  Do NOT rest in bed all day.  Walking is encouraged.  Walk up and down stairs slowly.  You may resume your normal activity in one to two days or as indicated by your physician.  Sexual activity: NO intercourse for at least 2 weeks after the procedure, or as indicated by your physician.  Diet: Eat a light meal as desired this evening. You may resume your usual diet tomorrow.  Return to work: You may resume your work activities in one to two days or as indicated by your doctor.  What to expect after your surgery: Expect to have vaginal bleeding/discharge for 2-3 days and spotting for up to 10 days. It is not unusual to have soreness for up to 1-2 weeks. You may have a slight burning sensation when you urinate for the first day. Mild cramps may continue for a couple of days. You may have a regular period in 2-6 weeks.  Call your doctor for any of the following:  Excessive vaginal bleeding, saturating and changing one pad every hour.  Inability to urinate 6 hours after discharge from hospital.  Pain not relieved by pain medication.  Fever of 100.4 F or greater.  Unusual vaginal discharge or odor.   Post Anesthesia Home Care Instructions  Activity: Get plenty of rest for the remainder of the day. A responsible individual must stay with you for 24 hours following the procedure.  For the next 24 hours, DO NOT: -Drive a car -Advertising copywriter -Drink alcoholic beverages -Take any medication unless  instructed by your physician -Make any legal decisions or sign important papers.  Meals: Start with liquid foods such as gelatin or soup. Progress to regular foods as tolerated. Avoid greasy, spicy, heavy foods. If nausea and/or vomiting occur, drink only clear liquids until the nausea and/or vomiting subsides. Call your physician if vomiting continues.  Special Instructions/Symptoms: Your throat may feel dry or sore from the anesthesia or the breathing tube placed in your throat during surgery. If this causes discomfort, gargle with warm salt water. The discomfort should disappear within 24 hours.  If you had a scopolamine patch placed behind your ear for the management of post- operative nausea and/or vomiting:  1. The medication in the patch is effective for 72 hours, after which it should be removed.  Wrap patch in a tissue and discard in the trash. Wash hands thoroughly with soap and water. 2. You may remove the patch earlier than 72 hours if you experience unpleasant side effects which may include dry mouth, dizziness or visual disturbances. 3. Avoid touching the patch. Wash your hands with soap and water after contact with the patch.  No acetaminophen/Tylenol until after 12:15 pm today if needed. No ibuprofen, Advil, Aleve, Motrin, ketorolac, meloxicam, or naproxen until after 2:00 pm today if needed.

## 2021-08-10 NOTE — H&P (Signed)
Jaime Ramos is an 25 y.o. female presenting for scheduled surgery, hysteroscopy, dilation and curettage for AUB and endometrial polyps.  25Y [id number] LMP 06/18/21 presenting for surgery  Patient had been bleeding heavily for several weeks until started on Aygestin taper to stop the bleeding which finally stopped 9/12, resumed last week and then stopped 2 days ago after increasing Aygestin to 2 pills per day. No bleeding today. Was seen in UC last week for bleeding, states she was treated for BV and told to follow up with scheduled surgery. Completed Flagyl course for BV. Work up in the office as follows:  TVUS 07/20/21: retroverted uterus 7.7x6.0x4.9cm ES irregular thickening 17.5 mm with internal vascularity cannot r/o intracavitary lesion, nml b/l polycystic appearing ov  SIS 07/27/21: thickened polyps along the endometrial walls both anterior and posterior ~2.7cm, 2.6cm, and 1.6cm in greatest diameter, underlying endometrial walls appear WNL  History of 1 previous D&C for MAB, no other surgeries History of "5 month" pregnancy loss and MTX for ectopic  Not currently TTC but is interested in future fertility. Would be agreeable to contraception No major medical problems- asthma, mild, albuterol PRN Up to date AEX 04/19/21     Menstrual History: Patient's last menstrual period was 06/19/2021 (approximate).    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 08/02/2021   Depression    doing good   Genital herpes    Obesity    Pre-diabetes    Precocious puberty    Trichomonal vaginitis    Wears glasses     Past Surgical History:  Procedure Laterality Date   DILATION AND CURETTAGE OF UTERUS N/A 11/09/2014   Procedure: DILATATION AND CURETTAGE;  Surgeon: Allie Bossier, MD;  Location: [facility];  Service: Gynecology;  Laterality: N/A;     Family History  Problem Relation Age of Onset   Diabetes Mother    Hypertension Mother    Obesity Mother    Fibroids Mother    Cancer Mother        thyroid   Obesity Father    Obesity Maternal Aunt    Fibroids Maternal Aunt    Obesity Maternal Uncle    Obesity Paternal Aunt    Diabetes Maternal Grandmother    Obesity Maternal Grandmother    Hypertension Maternal Grandmother    Diabetes Maternal Grandfather    Obesity Maternal Grandfather    Stroke Maternal Grandfather    Heart disease Maternal Grandfather    Hypertension Maternal Grandfather    Obesity Paternal Grandfather    Heart disease Paternal Grandfather    Bipolar disorder Brother    Bipolar disorder Brother     Social History:  reports that she has never smoked. She has never used smokeless tobacco. She reports that she does not drink alcohol and does not use drugs.  Allergies: No Known Allergies  Medications Prior to Admission  Medication Sig Dispense Refill Last Dose   albuterol (PROVENTIL) (2.5 MG/3ML) 0.083% nebulizer solution Take 2.5 mg by nebulization as needed for wheezing or shortness of breath. As needed for her asthma. 08/02/2021   Past Week   metroNIDAZOLE (FLAGYL) 500 MG tablet Take 1 tablet (500 mg total) by mouth 2 (two) times daily. 14 tablet 0 08/09/2021   norethindrone-ethinyl estradiol (CYCLAFEM) 0.5/0.75/1-35  MG-MCG tablet Take 1 tablet by mouth daily.   08/09/2021   ondansetron (ZOFRAN) 4 MG tablet Take 4 mg by mouth as needed for nausea or vomiting. For nausea   Past Week   azithromycin (ZITHROMAX) 250 MG tablet Take 1 tablet (250 mg total) by mouth daily. Take first 2 tablets together, then 1 every day until finished. 6 tablet 0    benzonatate (TESSALON) 100 MG capsule Take 1 capsule (100 mg total) by mouth every 8 (eight) hours. 21 capsule 0    buPROPion (ZYBAN) 150 MG 12 hr tablet Take 150 mg by mouth 2 (two) times daily.   Unknown   HYDROcodone-acetaminophen (NORCO/VICODIN) 5-325 MG tablet  Take 2 tablets by mouth every 4 (four) hours as needed. 6 tablet 0 Unknown   Hypromellose 0.3 % SOLN Use as needed for irritation 30 mL 0     Review of Systems  All other systems reviewed and are negative.  Blood pressure (!) 147/88, pulse 95, temperature 99.6 F (37.6 C), temperature source Oral, resp. rate 18, height 5\' 6"  (1.676 m), weight 136 kg, last menstrual period 06/19/2021, SpO2 100 %, unknown if currently breastfeeding. Physical Exam Vitals reviewed.  Constitutional:      Appearance: Normal appearance. She is obese.  HENT:     Head: Normocephalic.  Cardiovascular:     Rate and Rhythm: Normal rate.  Pulmonary:     Effort: Pulmonary effort is normal.  Abdominal:     Palpations: Abdomen is soft.     Tenderness: There is no abdominal tenderness.  Genitourinary:    General: Normal vulva.  Musculoskeletal:        General: Normal range of motion.     Cervical back: Normal range of motion.  Skin:    General: Skin is warm and dry.  Neurological:     General: No focal deficit present.     Mental Status: She is alert and oriented to person, place, and time.  Psychiatric:        Mood and Affect: Mood normal.        Behavior: Behavior normal.    Results for orders placed or performed during the hospital encounter of 08/10/21 (from the past 24 hour(s))  Pregnancy, urine POC     Status: None   Collection Time: 08/10/21  5:51 AM  Result Value Ref Range   Preg Test, Ur NEGATIVE NEGATIVE  Type and screen     Status: None (Preliminary result)   Collection Time: 08/10/21  6:30 AM  Result Value Ref Range   ABO/RH(D) PENDING    Antibody Screen PENDING    Sample Expiration      08/13/2021,2359 Performed at [facility], [address], [place], [place]   CBC     Status: Abnormal   Collection Time: 08/10/21  6:30 AM  Result Value Ref Range   WBC 9.4 4.0 - 10.5 K/uL   RBC 4.83 3.87 - 5.11 MIL/uL   Hemoglobin 11.3 (L) 12.0 - 15.0 g/dL   HCT 10/10/21  01.7 - 51.0 %   MCV 77.4 (L) 80.0 - 100.0 fL   MCH 23.4 (L) 26.0 - 34.0 pg   MCHC 30.2 30.0 - 36.0 g/dL   RDW 25.8 (H) 52.7 - 78.2 %   Platelets 457 (H) 150 - 400 K/uL   nRBC 0.0 0.0 - 0.2 %  Basic metabolic panel     Status: Abnormal   Collection Time: 08/10/21  6:30 AM  Result Value Ref Range  Sodium 136 135 - 145 mmol/L   Potassium 3.3 (L) 3.5 - 5.1 mmol/L   Chloride 108 98 - 111 mmol/L   CO2 21 (L) 22 - 32 mmol/L   Glucose, Bld 118 (H) 70 - 99 mg/dL   BUN <5 (L) 6 - 20 mg/dL   Creatinine, Ser 2.48 0.44 - 1.00 mg/dL   Calcium 8.6 (L) 8.9 - 10.3 mg/dL   GFR, Estimated >25 >00 mL/min   Anion gap 7 5 - 15     Assessment/Plan: 25Y [id number] LMP 06/18/21 with AUB and endometrial polyps scheduled for hysteroscopy, polypectomy, and dilation and curettage  Patient has been counseled both in the office and again in preop holding regarding risks of surgery. She is aware of risks including but not limited to bleeding, infection, damage to surrounding organs, uterine perforation, and uterine adhesion formation impacting future fertility. Consents have been signed and placed in patient chart.  -NPO -No antibiotics indicated -SCD VTE ppx -Routine preop/postop care -Anticipate DC home today. DC instructions reviewed. Patient is scheduled for 2 week postop visit in the office already on 08/25/21  Llana Deshazo A Zayli Villafuerte 08/10/2021, 7:26 AM

## 2021-08-10 NOTE — Anesthesia Postprocedure Evaluation (Signed)
Anesthesia Post Note  Patient: Jaime Ramos  Procedure(s) Performed: DILATATION & CURETTAGE/HYSTEROSCOPY WITH MYOSURE     Patient location during evaluation: PACU Anesthesia Type: General Level of consciousness: awake and alert and oriented Pain management: pain level controlled Vital Signs Assessment: post-procedure vital signs reviewed and stable Respiratory status: spontaneous breathing, nonlabored ventilation and respiratory function stable Cardiovascular status: blood pressure returned to baseline Postop Assessment: no apparent nausea or vomiting Anesthetic complications: no   No notable events documented.  Last Vitals:  Vitals:   08/10/21 0830 08/10/21 0845  BP: (!) 161/88 (!) 160/76  Pulse: (!) 109 95  Resp: (!) 35 (!) 27  Temp:    SpO2: 93% 95%    Last Pain:  Vitals:   08/10/21 0845  TempSrc:   PainSc: 0-No pain                 Shanda Howells

## 2021-08-10 NOTE — Transfer of Care (Signed)
Immediate Anesthesia Transfer of Care Note  Patient: Jaime Ramos  Procedure(s) Performed: DILATATION & CURETTAGE/HYSTEROSCOPY WITH MYOSURE  Patient Location: PACU  Anesthesia Type:General  Level of Consciousness: drowsy and patient cooperative  Airway & Oxygen Therapy: Patient Spontanous Breathing and Patient connected to face mask oxygen  Post-op Assessment: Report given to RN and Post -op Vital signs reviewed and stable  Post vital signs: Reviewed and stable  Last Vitals:  Vitals Value Taken Time  BP 155/117 08/10/21 0824  Temp    Pulse 103 08/10/21 0826  Resp 29 08/10/21 0826  SpO2 100 % 08/10/21 0826  Vitals shown include unvalidated device data.  Last Pain:  Vitals:   08/10/21 0613  TempSrc: Oral         Complications: No notable events documented.

## 2021-08-10 NOTE — Op Note (Signed)
Jaime Ramos 04-01-1996 [id number]  Operative Note  PROCEDURE: operative hysteroscopy, polypectomy, dilation and curettage   PRE-OPERATIVE DIAGNOSIS: AUB, endometrial polyps  POST-OPERATIVE DIAGNOSIS: AUB, endometrial polyps  SURGEON: Dr. Clance Boll, DO  ASSISTANT: N/A  FINDINGS: normal appearing external female genitalia, normal appearing ectocervix, uterine cavity visualization limited by polypoid tissue throughout, resected under direct visualization and normal smooth contour uterine cavity appreciated with visualization to bilateral tubal ostia  SPECIMENS: endometrial polyps, endometrial curetting sent together  EBL: minimal  FLUIDS: Deficit 230 mL  COMPLICATIONS: None  PROCEDURE IN DETAIL:   After the patient was appropriately consented in the holding area, she was taken to the operating room where general anesthesia was administered without complications. The patient was placed in the dorsal lithotomy position. The patient was prepped and draped in the usual sterile fashion. The bladder was emptied just prior to entering OR by spontaneous void. An appropriate time out was performed that verified the correct patient, procedure, and surgical team.   A sterile speculum was inserted into the vagina and the cervix was visualized. A single tooth tenaculum was used to grasp the anterior lip of the cervix. A paracervical block was obtained using 1% plain lidocaine injecting a total of 10 cc divided between the 4 and 8 o'clock positions. The cervix was sequentially dilated up to a size 21 french. The hysteroscopy was introduced through the cervix and advanced to the uterine fundus. The uterine cavity distension was obtained without difficulty and the findings were noted as described above. The MyoSure 'Reach' was utilized to resect the polyps under direct visualization. The hysteroscopy was removed. A gentle sharp curetting was performed and specimens collected on a Telfa. All  specimens were sent for final pathology. The tenaculum was removed and excellent hemostasis was appreciated. Speculum was removed. Patient tolerated the procedure well and was taken to the recovery room in stable condition. All instrument and lap counts were correct.   [name] 08/10/21 8:20 AM

## 2021-08-10 NOTE — Anesthesia Procedure Notes (Signed)
Procedure Name: LMA Insertion Date/Time: 08/10/2021 7:39 AM Performed by: Marny Lowenstein, CRNA Pre-anesthesia Checklist: Patient identified, Emergency Drugs available, Suction available and Patient being monitored Patient Re-evaluated:Patient Re-evaluated prior to induction Oxygen Delivery Method: Circle system utilized Preoxygenation: Pre-oxygenation with 100% oxygen Induction Type: IV induction Ventilation: Mask ventilation without difficulty and Oral airway inserted - appropriate to patient size LMA: LMA inserted LMA Size: 4.0 Number of attempts: 1 Airway Equipment and Method: Patient positioned with wedge pillow Placement Confirmation: positive ETCO2 and breath sounds checked- equal and bilateral Tube secured with: Tape Dental Injury: Teeth and Oropharynx as per pre-operative assessment

## 2021-08-11 LAB — SURGICAL PATHOLOGY

## 2021-08-14 ENCOUNTER — Encounter (HOSPITAL_BASED_OUTPATIENT_CLINIC_OR_DEPARTMENT_OTHER): Payer: Self-pay | Admitting: Obstetrics and Gynecology

## 2021-11-07 ENCOUNTER — Ambulatory Visit
Admission: EM | Admit: 2021-11-07 | Discharge: 2021-11-07 | Disposition: A | Discharge status: Home / Self Care | Payer: BC Managed Care – PPO | Attending: Urgent Care | Admitting: Urgent Care

## 2021-11-07 ENCOUNTER — Other Ambulatory Visit: Payer: Self-pay

## 2021-11-07 DIAGNOSIS — R300 Dysuria: Secondary | ICD-10-CM | POA: Diagnosis present

## 2021-11-07 DIAGNOSIS — Z202 Contact with and (suspected) exposure to infections with a predominantly sexual mode of transmission: Secondary | ICD-10-CM | POA: Insufficient documentation

## 2021-11-07 LAB — POCT URINALYSIS DIP (MANUAL ENTRY)
Bilirubin, UA: NEGATIVE
Blood, UA: NEGATIVE
Glucose, UA: NEGATIVE mg/dL
Ketones, POC UA: NEGATIVE mg/dL
Leukocytes, UA: NEGATIVE
Nitrite, UA: NEGATIVE
Protein Ur, POC: NEGATIVE mg/dL
Spec Grav, UA: 1.025 (ref 1.010–1.025)
Urobilinogen, UA: 0.2 E.U./dL
pH, UA: 7 (ref 5.0–8.0)

## 2021-11-07 LAB — POCT URINE PREGNANCY: Preg Test, Ur: NEGATIVE

## 2021-11-07 MED ORDER — METRONIDAZOLE 500 MG PO TABS: 500.0000 mg | ORAL_TABLET | Freq: Two times a day (BID) | ORAL | 0 refills | Status: DC

## 2021-11-07 MED ORDER — DOXYCYCLINE HYCLATE 100 MG PO CAPS: 100.0000 mg | ORAL_CAPSULE | Freq: Two times a day (BID) | ORAL | 0 refills | Status: DC

## 2021-11-07 NOTE — Discharge Instructions (Signed)
Avoid all forms of sexual intercourse (oral, vaginal, anal) for the next 7 days to avoid spreading/reinfecting or at least until we can see what kinds of infection results are positive. Return if symptoms worsen/do not resolve, you develop fever, abdominal pain, blood in your urine, or are re-exposed to a sexually transmitted infection (STI).  

## 2021-11-07 NOTE — ED Provider Notes (Signed)
[facility]   MRN: [id number] DOB: [birth year]  Subjective:   Jaime Ramos is a 26 y.o. female presenting for 2-day history of acute onset dysuria. Has had BV, last episode was 07/2021. Has a history of chlamydia, trichomoniasis. Had unprotected sex with 1 female partner, he tested positive for chlamydia, trichomoniasis.  No fever, nausea, vomiting, vaginal discharge.  No pelvic or flank pains.  No current facility-administered medications for this encounter.  Current Outpatient Medications:    albuterol (PROVENTIL) (2.5 MG/3ML) 0.083% nebulizer solution, Take 2.5 mg by nebulization as needed for wheezing or shortness of breath. As needed for her asthma. 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 08/02/2021   Depression    doing good   Genital herpes    Obesity    Pre-diabetes    Precocious puberty    Trichomonal vaginitis    Wears glasses      Past Surgical History:  Procedure Laterality Date   DILATATION & CURETTAGE/HYSTEROSCOPY WITH MYOSURE N/A 08/10/2021   Procedure: DILATATION & CURETTAGE/HYSTEROSCOPY WITH MYOSURE;  Surgeon: Toy Baker, DO;  Location: [facility];  Service: Gynecology;  Laterality: N/A;   DILATION AND CURETTAGE OF UTERUS N/A 11/09/2014   Procedure: DILATATION AND CURETTAGE;  Surgeon: Allie Bossier, MD;  Location: [facility];  Service: Gynecology;  Laterality: N/A;    Family History  Problem Relation Age of Onset   Diabetes Mother    Hypertension Mother    Obesity Mother    Fibroids Mother     Cancer Mother        thyroid   Obesity Father    Obesity Maternal Aunt    Fibroids Maternal Aunt    Obesity Maternal Uncle    Obesity Paternal Aunt    Diabetes Maternal Grandmother    Obesity Maternal Grandmother    Hypertension Maternal Grandmother    Diabetes Maternal Grandfather    Obesity Maternal Grandfather    Stroke Maternal Grandfather    Heart disease Maternal Grandfather    Hypertension Maternal Grandfather    Obesity Paternal Grandfather    Heart disease Paternal Grandfather    Bipolar disorder Brother    Bipolar disorder Brother     Social History   Tobacco Use   Smoking status: Never   Smokeless tobacco: Never  Vaping Use   Vaping Use: Never used  Substance Use Topics   Alcohol use: No   Drug use: Never    ROS   Objective:   Vitals: BP 113/79 (BP Location: Right Arm)    Pulse 74    Temp 98.3 F (36.8 C) (Oral)    Resp 18    LMP  (Within Weeks) Comment: 2 weeks   SpO2 98%    Breastfeeding No   Physical Exam Constitutional:      General: She is not in acute distress.    Appearance: Normal appearance. She is well-developed. She is  obese. She is not ill-appearing, toxic-appearing or diaphoretic.  HENT:     Head: Normocephalic and atraumatic.     Nose: Nose normal.     Mouth/Throat:     Mouth: Mucous membranes are moist.     Pharynx: Oropharynx is clear.  Eyes:     General: No scleral icterus.       Right eye: No discharge.        Left eye: No discharge.     Extraocular Movements: Extraocular movements intact.     Conjunctiva/sclera: Conjunctivae normal.  Cardiovascular:     Rate and Rhythm: Normal rate.  Pulmonary:     Effort: Pulmonary effort is normal.  Abdominal:     General: Bowel sounds are normal. There is no distension.     Palpations: Abdomen is soft. There is no mass.     Tenderness: There is no abdominal tenderness. There is no right CVA tenderness, left CVA tenderness, guarding or rebound.  Skin:    General: Skin is warm and  dry.  Neurological:     General: No focal deficit present.     Mental Status: She is alert and oriented to person, place, and time.  Psychiatric:        Mood and Affect: Mood normal.        Behavior: Behavior normal.        Thought Content: Thought content normal.        Judgment: Judgment normal.    Results for orders placed or performed during the hospital encounter of 11/07/21 (from the past 24 hour(s))  POCT urinalysis dipstick     Status: Abnormal   Collection Time: 11/07/21  1:53 PM  Result Value Ref Range   Color, UA yellow yellow   Clarity, UA cloudy (A) clear   Glucose, UA negative negative mg/dL   Bilirubin, UA negative negative   Ketones, POC UA negative negative mg/dL   Spec Grav, UA 1.610 9.604 - 1.025   Blood, UA negative negative   pH, UA 7.0 5.0 - 8.0   Protein Ur, POC negative negative mg/dL   Urobilinogen, UA 0.2 0.2 or 1.0 E.U./dL   Nitrite, UA Negative Negative   Leukocytes, UA Negative Negative  POCT urine pregnancy     Status: None   Collection Time: 11/07/21  1:54 PM  Result Value Ref Range   Preg Test, Ur Negative Negative    Assessment and Plan :   PDMP not reviewed this encounter.  1. Dysuria   2. STD exposure   3. Exposure to chlamydia   4. Exposure to trichomonas    We will treat empirically given her exposure to trichomonas and chlamydia.  Start doxycycline and Flagyl.  Encouraged safe sex practices.  Labs pending, will treat as appropriate.  Abstain for at least a week to ensure she clears the infections. Counseled patient on potential for adverse effects with medications prescribed/recommended today, ER and return-to-clinic precautions discussed, patient verbalized understanding.    Wallis Bamberg, PA-C 11/07/21 1359

## 2021-11-07 NOTE — ED Triage Notes (Signed)
Pt reports burning when urinating x 2 days.   Pt reports she was exposed to chlamydia and trichomonas.

## 2021-11-08 ENCOUNTER — Ambulatory Visit: Payer: Self-pay

## 2021-11-08 LAB — CERVICOVAGINAL ANCILLARY ONLY
Bacterial Vaginitis (gardnerella): POSITIVE — AB
Candida Glabrata: NEGATIVE
Candida Vaginitis: NEGATIVE
Chlamydia: NEGATIVE
Comment: NEGATIVE
Comment: NEGATIVE
Comment: NEGATIVE
Comment: NEGATIVE
Comment: NEGATIVE
Comment: NORMAL
Neisseria Gonorrhea: NEGATIVE
Trichomonas: NEGATIVE

## 2022-01-05 ENCOUNTER — Ambulatory Visit
Admission: RE | Admit: 2022-01-05 | Discharge: 2022-01-05 | Disposition: A | Discharge status: Home / Self Care | Payer: BC Managed Care – PPO | Source: Ambulatory Visit | Attending: Emergency Medicine | Admitting: Emergency Medicine

## 2022-01-05 ENCOUNTER — Other Ambulatory Visit: Payer: Self-pay

## 2022-01-05 VITALS — BP 105/71 | HR 85 | Temp 98.5°F | Resp 18

## 2022-01-05 DIAGNOSIS — T7840XA Allergy, unspecified, initial encounter: Secondary | ICD-10-CM | POA: Diagnosis not present

## 2022-01-05 MED ORDER — METHYLPREDNISOLONE SODIUM SUCC 125 MG IJ SOLR
125.0000 mg | Freq: Once | INTRAMUSCULAR | Status: AC
  Administered 2022-01-05: 125 mg via INTRAMUSCULAR

## 2022-01-05 MED ORDER — METHYLPREDNISOLONE 4 MG PO TABS: ORAL_TABLET | ORAL | 0 refills | Status: AC

## 2022-01-05 NOTE — ED Provider Notes (Signed)
[facility]    CSN: [id number] Arrival date & time: 01/05/22  1321    HISTORY   Chief Complaint  Patient presents with   appt 140p   Facial Swelling   Foot Swelling   HPI Jaime Ramos is a 26 y.o. female. Pt c/o top lip, eyes and feet have been swollen and itching for 3 days. Took Benadryl 50 mg last night which she states did not help, woke up with slightly worse symptoms this morning.  Patient states she is had allergic reactions in the past, states has not been allergy tested.  The last time she was around a short haired animal, she had a severe allergic reaction which actually caused her throat to swell making it difficult to breathe.  At no point, has patient sought emergency medical attention for these allergic reactions.  Patient states she is also never been allergy tested.  The history is provided by the patient.  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 08/02/2021   Depression    doing good   Genital herpes    Obesity    Pre-diabetes    Precocious puberty    Trichomonal vaginitis    Wears glasses    Patient Active Problem List   Diagnosis Date Noted   Trichomoniasis 09/23/2018   Abnormal Pap smear of cervix 12/20/2016   MDD (major depressive disorder), recurrent episode, moderate (HCC) 01/25/2016   Chlamydia 04/27/2015   Nexplanon removal 02/09/2015   Preterm delivery 10/10/2013   Preterm labor 10/09/2013   Depression 10/02/2013   Vaginal bleeding before [redacted] weeks gestation 10/02/2013   Acanthosis 05/28/2012   Menorrhagia with regular cycle 07/31/2011   Goiter 07/31/2011   Prediabetes    Obesity 05/17/2011   Past Surgical History:  Procedure Laterality Date   DILATATION & CURETTAGE/HYSTEROSCOPY WITH MYOSURE N/A 08/10/2021   Procedure: DILATATION & CURETTAGE/HYSTEROSCOPY WITH  MYOSURE;  Surgeon: Toy Baker, DO;  Location: [facility];  Service: Gynecology;  Laterality: N/A;   DILATION AND CURETTAGE OF UTERUS N/A 11/09/2014   Procedure: DILATATION AND CURETTAGE;  Surgeon: Allie Bossier, MD;  Location: [facility];  Service: Gynecology;  Laterality: N/A;   OB History     Gravida  3   Para  1   Term      Preterm  1   AB  2   Living  0      SAB  1   IAB      Ectopic  1   Multiple      Live Births  1          Home Medications    Prior to Admission medications   Medication Sig Start Date End Date Taking? Authorizing Provider  fluticasone (FLONASE) 50 MCG/ACT nasal spray Place 1 spray into both nostrils daily for 14 days. 03/07/20 03/24/20  AvegnoZachery Dakins, FNP   Family History Family History  Problem Relation Age of Onset   Diabetes Mother    Hypertension Mother    Obesity Mother    Fibroids Mother    Cancer Mother        thyroid   Obesity Father    Obesity Maternal Aunt    Fibroids Maternal Aunt    Obesity Maternal  Uncle    Obesity Paternal Aunt    Diabetes Maternal Grandmother    Obesity Maternal Grandmother    Hypertension Maternal Grandmother    Diabetes Maternal Grandfather    Obesity Maternal Grandfather    Stroke Maternal Grandfather    Heart disease Maternal Grandfather    Hypertension Maternal Grandfather    Obesity Paternal Grandfather    Heart disease Paternal Grandfather    Bipolar disorder Brother    Bipolar disorder Brother    Social History Social History   Tobacco Use   Smoking status: Never   Smokeless tobacco: Never  Vaping Use   Vaping Use: Never used  Substance Use Topics   Alcohol use: No   Drug use: Never   Allergies   Patient has no known allergies.  Review of Systems Review of Systems Pertinent findings noted in history of present illness.   Physical Exam Triage Vital Signs ED Triage Vitals  Enc Vitals Group     BP 09/01/21 0827 (!) 147/82     Pulse Rate 09/01/21 0827  72     Resp 09/01/21 0827 18     Temp 09/01/21 0827 98.3 F (36.8 C)     Temp Source 09/01/21 0827 Oral     SpO2 09/01/21 0827 98 %     Weight --      Height --      Head Circumference --      Peak Flow --      Pain Score 09/01/21 0826 5     Pain Loc --      Pain Edu? --      Excl. in [place]? --   No data found.  Updated Vital Signs BP 105/71 (BP Location: Right Arm)   Pulse 85   Temp 98.5 F (36.9 C) (Oral)   Resp 18   SpO2 98%   Physical Exam Vitals and nursing note reviewed.  Constitutional:      General: She is not in acute distress.    Appearance: Normal appearance. She is not ill-appearing.  HENT:     Head: Normocephalic and atraumatic.     Salivary Glands: Right salivary gland is not diffusely enlarged or tender. Left salivary gland is not diffusely enlarged or tender.     Right Ear: Ear canal and external ear normal. No drainage. A middle ear effusion is present. There is no impacted cerumen. Tympanic membrane is bulging. Tympanic membrane is not injected or erythematous.     Left Ear: Ear canal and external ear normal. No drainage. A middle ear effusion is present. There is no impacted cerumen. Tympanic membrane is bulging. Tympanic membrane is not injected or erythematous.     Ears:     Comments: Bilateral EACs normal, both TMs bulging with clear fluid    Nose: Rhinorrhea present. No nasal deformity, septal deviation, signs of injury, nasal tenderness, mucosal edema or congestion. Rhinorrhea is clear.     Right Nostril: Occlusion present. No foreign body, epistaxis or septal hematoma.     Left Nostril: Occlusion present. No foreign body, epistaxis or septal hematoma.     Right Turbinates: Enlarged, swollen and pale.     Left Turbinates: Enlarged, swollen and pale.     Right Sinus: No maxillary sinus tenderness or frontal sinus tenderness.     Left Sinus: No maxillary sinus tenderness or frontal sinus tenderness.     Mouth/Throat:     Lips: Pink. No lesions.      Mouth: Mucous membranes are moist.  No oral lesions.     Pharynx: Oropharynx is clear. Uvula midline. No posterior oropharyngeal erythema or uvula swelling.     Tonsils: No tonsillar exudate. 0 on the right. 0 on the left.     Comments: Medial aspect of upper lip is swollen Eyes:     General:        Right eye: No discharge.        Left eye: No discharge.     Extraocular Movements: Extraocular movements intact.     Conjunctiva/sclera: Conjunctivae normal.     Right eye: Right conjunctiva is not injected.     Left eye: Left conjunctiva is not injected.     Comments: Both upper and lower eyelids are puffy  Neck:     Trachea: Trachea and phonation normal.  Cardiovascular:     Rate and Rhythm: Normal rate and regular rhythm.     Pulses: Normal pulses.     Heart sounds: Normal heart sounds. No murmur heard.   No friction rub. No gallop.  Pulmonary:     Effort: Pulmonary effort is normal. No accessory muscle usage, prolonged expiration or respiratory distress.     Breath sounds: Normal breath sounds. No stridor, decreased air movement or transmitted upper airway sounds. No decreased breath sounds, wheezing, rhonchi or rales.  Chest:     Chest wall: No tenderness.  Musculoskeletal:        General: Normal range of motion.     Cervical back: Normal range of motion and neck supple. Normal range of motion.  Lymphadenopathy:     Cervical: No cervical adenopathy.  Skin:    General: Skin is warm and dry.     Findings: No erythema or rash.  Neurological:     General: No focal deficit present.     Mental Status: She is alert and oriented to person, place, and time.  Psychiatric:        Mood and Affect: Mood normal.        Behavior: Behavior normal.    Visual Acuity Right Eye Distance:   Left Eye Distance:   Bilateral Distance:    Right Eye Near:   Left Eye Near:    Bilateral Near:     UC Couse / Diagnostics / Procedures:    EKG  Radiology No results  found.  Procedures Procedures (including critical care time)  UC Diagnoses / Final Clinical Impressions(s)   I have reviewed the triage vital signs and the nursing notes.  Pertinent labs & imaging results that were available during my care of the patient were reviewed by me and considered in my medical decision making (see chart for details).   Final diagnoses:  Allergic reaction, initial encounter   Patient strongly encouraged to follow-up with an allergist to be tested, advised she may need a EpiPen as well.  Patient provided with steroid injection and steroids at this visit today.  Patient encouraged to present to the emergency room the next time she notices she is having allergic reaction as it may be life-threatening.  Patient verbalized understanding.  Return precautions advised.  ED Prescriptions     Medication Sig Dispense Auth. Provider   methylPREDNISolone (MEDROL) 4 MG tablet Take 8 tablets (32 mg total) by mouth daily for 1 day, THEN 7 tablets (28 mg total) daily for 1 day, THEN 6 tablets (24 mg total) daily for 1 day, THEN 5 tablets (20 mg total) daily for 1 day, THEN 4 tablets (16 mg total) daily for 1  day, THEN 3 tablets (12 mg total) daily for 1 day, THEN 2 tablets (8 mg total) daily for 1 day, THEN 1 tablet (4 mg total) daily for 1 day. 36 tablet Theadora Rama Scales, PA-C      PDMP not reviewed this encounter.  Pending results:  Labs Reviewed - No data to display  Medications Ordered in UC: Medications  methylPREDNISolone sodium succinate (SOLU-MEDROL) 125 mg/2 mL injection 125 mg (125 mg Intramuscular Given 01/05/22 1500)    Disposition Upon Discharge:  Condition: stable for discharge home Home: take medications as prescribed; routine discharge instructions as discussed; follow up as advised.  Patient presented with an acute illness with associated systemic symptoms and significant discomfort requiring urgent management. In my opinion, this is a condition that a  prudent lay person (someone who possesses an average knowledge of health and medicine) may potentially expect to result in complications if not addressed urgently such as respiratory distress, impairment of bodily function or dysfunction of bodily organs.   Routine symptom specific, illness specific and/or disease specific instructions were discussed with the patient and/or caregiver at length.   As such, the patient has been evaluated and assessed, work-up was performed and treatment was provided in alignment with urgent care protocols and evidence based medicine.  Patient/parent/caregiver has been advised that the patient may require follow up for further testing and treatment if the symptoms continue in spite of treatment, as clinically indicated and appropriate.  If the patient was tested for COVID-19, Influenza and/or RSV, then the patient/parent/guardian was advised to isolate at home pending the results of his/her diagnostic coronavirus test and potentially longer if theyre positive. I have also advised pt that if his/her COVID-19 test returns positive, it's recommended to self-isolate for at least 10 days after symptoms first appeared AND until fever-free for 24 hours without fever reducer AND other symptoms have improved or resolved. Discussed self-isolation recommendations as well as instructions for household member/close contacts as per the [facility] and [place] DHHS, and also gave patient the COVID packet with this information.  Patient/parent/caregiver has been advised to return to the [facility] or PCP in 3-5 days if no better; to PCP or the Emergency Department if new signs and symptoms develop, or if the current signs or symptoms continue to change or worsen for further workup, evaluation and treatment as clinically indicated and appropriate  The patient will follow up with their current PCP if and as advised. If the patient does not currently have a PCP we will assist them in obtaining one.   The  patient may need specialty follow up if the symptoms continue, in spite of conservative treatment and management, for further workup, evaluation, consultation and treatment as clinically indicated and appropriate.  Patient/parent/caregiver verbalized understanding and agreement of plan as discussed.  All questions were addressed during visit.  Please see discharge instructions below for further details of plan.  Discharge Instructions:   Discharge Instructions      You received an injection of methylprednisolone in the office today.  When you get home, please continue taking Benadryl 50 mg every 6 hours until the swelling in your upper lip is gone.  Please begin taking methylprednisolone tablets tomorrow morning with your breakfast meal as prescribed.  Please take all tablets of each dose at once, do not attempt to space them out through your day.  I strongly recommend that you go to the emergency room if you have not had significant resolution of your symptoms by 10 PM this  evening.  If you have had meaningful resolution of your symptoms by this evening and continue to improve over the next several days while taking the tapering dose of methylprednisolone, recommend that you strongly consider reaching out to [facility] [place] to schedule appointment for allergy testing.  You may require an EpiPen at home as well, they can prescribe this for you.  Thank you for visiting urgent care today.    This office note has been dictated using Teaching laboratory technician.  Unfortunately, and despite my best efforts, this method of dictation can sometimes lead to occasional typographical or grammatical errors.  I apologize in advance if this occurs.     Theadora Rama Scales, PA-C 01/06/22 1505

## 2022-01-05 NOTE — ED Triage Notes (Signed)
Pt c/o top lip, eyes and feet have been swollen and itching for 3 days. Took Benadryl last night and not helping.  ?

## 2022-01-05 NOTE — Discharge Instructions (Addendum)
You received an injection of methylprednisolone in the office today.  When you get home, please continue taking Benadryl 50 mg every 6 hours until the swelling in your upper lip is gone. ? ?Please begin taking methylprednisolone tablets tomorrow morning with your breakfast meal as prescribed.  Please take all tablets of each dose at once, do not attempt to space them out through your day. ? ?I strongly recommend that you go to the emergency room if you have not had significant resolution of your symptoms by 10 PM this evening. ? ?If you have had meaningful resolution of your symptoms by this evening and continue to improve over the next several days while taking the tapering dose of methylprednisolone, recommend that you strongly consider reaching out to [facility] [place] to schedule appointment for allergy testing.  You may require an EpiPen at home as well, they can prescribe this for you. ? ?Thank you for visiting urgent care today. ?

## 2022-01-22 ENCOUNTER — Ambulatory Visit
Admission: EM | Admit: 2022-01-22 | Discharge: 2022-01-22 | Disposition: A | Discharge status: Home / Self Care | Payer: BC Managed Care – PPO | Attending: Emergency Medicine | Admitting: Emergency Medicine

## 2022-01-22 ENCOUNTER — Other Ambulatory Visit: Payer: Self-pay

## 2022-01-22 DIAGNOSIS — R630 Anorexia: Secondary | ICD-10-CM | POA: Insufficient documentation

## 2022-01-22 DIAGNOSIS — R110 Nausea: Secondary | ICD-10-CM | POA: Insufficient documentation

## 2022-01-22 DIAGNOSIS — R000 Tachycardia, unspecified: Secondary | ICD-10-CM | POA: Insufficient documentation

## 2022-01-22 DIAGNOSIS — B349 Viral infection, unspecified: Secondary | ICD-10-CM | POA: Insufficient documentation

## 2022-01-22 DIAGNOSIS — R52 Pain, unspecified: Secondary | ICD-10-CM | POA: Insufficient documentation

## 2022-01-22 DIAGNOSIS — J039 Acute tonsillitis, unspecified: Secondary | ICD-10-CM | POA: Diagnosis not present

## 2022-01-22 DIAGNOSIS — J029 Acute pharyngitis, unspecified: Secondary | ICD-10-CM | POA: Diagnosis not present

## 2022-01-22 DIAGNOSIS — R509 Fever, unspecified: Secondary | ICD-10-CM | POA: Insufficient documentation

## 2022-01-22 LAB — POCT RAPID STREP A (OFFICE): Rapid Strep A Screen: NEGATIVE

## 2022-01-22 MED ORDER — ONDANSETRON 8 MG PO TBDP: 8.0000 mg | ORAL_TABLET | Freq: Three times a day (TID) | ORAL | 0 refills | Status: DC | PRN

## 2022-01-22 MED ORDER — GUAIFENESIN 400 MG PO TABS: ORAL_TABLET | ORAL | 0 refills | Status: DC

## 2022-01-22 MED ORDER — ONDANSETRON 8 MG PO TBDP
8.0000 mg | ORAL_TABLET | Freq: Once | ORAL | Status: AC
Start: 2022-01-22 — End: 2022-01-22
  Administered 2022-01-22: 8 mg via ORAL

## 2022-01-22 MED ORDER — ACETAMINOPHEN 325 MG PO TABS
975.0000 mg | ORAL_TABLET | Freq: Once | ORAL | Status: AC
  Administered 2022-01-22: 975 mg via ORAL

## 2022-01-22 MED ORDER — OSELTAMIVIR PHOSPHATE 75 MG PO CAPS: 75.0000 mg | ORAL_CAPSULE | Freq: Two times a day (BID) | ORAL | 0 refills | Status: AC

## 2022-01-22 MED ORDER — ACETAMINOPHEN 500 MG PO TABS: 1000.0000 mg | ORAL_TABLET | Freq: Three times a day (TID) | ORAL | 0 refills | Status: DC | PRN

## 2022-01-22 MED ORDER — IBUPROFEN 400 MG PO TABS: 400.0000 mg | ORAL_TABLET | Freq: Three times a day (TID) | ORAL | 0 refills | Status: DC | PRN

## 2022-01-22 MED ORDER — PROMETHAZINE-DM 6.25-15 MG/5ML PO SYRP: 5.0000 mL | ORAL_SOLUTION | Freq: Four times a day (QID) | ORAL | 0 refills | Status: DC | PRN

## 2022-01-22 MED ORDER — IPRATROPIUM BROMIDE 0.06 % NA SOLN: 2.0000 | Freq: Four times a day (QID) | NASAL | 0 refills | Status: DC

## 2022-01-22 NOTE — ED Provider Notes (Signed)
?[facility] ? ? ? ?CSN: [id number] ?Arrival date & time: 01/22/22  1250 ?  ? ?HISTORY  ? ?Chief Complaint  ?Patient presents with  ? Dizziness  ? Headache  ? Nausea  ? ?HPI ?Jaime Ramos is a 26 y.o. female. Patient complains of a 2-day history of dizziness, headache and nausea.  Patient states she does not have much of an appetite either.  Patient is febrile on arrival, also tachycardic.  Patient was provided with Tylenol and Zofran on arrival.  Patient reports no known sick contacts.  Patient states she has not had any like this in the past.  Patient states she is fatigued, has bilateral ear pain, sore throat, pain with swallowing and body ache as well. ? ?The history is provided by the patient.  ?Past Medical History:  ?Diagnosis Date  ? Abnormal Pap smear of cervix 12/20/2016  ? 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 Anxiety   ? Asthma   ? Chlamydia   ? COVID   ? dec 2021 lost taste, smell  H/A, fever for 10 days. 08/02/2021  ? Depression   ? doing good  ? Genital herpes   ? Obesity   ? Pre-diabetes   ? Precocious puberty   ? Trichomonal vaginitis   ? Wears glasses   ? ?Patient Active Problem List  ? Diagnosis Date Noted  ? Trichomoniasis 09/23/2018  ? Abnormal Pap smear of cervix 12/20/2016  ? MDD (major depressive disorder), recurrent episode, moderate ([place]) 01/25/2016  ? Chlamydia 04/27/2015  ? Nexplanon removal 02/09/2015  ? Preterm delivery 10/10/2013  ? Preterm labor 10/09/2013  ? Depression 10/02/2013  ? Vaginal bleeding before [redacted] weeks gestation 10/02/2013  ? Acanthosis 05/28/2012  ? Menorrhagia with regular cycle 07/31/2011  ? Goiter 07/31/2011  ? Prediabetes   ? Obesity 05/17/2011  ? ?Past Surgical History:  ?Procedure Laterality Date  ? DILATATION & CURETTAGE/HYSTEROSCOPY WITH MYOSURE N/A 08/10/2021  ? Procedure: [place];  Surgeon: Armandina Stammer, DO;  Location: [facility];  Service: Gynecology;  Laterality: N/A;  ? DILATION AND CURETTAGE OF  UTERUS N/A 11/09/2014  ? Procedure: DILATATION AND CURETTAGE;  Surgeon: Emily Filbert, MD;  Location: [facility];  Service: Gynecology;  Laterality: N/A;  ? ?OB History   ? ? Gravida  ?3  ? Para  ?1  ? Term  ?   ? Preterm  ?1  ? AB  ?2  ? Living  ?0  ?  ? ? SAB  ?1  ? IAB  ?   ? Ectopic  ?1  ? Multiple  ?   ? Live Births  ?1  ?   ?  ?  ? ?Home Medications   ? ?Prior to Admission medications   ?Medication Sig Start Date End Date Taking? Authorizing Provider  ?fluticasone (FLONASE) 50 MCG/ACT nasal spray Place 1 spray into both nostrils daily for 14 days. 03/07/20 03/24/20  Emerson Monte, FNP  ? ?Family History ?Family History  ?Problem Relation Age of Onset  ? Diabetes Mother   ? Hypertension Mother   ? Obesity Mother   ? Fibroids Mother   ? Cancer Mother   ?     thyroid  ? Obesity Father   ? Obesity Maternal Aunt   ? Fibroids Maternal Aunt   ? Obesity Maternal Uncle   ? Obesity Paternal Aunt   ? Diabetes Maternal Grandmother   ? Obesity Maternal Grandmother   ? Hypertension Maternal Grandmother   ?  Diabetes Maternal Grandfather   ? Obesity Maternal Grandfather   ? Stroke Maternal Grandfather   ? Heart disease Maternal Grandfather   ? Hypertension Maternal Grandfather   ? Obesity Paternal Grandfather   ? Heart disease Paternal Grandfather   ? Bipolar disorder Brother   ? Bipolar disorder Brother   ? ?Social History ?Social History  ? ?Tobacco Use  ? Smoking status: Never  ? Smokeless tobacco: Never  ?Vaping Use  ? Vaping Use: Never used  ?Substance Use Topics  ? Alcohol use: No  ? Drug use: Never  ? ?Allergies   ?Patient has no known allergies. ? ?Review of Systems ?Review of Systems ?Pertinent findings noted in history of present illness.  ? ?Physical Exam ?Triage Vital Signs ?ED Triage Vitals  ?Enc Vitals Group  ?   BP 09/01/21 0827 (!) 147/82  ?   Pulse Rate 09/01/21 0827 72  ?   Resp 09/01/21 0827 18  ?   Temp 09/01/21 0827 98.3 ?F (36.8 ?C)  ?   Temp Source 09/01/21 0827 Oral  ?   SpO2 09/01/21 0827 98 %  ?   Weight  --   ?   Height --   ?   Head Circumference --   ?   Peak Flow --   ?   Pain Score 09/01/21 0826 5  ?   Pain Loc --   ?   Pain Edu? --   ?   Excl. in [place]? --   ?No data found. ? ?Updated Vital Signs ?BP 121/86 (BP Location: Left Arm)   Pulse (!) 121   Temp (!) 101.2 ?F (38.4 ?C) (Oral)   Resp 20   LMP 12/20/2021 (Approximate)   SpO2 97%  ? ?Physical Exam ?Constitutional:   ?   Appearance: She is ill-appearing.  ?HENT:  ?   Head: Normocephalic and atraumatic.  ?   Salivary Glands: Right salivary gland is not diffusely enlarged or tender. Left salivary gland is not diffusely enlarged or tender.  ?   Right Ear: Tympanic membrane, ear canal and external ear normal.  ?   Left Ear: Tympanic membrane, ear canal and external ear normal.  ?   Nose: Mucosal edema, congestion and rhinorrhea present. Rhinorrhea is clear.  ?   Right Sinus: No maxillary sinus tenderness or frontal sinus tenderness.  ?   Left Sinus: No maxillary sinus tenderness.  ?   Mouth/Throat:  ?   Mouth: Mucous membranes are moist.  ?   Pharynx: Uvula midline. Pharyngeal swelling and uvula swelling present. No oropharyngeal exudate or posterior oropharyngeal erythema.  ?   Tonsils: No tonsillar exudate. 3+ on the right. 3+ on the left.  ?Cardiovascular:  ?   Rate and Rhythm: Normal rate and regular rhythm.  ?   Pulses: Normal pulses.  ?Pulmonary:  ?   Effort: Pulmonary effort is normal. No accessory muscle usage, prolonged expiration or respiratory distress.  ?   Breath sounds: No stridor. No wheezing, rhonchi or rales.  ?   Comments: Turbulent breath sounds throughout without wheeze, rale, rhonchi. ?Abdominal:  ?   General: Abdomen is flat. Bowel sounds are normal.  ?   Palpations: Abdomen is soft.  ?Musculoskeletal:     ?   General: Normal range of motion.  ?Lymphadenopathy:  ?   Cervical: Cervical adenopathy present.  ?   Right cervical: Superficial cervical adenopathy and posterior cervical adenopathy present.  ?   Left cervical: Superficial cervical  adenopathy and posterior cervical  adenopathy present.  ?Skin: ?   General: Skin is warm and dry.  ?Neurological:  ?   General: No focal deficit present.  ?   Mental Status: She is alert and oriented to person, place, and time.  ?   Motor: Motor function is intact.  ?   Coordination: Coordination is intact.  ?   Gait: Gait is intact.  ?   Deep Tendon Reflexes: Reflexes are normal and symmetric.  ?Psychiatric:     ?   Attention and Perception: Attention and perception normal.     ?   Mood and Affect: Mood and affect normal.     ?   Speech: Speech normal.     ?   Behavior: Behavior normal. Behavior is cooperative.     ?   Thought Content: Thought content normal.  ? ? ?Visual Acuity ?Right Eye Distance:   ?Left Eye Distance:   ?Bilateral Distance:   ? ?Right Eye Near:   ?Left Eye Near:    ?Bilateral Near:    ? ?UC Couse / Diagnostics / Procedures:  ?  ?EKG ? ?Radiology ?No results found. ? ?Procedures ?Procedures (including critical care time) ? ?UC Diagnoses / Final Clinical Impressions(s)   ?I have reviewed the triage vital signs and the nursing notes. ? ?Pertinent labs & imaging results that were available during my care of the patient were reviewed by me and considered in my medical decision making (see chart for details).   ?Final diagnoses:  ?Fever, unspecified fever cause  ?Acute tonsillitis, unspecified etiology  ?Acute pharyngitis, unspecified etiology  ?Nausea without vomiting  ?Anorexia  ?Tachycardia  ?Generalized body aches  ?Viral illness  ? ?Rapid strep test today is negative.  COVID/flu test pending.  Patient will be started on Tamiflu empirically for presumed influenza, vital signs and physical exam findings while we await the results of her COVID flu test.  Patient advised that the results be posted to her MyChart and if positive will be relayed by phone.  Further advised that if COVID test is positive, she will be switched to Paxlovid instead.  Throat culture will be performed per protocol but I  believe this is unlikely, if positive patient will be provided with antibiotics as well.  Patient advised to continue ibuprofen and Tylenol for fever and body ache, patient provided with Zofran for nausea and Pro

## 2022-01-22 NOTE — Discharge Instructions (Addendum)
Your symptoms and physical exam findings are concerning for a viral respiratory infection.  You were tested for both COVID and influenza today, the result of your viral testing will be posted to your MyChart once it is complete, this typically takes 24 to 48 hours.  If there is a positive result, you will be contacted by phone with further recommendations, if any.  ? ?I recommend that you begin taking Tamiflu now for empiric treatment of presumed influenza based on the history provided to me today along with my physical exam findings. Tamiflu is an antiviral medication that decreases the severity, duration and transmissibility of influenza virus by preventing the virus from reproducing itself in your body.   ? ?If the influenza result is positive, please continue the full 5-day course of Tamiflu.  If the result is negative, please feel free to discontinue Tamiflu if you prefer but do keep in mind that Tamiflu can also be taken preventatively.  Finishing the full 5-day course will decrease the chances of catching influenza from anyone else and will not cause harm otherwise.  If your COVID-19 test is positive, you will discontinue Tamiflu and begin Paxlovid instead. ?  ?Your strep test today is negative.  Throat culture will be performed per our protocol.  The result of your throat culture will be posted to your MyChart once it is complete, this typically takes 3 to 5 days.  If there is a positive result, you will be contacted by phone and antibiotics will be prescribed for you. ?  ?Please see the list below for recommended medications, dosages and frequencies to provide relief of your current symptoms:   ? ?Ipratropium (Atrovent): This is an excellent nasal decongestant spray that does not cause rebound congestion, can be used up to 4 times daily as needed, instill 2 sprays into each nare with each use.  I have provided you with a prescription for this medication.    ?  ?Ibuprofen  (Advil, Motrin): This is a good  anti-inflammatory medication which addresses aches, pains and inflammation of the upper airways that causes sinus and nasal congestion as well as in the lower airways which makes your cough feel tight and sometimes burn.  I recommend that you take between 400 to 600 mg every 6-8 hours as needed, I have provided you with a prescription for 400 mg.    ?  ?Acetaminophen (Tylenol): This is a good fever reducer.  If your body temperature rises above 101.5 as measured with a thermometer, it is recommended that you take 1,000 mg every 8 hours until your temperature falls below 101.5, please not take more than 3,000 mg of acetaminophen either as a separate medication or as in ingredient in an over-the-counter cold/flu preparation within a 24-hour period.    ?  ?Guaifenesin (Robitussin, Mucinex): This is an expectorant.  This helps break up chest congestion and loosen up thick nasal drainage making phlegm and drainage more liquid and therefore easier to remove.  I recommend being 400 mg three times daily as needed.    ?  ?Promethazine DM: Promethazine is both a nasal decongestant and an antinausea medication that makes most patients feel fairly sleepy.  The DM is dextromethorphan, a cough suppressant found in many over-the-counter cough medications.  Please take 5 mL before bedtime to minimize your cough which will help you sleep better.  I have provided you with a prescription for this medication.    ? ?Ondansetron (Zofran): This is another antinausea medication that you can  take for your daytime nausea.  Please do not take this medication within 4 hours of taking Promethazine DM. ?  ?Conservative care is also recommended at this time.  This includes rest, pushing clear fluids and activity as tolerated.  Warm beverages such as teas and broths versus cold beverages/popsicles and frozen sherbet/sorbet are your choice, both warm and cold are beneficial.  You may also notice that your appetite is reduced; this is okay as long  as you are drinking plenty of clear fluids.  ?  ?Please follow-up within the next 3 to 5 days either with your primary care provider or urgent care if your symptoms do not resolve.  If you do not have a primary care provider, we will assist you in finding one. ?  ?Thank you for visiting urgent care today.  We appreciate the opportunity to participate in your care. ? ?

## 2022-01-22 NOTE — ED Triage Notes (Signed)
Pt c/o dizziness, headache, and nauseous.   ?Started: Saturday  ?

## 2022-01-24 LAB — COVID-19, FLU A+B NAA
Influenza A, NAA: NOT DETECTED
Influenza B, NAA: NOT DETECTED
SARS-CoV-2, NAA: NOT DETECTED

## 2022-01-25 LAB — CULTURE, GROUP A STREP (THRC)

## 2022-02-13 ENCOUNTER — Ambulatory Visit
Admission: RE | Admit: 2022-02-13 | Discharge: 2022-02-13 | Disposition: A | Discharge status: Home / Self Care | Payer: BC Managed Care – PPO | Source: Ambulatory Visit | Attending: Emergency Medicine | Admitting: Emergency Medicine

## 2022-02-13 VITALS — BP 110/77 | HR 111 | Temp 99.5°F | Resp 16

## 2022-02-13 DIAGNOSIS — J3089 Other allergic rhinitis: Secondary | ICD-10-CM

## 2022-02-13 DIAGNOSIS — J302 Other seasonal allergic rhinitis: Secondary | ICD-10-CM | POA: Diagnosis not present

## 2022-02-13 DIAGNOSIS — J4521 Mild intermittent asthma with (acute) exacerbation: Secondary | ICD-10-CM

## 2022-02-13 MED ORDER — ALBUTEROL SULFATE HFA 108 (90 BASE) MCG/ACT IN AERS
8.0000 | INHALATION_SPRAY | Freq: Once | RESPIRATORY_TRACT | Status: AC
  Administered 2022-02-13: 8 via RESPIRATORY_TRACT

## 2022-02-13 MED ORDER — DEXAMETHASONE SODIUM PHOSPHATE 10 MG/ML IJ SOLN
20.0000 mg | Freq: Once | INTRAMUSCULAR | Status: AC
  Administered 2022-02-13: 20 mg via INTRAMUSCULAR

## 2022-02-13 MED ORDER — LEVOCETIRIZINE DIHYDROCHLORIDE 5 MG PO TABS: 5.0000 mg | ORAL_TABLET | Freq: Every evening | ORAL | 2 refills | Status: AC

## 2022-02-13 MED ORDER — IPRATROPIUM BROMIDE 0.06 % NA SOLN: 2.0000 | Freq: Four times a day (QID) | NASAL | 0 refills | Status: DC

## 2022-02-13 MED ORDER — DEXAMETHASONE SODIUM PHOSPHATE 10 MG/ML IJ SOLN: 20.0000 mg | Freq: Once | INTRAMUSCULAR | Status: DC

## 2022-02-13 MED ORDER — MONTELUKAST SODIUM 10 MG PO TABS: 10.0000 mg | ORAL_TABLET | Freq: Every day | ORAL | 2 refills | Status: AC

## 2022-02-13 MED ORDER — ALBUTEROL SULFATE (2.5 MG/3ML) 0.083% IN NEBU: 2.5000 mg | INHALATION_SOLUTION | Freq: Once | RESPIRATORY_TRACT | Status: DC

## 2022-02-13 MED ORDER — ALBUTEROL SULFATE HFA 108 (90 BASE) MCG/ACT IN AERS: 2.0000 | INHALATION_SPRAY | Freq: Four times a day (QID) | RESPIRATORY_TRACT | 0 refills | Status: DC | PRN

## 2022-02-13 NOTE — ED Triage Notes (Addendum)
Patient c/o nasal congestion and nonproductive cough x 2 days.  ? ?Patient endorses wheezing at times.  ? ?Patient endorses LFT sided ear pain and LFT sided throat pain. ? ?Patient has taken Mucinex and Xyzal with no relief of symptoms.  ? ?History of Asthma.  ?

## 2022-02-13 NOTE — ED Provider Notes (Signed)
?[facility] ? ? ? ?CSN: [id number] ?Arrival date & time: 02/13/22  1306 ?  ? ?HISTORY  ? ?Chief Complaint  ?Patient presents with  ? Cough  ?  Cough, Congestion - Entered by patient  ? APPT 1320  ? Nasal Congestion  ? ?HPI ?Jaime Ramos is a 26 y.o. female. Patient c/o nasal congestion and nonproductive cough x 2 days.  ?  ?Patient endorses wheezing at times, some shortness of breath with exertion.  ?  ?Patient endorses LFT sided ear pain and LFT sided throat pain. ?  ?Patient has taken Mucinex and Xyzal with no relief of symptoms, states she takes Xyzal daily.  Patient adds that she stopped using Atrovent nasal spray a few days ago ?  ?Patient reports history of Asthma, currently does not have any albuterol on hand.  ? ?The history is provided by the patient.  ?Past Medical History:  ?Diagnosis Date  ? Abnormal Pap smear of cervix 12/20/2016  ? 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 Anxiety   ? Asthma   ? Chlamydia   ? COVID   ? dec 2021 lost taste, smell  H/A, fever for 10 days. 08/02/2021  ? Depression   ? doing good  ? Genital herpes   ? Obesity   ? Pre-diabetes   ? Precocious puberty   ? Trichomonal vaginitis   ? Wears glasses   ? ?Patient Active Problem List  ? Diagnosis Date Noted  ? Trichomoniasis 09/23/2018  ? Abnormal Pap smear of cervix 12/20/2016  ? MDD (major depressive disorder), recurrent episode, moderate (HCC) 01/25/2016  ? Chlamydia 04/27/2015  ? Nexplanon removal 02/09/2015  ? Preterm delivery 10/10/2013  ? Preterm labor 10/09/2013  ? Depression 10/02/2013  ? Vaginal bleeding before [redacted] weeks gestation 10/02/2013  ? Acanthosis 05/28/2012  ? Menorrhagia with regular cycle 07/31/2011  ? Goiter 07/31/2011  ? Prediabetes   ? Obesity 05/17/2011  ? ?Past Surgical History:  ?Procedure Laterality Date  ? DILATATION & CURETTAGE/HYSTEROSCOPY WITH MYOSURE N/A 08/10/2021  ? Procedure: DILATATION & CURETTAGE/HYSTEROSCOPY WITH MYOSURE;  Surgeon: Toy Baker, DO;  Location: [facility];   Service: Gynecology;  Laterality: N/A;  ? DILATION AND CURETTAGE OF UTERUS N/A 11/09/2014  ? Procedure: DILATATION AND CURETTAGE;  Surgeon: Allie Bossier, MD;  Location: [facility];  Service: Gynecology;  Laterality: N/A;  ? ?OB History   ? ? Gravida  ?3  ? Para  ?1  ? Term  ?   ? Preterm  ?1  ? AB  ?2  ? Living  ?0  ?  ? ? SAB  ?1  ? IAB  ?   ? Ectopic  ?1  ? Multiple  ?   ? Live Births  ?1  ?   ?  ?  ? ?Home Medications   ? ?Prior to Admission medications   ?Medication Sig Start Date End Date Taking? Authorizing Provider  ?ipratropium (ATROVENT) 0.06 % nasal spray Place 2 sprays into both nostrils 4 (four) times daily. As needed for nasal congestion, runny nose 01/22/22   Theadora Rama Scales, PA-C  ?fluticasone (FLONASE) 50 MCG/ACT nasal spray Place 1 spray into both nostrils daily for 14 days. 03/07/20 03/24/20  Durward Parcel, FNP  ? ?Family History ?Family History  ?Problem Relation Age of Onset  ? Diabetes Mother   ? Hypertension Mother   ? Obesity Mother   ? Fibroids Mother   ? Cancer Mother   ?     thyroid  ?  Obesity Father   ? Obesity Maternal Aunt   ? Fibroids Maternal Aunt   ? Obesity Maternal Uncle   ? Obesity Paternal Aunt   ? Diabetes Maternal Grandmother   ? Obesity Maternal Grandmother   ? Hypertension Maternal Grandmother   ? Diabetes Maternal Grandfather   ? Obesity Maternal Grandfather   ? Stroke Maternal Grandfather   ? Heart disease Maternal Grandfather   ? Hypertension Maternal Grandfather   ? Obesity Paternal Grandfather   ? Heart disease Paternal Grandfather   ? Bipolar disorder Brother   ? Bipolar disorder Brother   ? ?Social History ?Social History  ? ?Tobacco Use  ? Smoking status: Never  ? Smokeless tobacco: Never  ?Vaping Use  ? Vaping Use: Never used  ?Substance Use Topics  ? Alcohol use: No  ? Drug use: Never  ? ?Allergies   ?Patient has no known allergies. ? ?Review of Systems ?Review of Systems ?Pertinent findings noted in history of present illness.  ? ?Physical Exam ?Triage Vital  Signs ?ED Triage Vitals  ?Enc Vitals Group  ?   BP 09/01/21 0827 (!) 147/82  ?   Pulse Rate 09/01/21 0827 72  ?   Resp 09/01/21 0827 18  ?   Temp 09/01/21 0827 98.3 ?F (36.8 ?C)  ?   Temp Source 09/01/21 0827 Oral  ?   SpO2 09/01/21 0827 98 %  ?   Weight --   ?   Height --   ?   Head Circumference --   ?   Peak Flow --   ?   Pain Score 09/01/21 0826 5  ?   Pain Loc --   ?   Pain Edu? --   ?   Excl. in GC? --   ?No data found. ? ?Updated Vital Signs ?BP 110/77 (BP Location: Right Arm)   Pulse (!) 111   Temp 99.5 ?F (37.5 ?C) (Oral)   Resp 16   LMP  (LMP Unknown)   SpO2 99%  ? ?Physical Exam ?Vitals and nursing note reviewed.  ?Constitutional:   ?   General: She is not in acute distress. ?   Appearance: Normal appearance. She is not ill-appearing.  ?HENT:  ?   Head: Normocephalic and atraumatic.  ?   Salivary Glands: Right salivary gland is not diffusely enlarged or tender. Left salivary gland is not diffusely enlarged or tender.  ?   Right Ear: Ear canal and external ear normal. No drainage. A middle ear effusion is present. There is no impacted cerumen. Tympanic membrane is bulging. Tympanic membrane is not injected or erythematous.  ?   Left Ear: Ear canal and external ear normal. No drainage. A middle ear effusion is present. There is no impacted cerumen. Tympanic membrane is bulging. Tympanic membrane is not injected or erythematous.  ?   Ears:  ?   Comments: Bilateral EACs normal, both TMs bulging with clear fluid ?   Nose: Rhinorrhea present. No nasal deformity, septal deviation, signs of injury, nasal tenderness, mucosal edema or congestion. Rhinorrhea is clear.  ?   Right Nostril: Occlusion present. No foreign body, epistaxis or septal hematoma.  ?   Left Nostril: Occlusion present. No foreign body, epistaxis or septal hematoma.  ?   Right Turbinates: Enlarged, swollen and pale.  ?   Left Turbinates: Enlarged, swollen and pale.  ?   Right Sinus: No maxillary sinus tenderness or frontal sinus tenderness.   ?   Left Sinus: No maxillary sinus tenderness  or frontal sinus tenderness.  ?   Mouth/Throat:  ?   Lips: Pink. No lesions.  ?   Mouth: Mucous membranes are moist. No oral lesions.  ?   Pharynx: Oropharynx is clear. Uvula midline. No posterior oropharyngeal erythema or uvula swelling.  ?   Tonsils: No tonsillar exudate. 0 on the right. 0 on the left.  ?   Comments: Postnasal drip ?Eyes:  ?   General: Lids are normal.     ?   Right eye: No discharge.     ?   Left eye: No discharge.  ?   Extraocular Movements: Extraocular movements intact.  ?   Conjunctiva/sclera: Conjunctivae normal.  ?   Right eye: Right conjunctiva is not injected.  ?   Left eye: Left conjunctiva is not injected.  ?Neck:  ?   Trachea: Trachea and phonation normal.  ?Cardiovascular:  ?   Rate and Rhythm: Normal rate and regular rhythm.  ?   Pulses: Normal pulses.  ?   Heart sounds: Normal heart sounds. No murmur heard. ?  No friction rub. No gallop.  ?Pulmonary:  ?   Effort: Pulmonary effort is normal. No tachypnea, bradypnea, accessory muscle usage, prolonged expiration, respiratory distress or retractions.  ?   Breath sounds: No stridor, decreased air movement or transmitted upper airway sounds. Examination of the right-upper field reveals decreased breath sounds. Examination of the left-upper field reveals decreased breath sounds. Examination of the right-middle field reveals decreased breath sounds. Examination of the left-middle field reveals decreased breath sounds. Examination of the right-lower field reveals decreased breath sounds. Examination of the left-lower field reveals decreased breath sounds. Decreased breath sounds present. No wheezing, rhonchi or rales.  ?Chest:  ?   Chest wall: No tenderness.  ?Musculoskeletal:     ?   General: Normal range of motion.  ?   Cervical back: Normal range of motion and neck supple. Normal range of motion.  ?Lymphadenopathy:  ?   Cervical: No cervical adenopathy.  ?Skin: ?   General: Skin is warm and  dry.  ?   Findings: No erythema or rash.  ?Neurological:  ?   General: No focal deficit present.  ?   Mental Status: She is alert and oriented to person, place, and time.  ?Psychiatric:     ?   Mood and Affect: Mood no

## 2022-02-13 NOTE — Discharge Instructions (Addendum)
Your symptoms and my physical exam findings are concerning for exacerbation of your underlying allergies and asthma.  It is important that you begin your allergy and asthma regimen now and are consistent with taking medications exactly as prescribed.  Allergy medications are preventative and therefore only work well when they are taken daily, not "as needed". ?  ?Please see the list below for recommended medications, dosages and frequencies to provide relief of your current symptoms:   ?  ?Dexamethasone IM (Decadron):  To quickly address your significant respiratory inflammation, you were provided with an injection of Decadron in the office today.  You should continue to feel the full benefit of the steroid for the next 24 to 36 hours.  ? ?Xyzal (levocetirizine): This is an excellent second-generation antihistamine that helps to reduce respiratory inflammatory response to environmental allergens.  In some patients, this medication can cause daytime sleepiness so I recommend that you take 1 tablet daily at bedtime.   ?  ?Singulair (montelukast): This is a mast cell stabilizer that works well with antihistamines.  Mast cells are responsible for stimulating histamine production so you can imagine that if we can reduce the activity of your mast cells, then fewer histamines will be produced and inflammation caused by allergy exposure will be significantly reduced.  Recommend he take this medication at the same time you take your antihistamine. ?  ?Ipratropium (Atrovent): This is an excellent nasal decongestant spray that does not cause rebound congestion, please instill 2 sprays into each nare with each use.  Please use the spray up to 4 times daily as needed.  I have provided you with a prescription for this medication.    ?  ?ProAir, Ventolin, Proventil (albuterol): This inhaled medication contains a short acting beta agonist bronchodilator.  This medication works on the smooth muscle that opens and constricts of your  airways by relaxing the muscle.  The result of relaxation of the smooth muscle is increased air movement and improved work of breathing.  This is a short acting medication that can be used every 4-6 hours as needed for increased work of breathing, shortness of breath, wheezing and excessive coughing.  I have provided you with a prescription.  ?  ?If you find that you have not had significant relief of your symptoms in the next 7 to 10 days, please follow-up with your primary care provider for repeat evaluation and further recommendations. ?  ?Thank you for visiting urgent care today.  We appreciate the opportunity to participate in your care. ? ?

## 2022-02-18 DIAGNOSIS — R051 Acute cough: Secondary | ICD-10-CM | POA: Diagnosis not present

## 2022-02-18 DIAGNOSIS — J45909 Unspecified asthma, uncomplicated: Secondary | ICD-10-CM | POA: Diagnosis not present

## 2022-03-31 DIAGNOSIS — F439 Reaction to severe stress, unspecified: Secondary | ICD-10-CM | POA: Diagnosis not present

## 2022-04-07 DIAGNOSIS — F439 Reaction to severe stress, unspecified: Secondary | ICD-10-CM | POA: Diagnosis not present

## 2022-04-18 DIAGNOSIS — F439 Reaction to severe stress, unspecified: Secondary | ICD-10-CM | POA: Diagnosis not present

## 2022-04-27 ENCOUNTER — Ambulatory Visit (HOSPITAL_COMMUNITY)
Admission: EM | Admit: 2022-04-27 | Discharge: 2022-04-27 | Disposition: A | Discharge status: Home / Self Care | Payer: BC Managed Care – PPO | Attending: Family Medicine | Admitting: Family Medicine

## 2022-04-27 ENCOUNTER — Encounter (HOSPITAL_COMMUNITY): Payer: Self-pay | Admitting: Emergency Medicine

## 2022-04-27 DIAGNOSIS — J069 Acute upper respiratory infection, unspecified: Secondary | ICD-10-CM | POA: Diagnosis not present

## 2022-04-27 DIAGNOSIS — Z20822 Contact with and (suspected) exposure to covid-19: Secondary | ICD-10-CM | POA: Diagnosis not present

## 2022-04-27 DIAGNOSIS — B9789 Other viral agents as the cause of diseases classified elsewhere: Secondary | ICD-10-CM | POA: Insufficient documentation

## 2022-04-27 MED ORDER — ONDANSETRON 4 MG PO TBDP: 4.0000 mg | ORAL_TABLET | Freq: Three times a day (TID) | ORAL | 0 refills | Status: DC | PRN

## 2022-04-27 MED ORDER — ACETAMINOPHEN 325 MG PO TABS
650.0000 mg | ORAL_TABLET | Freq: Once | ORAL | Status: AC
  Administered 2022-04-27: 650 mg via ORAL

## 2022-04-27 MED ORDER — BENZONATATE 100 MG PO CAPS
100.0000 mg | ORAL_CAPSULE | Freq: Three times a day (TID) | ORAL | 0 refills | Status: DC | PRN
Start: 2022-04-27 — End: 2022-09-29

## 2022-04-27 MED ORDER — ACETAMINOPHEN 325 MG PO TABS
ORAL_TABLET | ORAL | Status: AC
Start: 2022-04-27 — End: ?
  Filled 2022-04-27: qty 2

## 2022-04-28 LAB — SARS CORONAVIRUS 2 (TAT 6-24 HRS): SARS Coronavirus 2: NEGATIVE

## 2022-04-30 ENCOUNTER — Emergency Department (HOSPITAL_COMMUNITY): Payer: BC Managed Care – PPO

## 2022-04-30 ENCOUNTER — Ambulatory Visit
Admission: EM | Admit: 2022-04-30 | Discharge: 2022-04-30 | Disposition: A | Discharge status: Home / Self Care | Payer: BC Managed Care – PPO | Attending: Internal Medicine | Admitting: Internal Medicine

## 2022-04-30 ENCOUNTER — Emergency Department (HOSPITAL_COMMUNITY)
Admission: EM | Admit: 2022-04-30 | Discharge: 2022-05-01 | Disposition: A | Discharge status: Home / Self Care | Payer: BC Managed Care – PPO | Attending: Student | Admitting: Student

## 2022-04-30 ENCOUNTER — Ambulatory Visit (INDEPENDENT_AMBULATORY_CARE_PROVIDER_SITE_OTHER): Payer: BC Managed Care – PPO

## 2022-04-30 ENCOUNTER — Encounter (HOSPITAL_COMMUNITY): Payer: Self-pay | Admitting: Emergency Medicine

## 2022-04-30 DIAGNOSIS — R197 Diarrhea, unspecified: Secondary | ICD-10-CM | POA: Diagnosis not present

## 2022-04-30 DIAGNOSIS — N390 Urinary tract infection, site not specified: Secondary | ICD-10-CM

## 2022-04-30 DIAGNOSIS — D72829 Elevated white blood cell count, unspecified: Secondary | ICD-10-CM | POA: Insufficient documentation

## 2022-04-30 DIAGNOSIS — R509 Fever, unspecified: Secondary | ICD-10-CM

## 2022-04-30 DIAGNOSIS — R059 Cough, unspecified: Secondary | ICD-10-CM | POA: Diagnosis not present

## 2022-04-30 DIAGNOSIS — Z7951 Long term (current) use of inhaled steroids: Secondary | ICD-10-CM | POA: Diagnosis not present

## 2022-04-30 DIAGNOSIS — R531 Weakness: Secondary | ICD-10-CM | POA: Diagnosis not present

## 2022-04-30 DIAGNOSIS — Z20822 Contact with and (suspected) exposure to covid-19: Secondary | ICD-10-CM | POA: Insufficient documentation

## 2022-04-30 DIAGNOSIS — J45909 Unspecified asthma, uncomplicated: Secondary | ICD-10-CM | POA: Insufficient documentation

## 2022-04-30 DIAGNOSIS — R0602 Shortness of breath: Secondary | ICD-10-CM

## 2022-04-30 DIAGNOSIS — J029 Acute pharyngitis, unspecified: Secondary | ICD-10-CM | POA: Diagnosis not present

## 2022-04-30 DIAGNOSIS — R1013 Epigastric pain: Secondary | ICD-10-CM | POA: Insufficient documentation

## 2022-04-30 DIAGNOSIS — B349 Viral infection, unspecified: Secondary | ICD-10-CM | POA: Diagnosis not present

## 2022-04-30 DIAGNOSIS — Z8616 Personal history of COVID-19: Secondary | ICD-10-CM | POA: Insufficient documentation

## 2022-04-30 DIAGNOSIS — N281 Cyst of kidney, acquired: Secondary | ICD-10-CM | POA: Diagnosis not present

## 2022-04-30 DIAGNOSIS — R112 Nausea with vomiting, unspecified: Secondary | ICD-10-CM

## 2022-04-30 DIAGNOSIS — R000 Tachycardia, unspecified: Secondary | ICD-10-CM | POA: Insufficient documentation

## 2022-04-30 LAB — COMPREHENSIVE METABOLIC PANEL
ALT: 83 U/L — ABNORMAL HIGH (ref 0–44)
AST: 114 U/L — ABNORMAL HIGH (ref 15–41)
Albumin: 3.3 g/dL — ABNORMAL LOW (ref 3.5–5.0)
Alkaline Phosphatase: 75 U/L (ref 38–126)
Anion gap: 11 (ref 5–15)
BUN: 5 mg/dL — ABNORMAL LOW (ref 6–20)
CO2: 22 mmol/L (ref 22–32)
Calcium: 8.4 mg/dL — ABNORMAL LOW (ref 8.9–10.3)
Chloride: 103 mmol/L (ref 98–111)
Creatinine, Ser: 0.8 mg/dL (ref 0.44–1.00)
GFR, Estimated: >60 mL/min (ref >60)
Glucose, Bld: 119 mg/dL — ABNORMAL HIGH (ref 70–99)
Potassium: 3.4 mmol/L — ABNORMAL LOW (ref 3.5–5.1)
Sodium: 136 mmol/L (ref 135–145)
Total Bilirubin: 0.6 mg/dL (ref 0.3–1.2)
Total Protein: 7.4 g/dL (ref 6.5–8.1)

## 2022-04-30 LAB — CBC WITH DIFFERENTIAL/PLATELET
Abs Immature Granulocytes: 0.04 10*3/uL (ref 0.00–0.07)
Basophils Absolute: 0.1 10*3/uL (ref 0.0–0.1)
Basophils Relative: 1 %
Eosinophils Absolute: 0.1 10*3/uL (ref 0.0–0.5)
Eosinophils Relative: 1 %
HCT: 36.9 % (ref 36.0–46.0)
Hemoglobin: 11.4 g/dL — ABNORMAL LOW (ref 12.0–15.0)
Immature Granulocytes: 0 %
Lymphocytes Relative: 48 %
Lymphs Abs: 5 10*3/uL — ABNORMAL HIGH (ref 0.7–4.0)
MCH: 23.6 pg — ABNORMAL LOW (ref 26.0–34.0)
MCHC: 30.9 g/dL (ref 30.0–36.0)
MCV: 76.4 fL — ABNORMAL LOW (ref 80.0–100.0)
Monocytes Absolute: 0.6 10*3/uL (ref 0.1–1.0)
Monocytes Relative: 5 %
Neutro Abs: 4.8 10*3/uL (ref 1.7–7.7)
Neutrophils Relative %: 45 %
Platelets: 210 10*3/uL (ref 150–400)
RBC: 4.83 MIL/uL (ref 3.87–5.11)
RDW: 16.2 % — ABNORMAL HIGH (ref 11.5–15.5)
WBC: 10.6 10*3/uL — ABNORMAL HIGH (ref 4.0–10.5)
nRBC: 0 % (ref 0.0–0.2)

## 2022-04-30 LAB — URINALYSIS, ROUTINE W REFLEX MICROSCOPIC
Glucose, UA: NEGATIVE mg/dL
Ketones, ur: 5 mg/dL — AB
Nitrite: NEGATIVE
Protein, ur: 100 mg/dL — AB
RBC / HPF: >50 RBC/hpf — ABNORMAL HIGH (ref 0–5)
Specific Gravity, Urine: 1.038 — ABNORMAL HIGH (ref 1.005–1.030)
pH: 5 (ref 5.0–8.0)

## 2022-04-30 LAB — I-STAT BETA HCG BLOOD, ED (MC, WL, AP ONLY): I-stat hCG, quantitative: <5 m[IU]/mL (ref <5)

## 2022-04-30 LAB — GROUP A STREP BY PCR: Group A Strep by PCR: NOT DETECTED

## 2022-04-30 LAB — LIPASE, BLOOD: Lipase: 28 U/L (ref 11–51)

## 2022-04-30 LAB — SARS CORONAVIRUS 2 BY RT PCR: SARS Coronavirus 2 by RT PCR: NEGATIVE

## 2022-04-30 MED ORDER — ONDANSETRON HCL 4 MG/2ML IJ SOLN
4.0000 mg | Freq: Once | INTRAMUSCULAR | Status: AC
  Administered 2022-04-30: 4 mg via INTRAMUSCULAR

## 2022-04-30 MED ORDER — ONDANSETRON HCL 4 MG/2ML IJ SOLN
4.0000 mg | Freq: Once | INTRAMUSCULAR | Status: AC
  Administered 2022-04-30: 4 mg via INTRAVENOUS
  Filled 2022-04-30: qty 2

## 2022-04-30 MED ORDER — IOHEXOL 300 MG/ML  SOLN
100.0000 mL | Freq: Once | INTRAMUSCULAR | Status: AC | PRN
  Administered 2022-04-30: 100 mL via INTRAVENOUS

## 2022-04-30 MED ORDER — ACETAMINOPHEN 325 MG PO TABS: 650.0000 mg | ORAL_TABLET | Freq: Once | ORAL | Status: DC

## 2022-04-30 MED ORDER — SODIUM CHLORIDE (PF) 0.9 % IJ SOLN
INTRAMUSCULAR | Status: AC
  Filled 2022-04-30: qty 50

## 2022-04-30 MED ORDER — ACETAMINOPHEN 500 MG PO TABS
1000.0000 mg | ORAL_TABLET | Freq: Once | ORAL | Status: AC
  Administered 2022-04-30: 1000 mg via ORAL

## 2022-04-30 MED ORDER — LACTATED RINGERS IV BOLUS
1000.0000 mL | Freq: Once | INTRAVENOUS | Status: AC
  Administered 2022-04-30: 1000 mL via INTRAVENOUS

## 2022-04-30 NOTE — ED Provider Notes (Signed)
[name]    CSN: [id number] Arrival date & time: 04/30/22  1844      History   Chief Complaint Chief Complaint  Patient presents with   Fever    HPI Jaime Ramos is a 26 y.o. female.   Patient presents with persistent fever, nausea, vomiting, diarrhea, nasal congestion, cough, dizziness, shortness of breath that started about 3 to 4 days ago.  Patient was seen in urgent care on 6/23 for same symptoms.  She was diagnosed with viral illness and prescribed ondansetron and benzonatate with minimal improvement.  She reports that she has not been able to keep any food or fluids down.  Denies blood in stool or emesis.  She states that she has been taking Tylenol and Motrin for fever but fever has been persistent despite this.  She has also been having difficulty keeping medications down.  Denies any known sick contacts.  Tmax at home was 101.  Patient has intermittent shortness of breath but denies any chest pain.  She does have history of asthma and has been having to use her albuterol inhaler more often while being sick.   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 08/02/2021   Depression    doing good   Genital herpes    Obesity    Pre-diabetes    Precocious puberty    Trichomonal vaginitis    Wears glasses     Patient Active Problem List   Diagnosis Date Noted   Trichomoniasis 09/23/2018   Abnormal Pap smear of cervix 12/20/2016   MDD (major depressive disorder), recurrent episode, moderate (HCC) 01/25/2016   Chlamydia 04/27/2015   Nexplanon removal 02/09/2015   Preterm delivery 10/10/2013   Preterm labor 10/09/2013   Depression 10/02/2013   Vaginal bleeding before [redacted] weeks gestation 10/02/2013   Acanthosis 05/28/2012   Menorrhagia with regular cycle 07/31/2011   Goiter 07/31/2011    Prediabetes    Obesity 05/17/2011    Past Surgical History:  Procedure Laterality Date   DILATATION & CURETTAGE/HYSTEROSCOPY WITH MYOSURE N/A 08/10/2021   Procedure: DILATATION & CURETTAGE/HYSTEROSCOPY WITH MYOSURE;  Surgeon: Toy Baker, DO;  Location: [facility];  Service: Gynecology;  Laterality: N/A;   DILATION AND CURETTAGE OF UTERUS N/A 11/09/2014   Procedure: DILATATION AND CURETTAGE;  Surgeon: Allie Bossier, MD;  Location: [facility];  Service: Gynecology;  Laterality: N/A;    OB History     Gravida  3   Para  1   Term      Preterm  1   AB  2   Living  0      SAB  1   IAB      Ectopic  1   Multiple      Live Births  1            Home Medications    Prior to Admission medications   Medication Sig Start Date End Date Taking? Authorizing Provider  albuterol (VENTOLIN HFA) 108 (90 Base) MCG/ACT inhaler Inhale 2 puffs into the lungs every 6 (six) hours as needed for wheezing or shortness of breath (Cough). 02/13/22   Theadora Rama Scales, PA-C  benzonatate (TESSALON) 100 MG capsule Take 1 capsule (100 mg total)  by mouth 3 (three) times daily as needed for cough. 04/27/22   Zenia Resides, MD  ipratropium (ATROVENT) 0.06 % nasal spray Place 2 sprays into both nostrils 4 (four) times daily. As needed for nasal congestion, runny nose 02/13/22   Theadora Rama Scales, PA-C  levocetirizine (XYZAL) 5 MG tablet Take 1 tablet (5 mg total) by mouth every evening. 02/13/22 05/14/22  Theadora Rama Scales, PA-C  montelukast (SINGULAIR) 10 MG tablet Take 1 tablet (10 mg total) by mouth at bedtime. 02/13/22 05/14/22  Theadora Rama Scales, PA-C  ondansetron (ZOFRAN-ODT) 4 MG disintegrating tablet Take 1 tablet (4 mg total) by mouth every 8 (eight) hours as needed for nausea or vomiting. 04/27/22   Zenia Resides, MD  fluticasone (FLONASE) 50 MCG/ACT nasal spray Place 1 spray into both nostrils daily for 14 days. 03/07/20 03/24/20  Durward Parcel, FNP     Family History Family History  Problem Relation Age of Onset   Diabetes Mother    Hypertension Mother    Obesity Mother    Fibroids Mother    Cancer Mother        thyroid   Obesity Father    Obesity Maternal Aunt    Fibroids Maternal Aunt    Obesity Maternal Uncle    Obesity Paternal Aunt    Diabetes Maternal Grandmother    Obesity Maternal Grandmother    Hypertension Maternal Grandmother    Diabetes Maternal Grandfather    Obesity Maternal Grandfather    Stroke Maternal Grandfather    Heart disease Maternal Grandfather    Hypertension Maternal Grandfather    Obesity Paternal Grandfather    Heart disease Paternal Grandfather    Bipolar disorder Brother    Bipolar disorder Brother     Social History Social History   Tobacco Use   Smoking status: Never   Smokeless tobacco: Never  Vaping Use   Vaping Use: Never used  Substance Use Topics   Alcohol use: No   Drug use: Never     Allergies   Patient has no known allergies.   Review of Systems Review of Systems Per HPI  Physical Exam Triage Vital Signs ED Triage Vitals  Enc Vitals Group     BP 04/30/22 1933 134/84     Pulse Rate 04/30/22 1922 (!) 130     Resp --      Temp 04/30/22 1922 (!) 102.6 F (39.2 C)     Temp Source 04/30/22 1922 Oral     SpO2 04/30/22 1922 97 %     Weight --      Height --      Head Circumference --      Peak Flow --      Pain Score 04/30/22 1932 4     Pain Loc --      Pain Edu? --      Excl. in GC? --    No data found.  Updated Vital Signs BP 134/84   Pulse (!) 127   Temp (!) 103 F (39.4 C)   LMP 04/29/2022 (Exact Date)   SpO2 95%   Visual Acuity Right Eye Distance:   Left Eye Distance:   Bilateral Distance:    Right Eye Near:   Left Eye Near:    Bilateral Near:     Physical Exam Constitutional:      General: She is not in acute distress.    Appearance: Normal appearance. She is ill-appearing. She is not toxic-appearing or diaphoretic.  HENT:  Head: Normocephalic and atraumatic.     Right Ear: Tympanic membrane and ear canal normal.     Left Ear: Tympanic membrane and ear canal normal.     Nose: Congestion present.     Mouth/Throat:     Mouth: Mucous membranes are dry.     Pharynx: No posterior oropharyngeal erythema.  Eyes:     Extraocular Movements: Extraocular movements intact.     Conjunctiva/sclera: Conjunctivae normal.     Pupils: Pupils are equal, round, and reactive to light.  Cardiovascular:     Rate and Rhythm: Normal rate and regular rhythm.     Pulses: Normal pulses.     Heart sounds: Normal heart sounds.  Pulmonary:     Effort: Pulmonary effort is normal. No respiratory distress.     Breath sounds: Normal breath sounds. No stridor. No wheezing, rhonchi or rales.  Abdominal:     General: Abdomen is flat. Bowel sounds are normal.     Palpations: Abdomen is soft.  Musculoskeletal:        General: Normal range of motion.     Cervical back: Normal range of motion.  Skin:    General: Skin is warm and dry.  Neurological:     General: No focal deficit present.     Mental Status: She is alert and oriented to person, place, and time. Mental status is at baseline.  Psychiatric:        Mood and Affect: Mood normal.        Behavior: Behavior normal.      UC Treatments / Results  Labs (all labs ordered are listed, but only abnormal results are displayed) Labs Reviewed - No data to display  EKG   Radiology DG Chest 2 View  Result Date: 04/30/2022 CLINICAL DATA:  Shortness of breath. EXAM: CHEST - 2 VIEW COMPARISON:  Chest x-ray 05/09/2017 FINDINGS: The heart size and mediastinal contours are within normal limits. Both lungs are clear. The visualized skeletal structures are unremarkable. IMPRESSION: No active cardiopulmonary disease. Electronically Signed   By: Darliss Cheney M.D.   On: 04/30/2022 19:50    Procedures Procedures (including critical care time)  Medications Ordered in UC Medications   ondansetron (ZOFRAN) injection 4 mg (4 mg Intramuscular Given 04/30/22 1941)  acetaminophen (TYLENOL) tablet 1,000 mg (1,000 mg Oral Given 04/30/22 1941)    Initial Impression / Assessment and Plan / UC Course  I have reviewed the triage vital signs and the nursing notes.  Pertinent labs & imaging results that were available during my care of the patient were reviewed by me and considered in my medical decision making (see chart for details).     Suspect viral cause of patient's symptoms.  Temp was almost 103 in urgent care.  IM zofran also administered with minimal improvement. Tylenol administered with no improvement.  Chest x-ray was negative for any acute cardiopulmonary process.  There is concern for dehydration and symptoms being refractory to medications as well as associated shortness of breath.  Do think the patient needs more extensive evaluation and management at the ER given tshe may need IV hydration as well.  Patient advised that she will need to go to the ER for further evaluation and management.  Patient was agreeable  with plan.  Vital signs stable at discharge.  Agree with patient self transport to the hospital. Final Clinical Impressions(s) / UC Diagnoses   Final diagnoses:  Persistent fever  Viral illness  Nausea vomiting and diarrhea  Generalized weakness  Discharge Instructions      Go to the emergency department as soon as you leave urgent care for further evaluation and management.    ED Prescriptions   None    PDMP not reviewed this encounter.   Gustavus Bryant, [place] 04/30/22 2015

## 2022-04-30 NOTE — ED Notes (Signed)
 Patient is being discharged from the Urgent Care and sent to the Emergency Department via pov with mother . Per mound, np, patient is in need of higher level of care due to vitals not improving. Patient is aware and verbalizes understanding of plan of care.  Vitals:   04/30/22 1933 04/30/22 2000  BP: 134/84   Pulse: (!) 136 (!) 127  Temp:  (!) 103 F (39.4 C)  SpO2: 95%

## 2022-04-30 NOTE — ED Triage Notes (Signed)
Patient presents to Urgent Care with complaints of fever, nausea, vomiting, diarrhea, congestion, cough and lightheadedness since 3 days ago. Patient reports last normal bm 4 days ago before she got sick. Pt reports being able to keep liquid iv down but vomited up her lunch.   Pt educated on brat diet.

## 2022-05-01 DIAGNOSIS — N281 Cyst of kidney, acquired: Secondary | ICD-10-CM | POA: Diagnosis not present

## 2022-05-01 MED ORDER — CEFADROXIL 500 MG PO CAPS: 500.0000 mg | ORAL_CAPSULE | Freq: Two times a day (BID) | ORAL | 0 refills | Status: DC

## 2022-05-04 ENCOUNTER — Ambulatory Visit (INDEPENDENT_AMBULATORY_CARE_PROVIDER_SITE_OTHER): Payer: BC Managed Care – PPO

## 2022-05-04 ENCOUNTER — Ambulatory Visit
Admission: EM | Admit: 2022-05-04 | Discharge: 2022-05-04 | Disposition: A | Discharge status: Home / Self Care | Payer: BC Managed Care – PPO | Attending: Internal Medicine | Admitting: Internal Medicine

## 2022-05-04 DIAGNOSIS — R053 Chronic cough: Secondary | ICD-10-CM | POA: Diagnosis not present

## 2022-05-04 DIAGNOSIS — R509 Fever, unspecified: Secondary | ICD-10-CM | POA: Insufficient documentation

## 2022-05-04 DIAGNOSIS — R059 Cough, unspecified: Secondary | ICD-10-CM | POA: Diagnosis not present

## 2022-05-04 LAB — POCT URINALYSIS DIP (MANUAL ENTRY)
Bilirubin, UA: NEGATIVE
Blood, UA: NEGATIVE
Glucose, UA: NEGATIVE mg/dL
Ketones, POC UA: NEGATIVE mg/dL
Leukocytes, UA: NEGATIVE
Nitrite, UA: NEGATIVE
Protein Ur, POC: NEGATIVE mg/dL
Spec Grav, UA: 1.01 (ref 1.010–1.025)
Urobilinogen, UA: 4 E.U./dL — AB
pH, UA: 7 (ref 5.0–8.0)

## 2022-05-04 MED ORDER — PREDNISONE 10 MG (21) PO TBPK
ORAL_TABLET | Freq: Every day | ORAL | 0 refills | Status: DC
Start: 2022-05-04 — End: 2022-09-29

## 2022-05-04 MED ORDER — ACETAMINOPHEN 500 MG PO TABS
1000.0000 mg | ORAL_TABLET | Freq: Once | ORAL | Status: AC
  Administered 2022-05-04: 1000 mg via ORAL

## 2022-05-04 MED ORDER — AMOXICILLIN-POT CLAVULANATE 875-125 MG PO TABS: 1.0000 | ORAL_TABLET | Freq: Two times a day (BID) | ORAL | 0 refills | Status: DC

## 2022-05-04 NOTE — ED Provider Notes (Addendum)
[name]    CSN: [id number] Arrival date & time: 05/04/22  1620      History   Chief Complaint Chief Complaint  Patient presents with   Fever   Cough    HPI Jaime Ramos is a 26 y.o. female.   Patient presents for persistent fever, cough, chest congestion that started about a week ago.  Patient was originally seen on 04/27/2022 and was diagnosed with a viral infection.  COVID test was negative at that time.  She returned on 6/26 with high fever and concern for dehydration so she was sent to ER for further evaluation and management.  She was put on antibiotic for suspected urinary tract infection and was sent home with supportive care.  Patient reports that the fever and symptoms have been persistent and Tmax at home was 101.  She has also felt very fatigued.  Patient does have a history of asthma. Last tylenol dose was early this morning per patient.    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 08/02/2021   Depression    doing good   Genital herpes    Obesity    Pre-diabetes    Precocious puberty    Trichomonal vaginitis    Wears glasses     Patient Active Problem List   Diagnosis Date Noted   Trichomoniasis 09/23/2018   Abnormal Pap smear of cervix 12/20/2016   MDD (major depressive disorder), recurrent episode, moderate (HCC) 01/25/2016   Chlamydia 04/27/2015   Nexplanon removal 02/09/2015   Preterm delivery 10/10/2013   Preterm labor 10/09/2013   Depression 10/02/2013   Vaginal bleeding before [redacted] weeks gestation 10/02/2013   Acanthosis 05/28/2012   Menorrhagia with regular cycle 07/31/2011   Goiter 07/31/2011   Prediabetes    Obesity 05/17/2011    Past Surgical History:  Procedure Laterality Date   DILATATION & CURETTAGE/HYSTEROSCOPY WITH MYOSURE N/A 08/10/2021    Procedure: DILATATION & CURETTAGE/HYSTEROSCOPY WITH MYOSURE;  Surgeon: Toy Baker, DO;  Location: [facility];  Service: Gynecology;  Laterality: N/A;   DILATION AND CURETTAGE OF UTERUS N/A 11/09/2014   Procedure: DILATATION AND CURETTAGE;  Surgeon: Allie Bossier, MD;  Location: [facility];  Service: Gynecology;  Laterality: N/A;    OB History     Gravida  3   Para  1   Term      Preterm  1   AB  2   Living  0      SAB  1   IAB      Ectopic  1   Multiple      Live Births  1            Home Medications    Prior to Admission medications   Medication Sig Start Date End Date Taking? Authorizing Provider  amoxicillin-clavulanate (AUGMENTIN) 875-125 MG tablet Take 1 tablet by mouth every 12 (twelve) hours. 05/04/22  Yes , Rolly Salter E, FNP  predniSONE (STERAPRED UNI-PAK 21 TAB) 10 MG (21) TBPK tablet Take by mouth daily. Take 6 tabs by mouth daily  for 2 days, then 5 tabs for 2 days, then 4 tabs for 2 days, then 3 tabs for 2 days, 2 tabs for 2 days, then 1  tab by mouth daily for 2 days 05/04/22  Yes , Acie Fredrickson, FNP  albuterol (VENTOLIN HFA) 108 (90 Base) MCG/ACT inhaler Inhale 2 puffs into the lungs every 6 (six) hours as needed for wheezing or shortness of breath (Cough). 02/13/22   Theadora Rama Scales, PA-C  benzonatate (TESSALON) 100 MG capsule Take 1 capsule (100 mg total) by mouth 3 (three) times daily as needed for cough. 04/27/22   Zenia Resides, MD  ipratropium (ATROVENT) 0.06 % nasal spray Place 2 sprays into both nostrils 4 (four) times daily. As needed for nasal congestion, runny nose 02/13/22   Theadora Rama Scales, PA-C  levocetirizine (XYZAL) 5 MG tablet Take 1 tablet (5 mg total) by mouth every evening. 02/13/22 05/14/22  Theadora Rama Scales, PA-C  montelukast (SINGULAIR) 10 MG tablet Take 1 tablet (10 mg total) by mouth at bedtime. 02/13/22 05/14/22  Theadora Rama Scales, PA-C  ondansetron (ZOFRAN-ODT) 4 MG disintegrating tablet Take  1 tablet (4 mg total) by mouth every 8 (eight) hours as needed for nausea or vomiting. 04/27/22   Zenia Resides, MD  fluticasone (FLONASE) 50 MCG/ACT nasal spray Place 1 spray into both nostrils daily for 14 days. 03/07/20 03/24/20  Durward Parcel, FNP    Family History Family History  Problem Relation Age of Onset   Diabetes Mother    Hypertension Mother    Obesity Mother    Fibroids Mother    Cancer Mother        thyroid   Obesity Father    Obesity Maternal Aunt    Fibroids Maternal Aunt    Obesity Maternal Uncle    Obesity Paternal Aunt    Diabetes Maternal Grandmother    Obesity Maternal Grandmother    Hypertension Maternal Grandmother    Diabetes Maternal Grandfather    Obesity Maternal Grandfather    Stroke Maternal Grandfather    Heart disease Maternal Grandfather    Hypertension Maternal Grandfather    Obesity Paternal Grandfather    Heart disease Paternal Grandfather    Bipolar disorder Brother    Bipolar disorder Brother     Social History Social History   Tobacco Use   Smoking status: Never   Smokeless tobacco: Never  Vaping Use   Vaping Use: Never used  Substance Use Topics   Alcohol use: No   Drug use: Never     Allergies   Patient has no known allergies.   Review of Systems Review of Systems Per HPI  Physical Exam Triage Vital Signs ED Triage Vitals [05/04/22 1645]  Enc Vitals Group     BP      Pulse Rate (!) 125     Resp 20     Temp (!) 103.1 F (39.5 C)     Temp Source Oral     SpO2 96 %     Weight      Height      Head Circumference      Peak Flow      Pain Score 10     Pain Loc      Pain Edu?      Excl. in GC?    No data found.  Updated Vital Signs BP 140/90   Pulse (!) 125   Temp 99.6 F (37.6 C)   Resp 20   LMP 04/29/2022 (Exact Date)   SpO2 96%   Visual Acuity Right Eye Distance:   Left Eye Distance:   Bilateral Distance:    Right Eye Near:  Left Eye Near:    Bilateral Near:     Physical  Exam Constitutional:      General: She is not in acute distress.    Appearance: Normal appearance. She is not toxic-appearing or diaphoretic.  HENT:     Head: Normocephalic and atraumatic.     Right Ear: Tympanic membrane and ear canal normal.     Left Ear: Tympanic membrane and ear canal normal.     Nose: Congestion present.     Mouth/Throat:     Mouth: Mucous membranes are moist.     Pharynx: No posterior oropharyngeal erythema.  Eyes:     Extraocular Movements: Extraocular movements intact.     Conjunctiva/sclera: Conjunctivae normal.     Pupils: Pupils are equal, round, and reactive to light.  Cardiovascular:     Rate and Rhythm: Normal rate and regular rhythm.     Pulses: Normal pulses.     Heart sounds: Normal heart sounds.  Pulmonary:     Effort: Pulmonary effort is normal. No respiratory distress.     Breath sounds: Normal breath sounds. No stridor. No wheezing, rhonchi or rales.     Comments: Harsh cough on exam.  Abdominal:     General: Abdomen is flat. Bowel sounds are normal.     Palpations: Abdomen is soft.  Musculoskeletal:        General: Normal range of motion.     Cervical back: Normal range of motion.  Skin:    General: Skin is warm and dry.  Neurological:     General: No focal deficit present.     Mental Status: She is alert and oriented to person, place, and time. Mental status is at baseline.  Psychiatric:        Mood and Affect: Mood normal.        Behavior: Behavior normal.      UC Treatments / Results  Labs (all labs ordered are listed, but only abnormal results are displayed) Labs Reviewed  POCT URINALYSIS DIP (MANUAL ENTRY) - Abnormal; Notable for the following components:      Result Value   Color, UA straw (*)    Urobilinogen, UA 4.0 (*)    All other components within normal limits  URINE CULTURE  COMPREHENSIVE METABOLIC PANEL  CBC    EKG   Radiology DG Chest 2 View  Result Date: 05/04/2022 CLINICAL DATA:  Fever and cough EXAM:  CHEST - 2 VIEW COMPARISON:  04/30/2022 FINDINGS: The heart size and mediastinal contours are within normal limits. Both lungs are clear. The visualized skeletal structures are unremarkable. IMPRESSION: No active cardiopulmonary disease. Electronically Signed   By: Jasmine Pang M.D.   On: 05/04/2022 17:28    Procedures Procedures (including critical care time)  Medications Ordered in UC Medications  acetaminophen (TYLENOL) tablet 1,000 mg (1,000 mg Oral Given 05/04/22 1734)    Initial Impression / Assessment and Plan / UC Course  I have reviewed the triage vital signs and the nursing notes.  Pertinent labs & imaging results that were available during my care of the patient were reviewed by me and considered in my medical decision making (see chart for details).     Chest x-ray was negative for any acute cardiopulmonary process.  Suspect that patient could have acute bronchitis given persistent and harsh cough.  Urine test was repeated given suspicion of urinary tract infection on ED visit.  It was negative for any urinary tract infection.  Will send for urine culture to confirm.  Due to negative urine sample and patient's persistent upper respiratory symptoms, patient to stop cefadroxil and start Augmentin.  Will also prescribe prednisone steroid taper to help alleviate persistent and harsh cough and inflammation in chest.  Tylenol administered in urgent care for 103 fever with improvement to 99.6.  CMP and CBC also pending.  Discussed supportive care with patient.  Fever monitoring and management discussed with patient as well.  Suspect tachycardia was related to fever. Patient advised to go to the hospital if symptoms persist or worsen.  Patient also encouraged to follow-up with PCP as well.  Patient verbalized understanding and was agreeable with plan. Final Clinical Impressions(s) / UC Diagnoses   Final diagnoses:  Fever, unspecified  Persistent cough     Discharge Instructions       Your x-ray was normal.  Urine did not show signs of urinary tract infection.  Please stop antibiotic prescribed by ER and start Augmentin today.  You have also prescribed prednisone steroid to help alleviate inflammation and persistent cough.  Please continue to monitor fevers and treat as appropriate with Tylenol or ibuprofen.  Blood work is pending.  We will call if it is abnormal.  Please go to the hospital if symptoms persist or worsen.  Also recommend that you follow-up with a primary care doctor.     ED Prescriptions     Medication Sig Dispense Auth. Provider   amoxicillin-clavulanate (AUGMENTIN) 875-125 MG tablet Take 1 tablet by mouth every 12 (twelve) hours. 14 tablet [place], [place] E, [place]   predniSONE (STERAPRED UNI-PAK 21 TAB) 10 MG (21) TBPK tablet Take by mouth daily. Take 6 tabs by mouth daily  for 2 days, then 5 tabs for 2 days, then 4 tabs for 2 days, then 3 tabs for 2 days, 2 tabs for 2 days, then 1 tab by mouth daily for 2 days 42 tablet [place], Acie Fredrickson, [place]      PDMP not reviewed this encounter.   Gustavus Bryant, [place] 05/04/22 1812    Gustavus Bryant, [place] 05/04/22 [phone]

## 2022-05-04 NOTE — Discharge Instructions (Signed)
Your x-ray was normal.  Urine did not show signs of urinary tract infection.  Please stop antibiotic prescribed by ER and start Augmentin today.  You have also prescribed prednisone steroid to help alleviate inflammation and persistent cough.  Please continue to monitor fevers and treat as appropriate with Tylenol or ibuprofen.  Blood work is pending.  We will call if it is abnormal.  Please go to the hospital if symptoms persist or worsen.  Also recommend that you follow-up with a primary care doctor.

## 2022-05-04 NOTE — ED Triage Notes (Signed)
Pt present fever and chest congestion, symptoms started a week ago. Pt states she coughing so much that her head is hurting.

## 2022-05-06 LAB — COMPREHENSIVE METABOLIC PANEL
ALT: 299 IU/L — ABNORMAL HIGH (ref 0–32)
AST: 279 IU/L — ABNORMAL HIGH (ref 0–40)
Albumin/Globulin Ratio: 1.1 — ABNORMAL LOW (ref 1.2–2.2)
Albumin: 3.6 g/dL — ABNORMAL LOW (ref 3.9–5.0)
Alkaline Phosphatase: 104 IU/L (ref 44–121)
BUN/Creatinine Ratio: 7 — ABNORMAL LOW (ref 9–23)
BUN: 5 mg/dL — ABNORMAL LOW (ref 6–20)
Bilirubin Total: 0.4 mg/dL (ref 0.0–1.2)
CO2: 20 mmol/L (ref 20–29)
Calcium: 8.6 mg/dL — ABNORMAL LOW (ref 8.7–10.2)
Chloride: 100 mmol/L (ref 96–106)
Creatinine, Ser: 0.67 mg/dL (ref 0.57–1.00)
Globulin, Total: 3.4 g/dL (ref 1.5–4.5)
Glucose: 90 mg/dL (ref 70–99)
Potassium: 3.8 mmol/L (ref 3.5–5.2)
Sodium: 139 mmol/L (ref 134–144)
Total Protein: 7 g/dL (ref 6.0–8.5)
eGFR: 124 mL/min/{1.73_m2} (ref >59)

## 2022-05-06 LAB — CBC
Hematocrit: 36.1 % (ref 34.0–46.6)
Hemoglobin: 10.8 g/dL — ABNORMAL LOW (ref 11.1–15.9)
MCH: 22.9 pg — ABNORMAL LOW (ref 26.6–33.0)
MCHC: 29.9 g/dL — ABNORMAL LOW (ref 31.5–35.7)
MCV: 77 fL — ABNORMAL LOW (ref 79–97)
Platelets: 224 10*3/uL (ref 150–450)
RBC: 4.71 x10E6/uL (ref 3.77–5.28)
RDW: 16.3 % — ABNORMAL HIGH (ref 11.7–15.4)
WBC: 10.9 10*3/uL — ABNORMAL HIGH (ref 3.4–10.8)

## 2022-05-06 LAB — URINE CULTURE: Culture: NO GROWTH

## 2022-05-15 DIAGNOSIS — D72829 Elevated white blood cell count, unspecified: Secondary | ICD-10-CM | POA: Diagnosis not present

## 2022-05-15 DIAGNOSIS — R748 Abnormal levels of other serum enzymes: Secondary | ICD-10-CM | POA: Diagnosis not present

## 2022-05-28 DIAGNOSIS — R748 Abnormal levels of other serum enzymes: Secondary | ICD-10-CM | POA: Diagnosis not present

## 2022-05-28 DIAGNOSIS — R7309 Other abnormal glucose: Secondary | ICD-10-CM | POA: Diagnosis not present

## 2022-05-29 DIAGNOSIS — K8020 Calculus of gallbladder without cholecystitis without obstruction: Secondary | ICD-10-CM | POA: Diagnosis not present

## 2022-08-28 DIAGNOSIS — Z6841 Body Mass Index (BMI) 40.0 and over, adult: Secondary | ICD-10-CM | POA: Diagnosis not present

## 2022-08-28 DIAGNOSIS — Z760 Encounter for issue of repeat prescription: Secondary | ICD-10-CM | POA: Diagnosis not present

## 2022-08-28 DIAGNOSIS — Z0000 Encounter for general adult medical examination without abnormal findings: Secondary | ICD-10-CM | POA: Diagnosis not present

## 2022-08-29 ENCOUNTER — Ambulatory Visit
Admission: EM | Admit: 2022-08-29 | Discharge: 2022-08-29 | Disposition: A | Discharge status: Home / Self Care | Payer: BC Managed Care – PPO | Attending: Family Medicine | Admitting: Family Medicine

## 2022-08-29 DIAGNOSIS — J069 Acute upper respiratory infection, unspecified: Secondary | ICD-10-CM | POA: Diagnosis not present

## 2022-08-29 MED ORDER — PROMETHAZINE-DM 6.25-15 MG/5ML PO SYRP: 5.0000 mL | ORAL_SOLUTION | Freq: Four times a day (QID) | ORAL | 0 refills | Status: DC | PRN

## 2022-08-29 MED ORDER — PREDNISONE 20 MG PO TABS: 40.0000 mg | ORAL_TABLET | Freq: Every day | ORAL | 0 refills | Status: DC

## 2022-08-29 MED ORDER — AZITHROMYCIN 250 MG PO TABS: ORAL_TABLET | ORAL | 0 refills | Status: DC

## 2022-08-29 NOTE — Discharge Instructions (Addendum)
I have sent over prednisone, a cough syrup and an antibiotic in case you are not feeling better.  Try not to take the antibiotic and that she [place] have to, wait at least 4 to 5 days to see if you will improve on the prednisone and other medications first.  Keep taking your allergy medications daily including the nasal spray.

## 2022-08-29 NOTE — ED Provider Notes (Signed)
[facility]    CSN: [id number] Arrival date & time: 08/29/22  1204      History   Chief Complaint Chief Complaint  Patient presents with   Cough   Ear Fullness         HPI Jaime Ramos is a 26 y.o. female.   Patient presenting today with about a week of progressively worsening ear fullness on the left, cough, fatigue, congestion, sinus pressure, chest tightness.  States her sputum is now a brown-yellow color and very thick.  Denies chest pain, shortness of breath, abdominal pain, nausea vomiting or diarrhea.  Has been consistently taking her allergy medication, added a nasal spray yesterday with no relief and has been taking DayQuil and NyQuil with no relief.  No known history of chronic pulmonary disease.  No known sick contacts recently.    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 08/02/2021   Depression    doing good   Genital herpes    Obesity    Pre-diabetes    Precocious puberty    Trichomonal vaginitis    Wears glasses     Patient Active Problem List   Diagnosis Date Noted   Trichomoniasis 09/23/2018   Abnormal Pap smear of cervix 12/20/2016   MDD (major depressive disorder), recurrent episode, moderate ([place]) 01/25/2016   Chlamydia 04/27/2015   Nexplanon removal 02/09/2015   Preterm delivery 10/10/2013   Preterm labor 10/09/2013   Depression 10/02/2013   Vaginal bleeding before [redacted] weeks gestation 10/02/2013   Acanthosis 05/28/2012   Menorrhagia with regular cycle 07/31/2011   Goiter 07/31/2011   Prediabetes    Obesity 05/17/2011    Past Surgical History:  Procedure Laterality Date   DILATATION & CURETTAGE/HYSTEROSCOPY WITH MYOSURE N/A 08/10/2021   Procedure: [place];  Surgeon: Armandina Stammer, DO;  Location: [place];  Service: Gynecology;  Laterality: N/A;   DILATION AND CURETTAGE OF UTERUS N/A 11/09/2014   Procedure: DILATATION AND CURETTAGE;  Surgeon: Emily Filbert, MD;  Location: [facility];  Service: Gynecology;  Laterality: N/A;    OB History     Gravida  3   Para  1   Term      Preterm  1   AB  2   Living  0      SAB  1   IAB      Ectopic  1   Multiple      Live Births  1            Home Medications    Prior to Admission medications   Medication Sig Start Date End Date Taking? Authorizing Provider  azithromycin (ZITHROMAX) 250 MG tablet Do not take unless worsening over the next 4-5 days. Take first 2 tablets together, then 1 every day until finished. 08/29/22  Yes Volney American, PA-C  predniSONE (DELTASONE) 20 MG tablet Take 2 tablets (40 mg total) by mouth daily with breakfast. 08/29/22  Yes Volney American, PA-C  promethazine-dextromethorphan (PROMETHAZINE-DM) 6.25-15 MG/5ML syrup Take 5 mLs by mouth 4 (four) times daily as needed. 08/29/22  Yes Volney American, PA-C  albuterol (VENTOLIN HFA) 108 (90 Base) MCG/ACT inhaler Inhale 2 puffs into the lungs every 6 (six) hours  as needed for wheezing or shortness of breath (Cough). 02/13/22   Theadora Rama Scales, PA-C  amoxicillin-clavulanate (AUGMENTIN) 875-125 MG tablet Take 1 tablet by mouth every 12 (twelve) hours. 05/04/22   Gustavus Bryant, FNP  benzonatate (TESSALON) 100 MG capsule Take 1 capsule (100 mg total) by mouth 3 (three) times daily as needed for cough. 04/27/22   Zenia Resides, MD  ipratropium (ATROVENT) 0.06 % nasal spray Place 2 sprays into both nostrils 4 (four) times daily. As needed for nasal congestion, runny nose 02/13/22   Theadora Rama Scales, PA-C  levocetirizine (XYZAL) 5 MG tablet Take 1 tablet (5 mg total) by mouth every evening. 02/13/22 05/14/22  Theadora Rama Scales, PA-C  montelukast (SINGULAIR) 10 MG tablet Take 1 tablet (10 mg total) by mouth at bedtime. 02/13/22  05/14/22  Theadora Rama Scales, PA-C  ondansetron (ZOFRAN-ODT) 4 MG disintegrating tablet Take 1 tablet (4 mg total) by mouth every 8 (eight) hours as needed for nausea or vomiting. 04/27/22   Zenia Resides, MD  predniSONE (STERAPRED UNI-PAK 21 TAB) 10 MG (21) TBPK tablet Take by mouth daily. Take 6 tabs by mouth daily  for 2 days, then 5 tabs for 2 days, then 4 tabs for 2 days, then 3 tabs for 2 days, 2 tabs for 2 days, then 1 tab by mouth daily for 2 days 05/04/22   Gustavus Bryant, FNP  fluticasone [facility]) 50 MCG/ACT nasal spray Place 1 spray into both nostrils daily for 14 days. 03/07/20 03/24/20  Durward Parcel, FNP    Family History Family History  Problem Relation Age of Onset   Diabetes Mother    Hypertension Mother    Obesity Mother    Fibroids Mother    Cancer Mother        thyroid   Obesity Father    Obesity Maternal Aunt    Fibroids Maternal Aunt    Obesity Maternal Uncle    Obesity Paternal Aunt    Diabetes Maternal Grandmother    Obesity Maternal Grandmother    Hypertension Maternal Grandmother    Diabetes Maternal Grandfather    Obesity Maternal Grandfather    Stroke Maternal Grandfather    Heart disease Maternal Grandfather    Hypertension Maternal Grandfather    Obesity Paternal Grandfather    Heart disease Paternal Grandfather    Bipolar disorder Brother    Bipolar disorder Brother     Social History Social History   Tobacco Use   Smoking status: Never   Smokeless tobacco: Never  Vaping Use   Vaping Use: Never used  Substance Use Topics   Alcohol use: No   Drug use: Never     Allergies   Patient has no known allergies.   Review of Systems Review of Systems PER HPI  Physical Exam Triage Vital Signs ED Triage Vitals  Enc Vitals Group     BP 08/29/22 1238 110/71     Pulse Rate 08/29/22 1238 (!) 105     Resp 08/29/22 1238 20     Temp 08/29/22 1238 99.5 F (37.5 C)     Temp Source 08/29/22 1238 Oral     SpO2 08/29/22 1238 97 %      Weight --      Height --      Head Circumference --      Peak Flow --      Pain Score 08/29/22 1237 0     Pain Loc --      Pain  Edu? --      Excl. in GC? --    No data found.  Updated Vital Signs BP 110/71 (BP Location: Right Arm)   Pulse (!) 105   Temp 99.5 F (37.5 C) (Oral)   Resp 20   LMP 07/30/2022 (Exact Date)   SpO2 97%   Visual Acuity Right Eye Distance:   Left Eye Distance:   Bilateral Distance:    Right Eye Near:   Left Eye Near:    Bilateral Near:     Physical Exam Vitals and nursing note reviewed.  Constitutional:      Appearance: Normal appearance.  HENT:     Head: Atraumatic.     Right Ear: Tympanic membrane and external ear normal.     Left Ear: External ear normal.     Ears:     Comments: Left middle ear effusion    Nose: Congestion present.     Mouth/Throat:     Mouth: Mucous membranes are moist.     Pharynx: Posterior oropharyngeal erythema present.  Eyes:     Extraocular Movements: Extraocular movements intact.     Conjunctiva/sclera: Conjunctivae normal.  Cardiovascular:     Rate and Rhythm: Normal rate and regular rhythm.     Heart sounds: Normal heart sounds.  Pulmonary:     Effort: Pulmonary effort is normal.     Breath sounds: Normal breath sounds. No wheezing or rales.  Musculoskeletal:        General: Normal range of motion.     Cervical back: Normal range of motion and neck supple.  Skin:    General: Skin is warm and dry.  Neurological:     Mental Status: She is alert and oriented to person, place, and time.  Psychiatric:        Mood and Affect: Mood normal.        Thought Content: Thought content normal.      UC Treatments / Results  Labs (all labs ordered are listed, but only abnormal results are displayed) Labs Reviewed - No data to display  EKG   Radiology No results found.  Procedures Procedures (including critical care time)  Medications Ordered in UC Medications - No data to display  Initial  Impression / Assessment and Plan / UC Course  I have reviewed the triage vital signs and the nursing notes.  Pertinent labs & imaging results that were available during my care of the patient were reviewed by me and considered in my medical decision making (see chart for details).     Unclear if viral versus allergic initially, now progressing by the improving after a week.  We will add prednisone, Phenergan DM to allergy regimen and supportive over-the-counter medications and home care.  If worsening or not improving over the next 4 to 5 days add azithromycin.  Return for worsening symptoms.  Final Clinical Impressions(s) / UC Diagnoses   Final diagnoses:  Upper respiratory tract infection, unspecified type     Discharge Instructions      I have sent over prednisone, a cough syrup and an antibiotic in case you are not feeling better.  Try not to take the antibiotic and that she [place] have to, wait at least 4 to 5 days to see if you will improve on the prednisone and other medications first.  Keep taking your allergy medications daily including the nasal spray.    ED Prescriptions     Medication Sig Dispense Auth. Provider   predniSONE (DELTASONE) 20 MG  tablet Take 2 tablets (40 mg total) by mouth daily with breakfast. 10 tablet Particia Nearing, PA-C   azithromycin (ZITHROMAX) 250 MG tablet Do not take unless worsening over the next 4-5 days. Take first 2 tablets together, then 1 every day until finished. 6 tablet Particia Nearing, [place]   promethazine-dextromethorphan (PROMETHAZINE-DM) 6.25-15 MG/5ML syrup Take 5 mLs by mouth 4 (four) times daily as needed. 100 mL Particia Nearing, [place]      PDMP not reviewed this encounter.   Particia Nearing, [place] 08/29/22 1323

## 2022-08-29 NOTE — ED Triage Notes (Signed)
Pt reports, ear fullness, cough, fatigue and nasal congestion x 1 day. Pt reports she had her physical done yesterday and ears were fine. Nasal spray gives no relief.

## 2022-09-29 ENCOUNTER — Ambulatory Visit
Admission: EM | Admit: 2022-09-29 | Discharge: 2022-09-29 | Disposition: A | Discharge status: Home / Self Care | Payer: BC Managed Care – PPO | Attending: Urgent Care | Admitting: Urgent Care

## 2022-09-29 DIAGNOSIS — Z1152 Encounter for screening for COVID-19: Secondary | ICD-10-CM | POA: Insufficient documentation

## 2022-09-29 DIAGNOSIS — Z79899 Other long term (current) drug therapy: Secondary | ICD-10-CM | POA: Diagnosis not present

## 2022-09-29 DIAGNOSIS — B349 Viral infection, unspecified: Secondary | ICD-10-CM | POA: Diagnosis not present

## 2022-09-29 DIAGNOSIS — J309 Allergic rhinitis, unspecified: Secondary | ICD-10-CM | POA: Insufficient documentation

## 2022-09-29 DIAGNOSIS — R519 Headache, unspecified: Secondary | ICD-10-CM | POA: Insufficient documentation

## 2022-09-29 DIAGNOSIS — H6993 Unspecified Eustachian tube disorder, bilateral: Secondary | ICD-10-CM | POA: Diagnosis not present

## 2022-09-29 DIAGNOSIS — R52 Pain, unspecified: Secondary | ICD-10-CM | POA: Diagnosis not present

## 2022-09-29 DIAGNOSIS — J4520 Mild intermittent asthma, uncomplicated: Secondary | ICD-10-CM | POA: Insufficient documentation

## 2022-09-29 MED ORDER — PROMETHAZINE-DM 6.25-15 MG/5ML PO SYRP: 5.0000 mL | ORAL_SOLUTION | Freq: Three times a day (TID) | ORAL | 0 refills | Status: DC | PRN

## 2022-09-29 MED ORDER — PREDNISONE 50 MG PO TABS: 50.0000 mg | ORAL_TABLET | Freq: Every day | ORAL | 0 refills | Status: DC

## 2022-09-29 MED ORDER — KETOROLAC TROMETHAMINE 30 MG/ML IJ SOLN
60.0000 mg | Freq: Once | INTRAMUSCULAR | Status: AC
  Administered 2022-09-29: 60 mg via INTRAMUSCULAR

## 2022-09-29 MED ORDER — ALBUTEROL SULFATE HFA 108 (90 BASE) MCG/ACT IN AERS: 2.0000 | INHALATION_SPRAY | Freq: Four times a day (QID) | RESPIRATORY_TRACT | 0 refills | Status: AC | PRN

## 2022-09-29 NOTE — ED Provider Notes (Signed)
[facility]  Note:  This document was prepared using Systems analyst and may include unintentional dictation errors.  MRN: [id number] DOB: 05-03-1996  Subjective:   Jaime Ramos is a 26 y.o. female presenting for 1 day history of acute onset persistent severe body pains, body aches, malaise and fatigue.  Has also had recurrent bilateral ear fullness, ear popping, sinus drainage, sinus headache, frontal headache.  No neck pain or stiffness.  She has had ongoing issues with her asthma and allergic rhinitis.  Takes Xyzal regularly, Singulair daily.  Also uses albuterol inhaler as needed.  Has been using it more frequently this past week.  Has not seen an ENT specialist.  Patient also reports heavy menstrual periods and vaginal bleeding.  States that she does not want to address this at our clinic, prefers to follow-up with her PCP.   Current Facility-Administered Medications:    ketorolac (TORADOL) 30 MG/ML injection 60 mg, 60 mg, Intramuscular, Once, Jaynee Eagles, PA-C  Current Outpatient Medications:    albuterol (VENTOLIN HFA) 108 (90 Base) MCG/ACT inhaler, Inhale 2 puffs into the lungs every 6 (six) hours as needed for wheezing or shortness of breath (Cough)., Disp: 18 g, Rfl: 0   levocetirizine (XYZAL) 5 MG tablet, Take 1 tablet (5 mg total) by mouth every evening., Disp: 30 tablet, Rfl: 2   montelukast (SINGULAIR) 10 MG tablet, Take 1 tablet (10 mg total) by mouth at bedtime., Disp: 30 tablet, Rfl: 2   No Known Allergies  Past Medical History:  Diagnosis Date   Abnormal Pap smear of cervix 12/20/2016   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 08/02/2021   Depression    doing good   Genital herpes    Obesity    Pre-diabetes    Precocious puberty    Trichomonal vaginitis    Wears glasses      Past Surgical History:  Procedure Laterality Date   DILATATION  & CURETTAGE/HYSTEROSCOPY WITH MYOSURE N/A 08/10/2021   Procedure: [place];  Surgeon: Armandina Stammer, DO;  Location: [place];  Service: Gynecology;  Laterality: N/A;   DILATION AND CURETTAGE OF UTERUS N/A 11/09/2014   Procedure: DILATATION AND CURETTAGE;  Surgeon: Emily Filbert, MD;  Location: [facility];  Service: Gynecology;  Laterality: N/A;    Family History  Problem Relation Age of Onset   Diabetes Mother    Hypertension Mother    Obesity Mother    Fibroids Mother    Cancer Mother        thyroid   Obesity Father    Obesity Maternal Aunt    Fibroids Maternal Aunt    Obesity Maternal Uncle    Obesity Paternal Aunt    Diabetes Maternal Grandmother    Obesity Maternal Grandmother    Hypertension Maternal Grandmother    Diabetes Maternal Grandfather    Obesity Maternal Grandfather    Stroke Maternal Grandfather    Heart disease Maternal Grandfather    Hypertension Maternal Grandfather    Obesity Paternal Grandfather    Heart disease Paternal Grandfather    Bipolar disorder Brother    Bipolar disorder Brother     Social History   Tobacco Use   Smoking status: Never   Smokeless tobacco: Never  Vaping Use   Vaping Use: Never used  Substance Use Topics   Alcohol use: No   Drug use: Never    ROS   Objective:   Vitals: BP 111/75 (BP Location: Left Arm)   Pulse 87   Temp 98.9 F (37.2 C) (Oral)   Resp 20   LMP 09/28/2022   SpO2 98%   Physical Exam Constitutional:      General: She is not in acute distress.    Appearance: Normal appearance. She is well-developed and normal weight. She is not ill-appearing, toxic-appearing or diaphoretic.  HENT:     Head: Normocephalic and atraumatic.     Right Ear: Tympanic membrane, ear canal and external ear normal. No drainage or tenderness. No middle ear effusion. There is no impacted cerumen. Tympanic membrane is not erythematous or bulging.     Left Ear: Tympanic  membrane, ear canal and external ear normal. No drainage or tenderness.  No middle ear effusion. There is no impacted cerumen. Tympanic membrane is not erythematous or bulging.     Nose: Nose normal. No congestion or rhinorrhea.     Mouth/Throat:     Mouth: Mucous membranes are moist. No oral lesions.     Pharynx: No pharyngeal swelling, oropharyngeal exudate, posterior oropharyngeal erythema or uvula swelling.     Tonsils: No tonsillar exudate or tonsillar abscesses.  Eyes:     General: No scleral icterus.       Right eye: No discharge.        Left eye: No discharge.     Extraocular Movements: Extraocular movements intact.     Right eye: Normal extraocular motion.     Left eye: Normal extraocular motion.     Conjunctiva/sclera: Conjunctivae normal.     Pupils: Pupils are equal, round, and reactive to light.  Neck:     Meningeal: Brudzinski's sign and Kernig's sign absent.  Cardiovascular:     Rate and Rhythm: Normal rate and regular rhythm.     Heart sounds: Normal heart sounds. No murmur heard.    No friction rub. No gallop.  Pulmonary:     Effort: Pulmonary effort is normal. No respiratory distress.     Breath sounds: No stridor. No wheezing, rhonchi or rales.  Chest:     Chest wall: No tenderness.  Musculoskeletal:     Cervical back: Normal range of motion and neck supple.  Lymphadenopathy:     Cervical: No cervical adenopathy.  Skin:    General: Skin is warm and dry.  Neurological:     General: No focal deficit present.     Mental Status: She is alert and oriented to person, place, and time.     Cranial Nerves: No cranial nerve deficit, dysarthria or facial asymmetry.     Motor: No weakness or pronator drift.     Coordination: Romberg sign negative. Coordination normal. Finger-Nose-Finger Test and Heel to [facility] Test normal. Rapid alternating movements normal.     Gait: Gait and tandem walk normal.     Deep Tendon Reflexes: Reflexes normal.  Psychiatric:        Mood and  Affect: Mood normal.        Behavior: Behavior normal.        Thought Content: Thought content normal.        Judgment: Judgment normal.    IM Toradol in clinic and 60 mg.  Assessment and Plan :   PDMP not reviewed this encounter.  1. Acute viral syndrome   2. Body aches   3. Allergic rhinitis, unspecified seasonality,  unspecified trigger   4. Mild intermittent asthma without complication   5. Eustachian tube dysfunction, bilateral   6. Sinus headache     Patient would be a good candidate to start Tamiflu if her flu test is positive tomorrow.  Above measures were taken for her headache.  No signs of an acute encephalopathy. COVID and flu test pending.  We will otherwise manage for viral upper respiratory infection.  Physical exam findings reassuring and vital signs stable for discharge. Advised supportive care, offered symptomatic relief. Counseled patient on potential for adverse effects with medications prescribed/recommended today, ER and return-to-clinic precautions discussed, patient verbalized understanding.    Deferred imaging given clear cardiopulmonary exam, hemodynamically stable vital signs.   Patient plans on following up with her gynecologist and did not want [place] to evaluate for her vaginal bleeding, cramping.   Wallis Bamberg, [place] 09/29/22 [phone]

## 2022-09-29 NOTE — Discharge Instructions (Signed)
We will notify you of your test results as they arrive and may take between about 24 hours.  I encourage you to sign up for MyChart if you have not already done so as this can be the easiest way for [place] to communicate results to you online or through a phone app.  Generally, we only contact you if it is a positive test result.  In the meantime, if you develop worsening symptoms including fever, chest pain, shortness of breath despite our current treatment plan then please report to the emergency room as this may be a sign of worsening status from possible viral infection.  Otherwise, we will manage this as a viral syndrome. For sore throat or cough try using a honey-based tea. Use 3 teaspoons of honey with juice squeezed from half lemon. Place shaved pieces of ginger into 1/2-1 cup of water and warm over stove top. Then mix the ingredients and repeat every 4 hours as needed. Please take Tylenol 500mg -650mg  every 6 hours for aches and pains, fevers. Hydrate very well with at least 2 liters of water. Eat light meals such as soups to replenish electrolytes and soft fruits, veggies. Start an antihistamine like Zyrtec for postnasal drainage, sinus congestion.  You can take this together with prednisone for your allergic rhinitis and asthma.  Use the cough medications as needed.   If your COVID or flu test is positive we can prescribe medications for these illnesses. Reach out to tomorrow after you see the results on mychart.

## 2022-09-29 NOTE — ED Triage Notes (Signed)
Pr c/o HA and heavy menstrual period- both started yesterday-NAD-steady gait

## 2022-09-30 LAB — RESP PANEL BY RT-PCR (FLU A&B, COVID) ARPGX2
Influenza A by PCR: NEGATIVE
Influenza B by PCR: NEGATIVE
SARS Coronavirus 2 by RT PCR: NEGATIVE

## 2022-11-01 ENCOUNTER — Encounter: Payer: Self-pay | Admitting: Emergency Medicine

## 2022-11-01 ENCOUNTER — Ambulatory Visit
Admission: EM | Admit: 2022-11-01 | Discharge: 2022-11-01 | Disposition: A | Discharge status: Home / Self Care | Payer: BC Managed Care – PPO | Attending: Nurse Practitioner | Admitting: Nurse Practitioner

## 2022-11-01 DIAGNOSIS — Z1152 Encounter for screening for COVID-19: Secondary | ICD-10-CM | POA: Insufficient documentation

## 2022-11-01 DIAGNOSIS — J069 Acute upper respiratory infection, unspecified: Secondary | ICD-10-CM | POA: Insufficient documentation

## 2022-11-01 MED ORDER — PSEUDOEPH-BROMPHEN-DM 30-2-10 MG/5ML PO SYRP: 5.0000 mL | ORAL_SOLUTION | Freq: Four times a day (QID) | ORAL | 0 refills | Status: DC | PRN

## 2022-11-01 MED ORDER — PREDNISONE 50 MG PO TABS: ORAL_TABLET | ORAL | 0 refills | Status: DC

## 2022-11-01 MED ORDER — FLUTICASONE PROPIONATE 50 MCG/ACT NA SUSP: 2.0000 | Freq: Every day | NASAL | 0 refills | Status: DC

## 2022-11-01 NOTE — Discharge Instructions (Addendum)
COVID test is pending.  As discussed, you are candidate to receive Paxlovid if your COVID test is positive.  You will be contacted if the pending test is positive. Take medication as prescribed.  Continue your current allergy medications. Increase fluids and allow for plenty of rest.  Try to drink at least 8-10 8 ounce glasses of water while symptoms persist. Normal saline nasal spray to help with nasal congestion. Warm salt water gargles 3-4 times daily for throat pain or discomfort. Recommend sleeping elevated on pillows and using a humidifier in your bedroom at nighttime during sleep while cough symptoms persist. Recommend a brat diet until nausea improves.  This includes bananas, rice, applesauce, and toast. As discussed, if symptoms do not improve over the next 2 to 5 days, please follow-up for further evaluation. Follow-up as needed.

## 2022-11-01 NOTE — ED Triage Notes (Signed)
Cough, bilateral ear fullness, night sweats, nasal congestion, headaches since last weekend.

## 2022-11-01 NOTE — ED Provider Notes (Signed)
[facility]    CSN: [id number] Arrival date & time: 11/01/22  1419      History   Chief Complaint Chief Complaint  Patient presents with   Cough   Nasal Congestion   Headache    HPI Jaime Ramos is a 26 y.o. female.   The history is provided by the patient.   Patient presents with a 3-day history of cough, bilateral ear fullness, night sweats, nasal congestion, and headaches that started over the last 3 days.  Patient reports that she feels like she has had fever at nighttime.  She also endorses wheezing.  She states cough has also been productive.  Patient denies sore throat, abdominal pain, or diarrhea.  She reports that she has vomited twice since her symptoms started.  She has been taking over-the-counter cough and cold medication for her symptoms.  Patient reports history of asthma.  She states that she has been using her albuterol inhaler for her symptoms. 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 08/02/2021   Depression    doing good   Genital herpes    Obesity    Pre-diabetes    Precocious puberty    Trichomonal vaginitis    Wears glasses     Patient Active Problem List   Diagnosis Date Noted   Trichomoniasis 09/23/2018   Abnormal Pap smear of cervix 12/20/2016   MDD (major depressive disorder), recurrent episode, moderate (HCC) 01/25/2016   Chlamydia 04/27/2015   Nexplanon removal 02/09/2015   Preterm delivery 10/10/2013   Preterm labor 10/09/2013   Depression 10/02/2013   Vaginal bleeding before [redacted] weeks gestation 10/02/2013   Acanthosis 05/28/2012   Menorrhagia with regular cycle 07/31/2011   Goiter 07/31/2011   Prediabetes    Obesity 05/17/2011    Past Surgical History:  Procedure Laterality Date   DILATATION & CURETTAGE/HYSTEROSCOPY WITH MYOSURE N/A 08/10/2021    Procedure: DILATATION & CURETTAGE/HYSTEROSCOPY WITH MYOSURE;  Surgeon: Toy Baker, DO;  Location: [facility];  Service: Gynecology;  Laterality: N/A;   DILATION AND CURETTAGE OF UTERUS N/A 11/09/2014   Procedure: DILATATION AND CURETTAGE;  Surgeon: Allie Bossier, MD;  Location: [facility];  Service: Gynecology;  Laterality: N/A;    OB History     Gravida  3   Para  1   Term      Preterm  1   AB  2   Living  0      SAB  1   IAB      Ectopic  1   Multiple      Live Births  1            Home Medications    Prior to Admission medications   Medication Sig Start Date End Date Taking? Authorizing Provider  brompheniramine-pseudoephedrine-DM 30-2-10 MG/5ML syrup Take 5 mLs by mouth 4 (four) times daily as needed. 11/01/22  Yes Othello Dickenson-Warren, Sadie Haber, NP  fluticasone (FLONASE) 50 MCG/ACT nasal spray Place 2 sprays into both nostrils daily. 11/01/22  Yes Daira Hine-Warren, Sadie Haber, NP  predniSONE (DELTASONE) 50 MG tablet Daily with breakfast for 5 days. 11/01/22  Yes Oakes Mccready-Warren, Sadie Haber, NP  albuterol (VENTOLIN HFA) 108 (90 Base) MCG/ACT inhaler Inhale 2 puffs into the lungs every 6 (  six) hours as needed for wheezing or shortness of breath (Cough). 09/29/22   Wallis BambergMani, Mario, PA-C  levocetirizine (XYZAL) 5 MG tablet Take 1 tablet (5 mg total) by mouth every evening. 02/13/22 05/14/22  Theadora RamaMorgan, Lindsay Scales, PA-C  montelukast (SINGULAIR) 10 MG tablet Take 1 tablet (10 mg total) by mouth at bedtime. 02/13/22 05/14/22  Theadora RamaMorgan, Lindsay Scales, PA-C  promethazine-dextromethorphan (PROMETHAZINE-DM) 6.25-15 MG/5ML syrup Take 5 mLs by mouth 3 (three) times daily as needed for cough. 09/29/22   Wallis BambergMani, Mario, PA-C    Family History Family History  Problem Relation Age of Onset   Diabetes Mother    Hypertension Mother    Obesity Mother    Fibroids Mother    Cancer Mother        thyroid   Obesity Father    Obesity Maternal Aunt    Fibroids Maternal Aunt    Obesity  Maternal Uncle    Obesity Paternal Aunt    Diabetes Maternal Grandmother    Obesity Maternal Grandmother    Hypertension Maternal Grandmother    Diabetes Maternal Grandfather    Obesity Maternal Grandfather    Stroke Maternal Grandfather    Heart disease Maternal Grandfather    Hypertension Maternal Grandfather    Obesity Paternal Grandfather    Heart disease Paternal Grandfather    Bipolar disorder Brother    Bipolar disorder Brother     Social History Social History   Tobacco Use   Smoking status: Never   Smokeless tobacco: Never  Vaping Use   Vaping Use: Never used  Substance Use Topics   Alcohol use: No   Drug use: Never     Allergies   Patient has no known allergies.   Review of Systems Review of Systems Per HPI  Physical Exam Triage Vital Signs ED Triage Vitals  Enc Vitals Group     BP 11/01/22 1525 132/83     Pulse Rate 11/01/22 1525 (!) 112     Resp 11/01/22 1525 20     Temp 11/01/22 1525 98.5 F (36.9 C)     Temp Source 11/01/22 1525 Oral     SpO2 11/01/22 1525 98 %     Weight --      Height --      Head Circumference --      Peak Flow --      Pain Score 11/01/22 1526 9     Pain Loc --      Pain Edu? --      Excl. in [place]? --    No data found.  Updated Vital Signs BP 132/83 (BP Location: Right Arm)   Pulse (!) 112   Temp 98.5 F (36.9 C) (Oral)   Resp 20   LMP 10/29/2022 (Exact Date)   SpO2 98%   Visual Acuity Right Eye Distance:   Left Eye Distance:   Bilateral Distance:    Right Eye Near:   Left Eye Near:    Bilateral Near:     Physical Exam Vitals and nursing note reviewed.  Constitutional:      General: She is not in acute distress.    Appearance: She is well-developed.  HENT:     Head: Normocephalic.     Right Ear: Tympanic membrane, ear canal and external ear normal.     Left Ear: Tympanic membrane, ear canal and external ear normal.     Nose: Congestion present. No rhinorrhea.     Mouth/Throat:     Mouth: Mucous  membranes are  moist.  Eyes:     Extraocular Movements: Extraocular movements intact.     Conjunctiva/sclera: Conjunctivae normal.     Pupils: Pupils are equal, round, and reactive to light.  Cardiovascular:     Rate and Rhythm: Regular rhythm.     Heart sounds: Normal heart sounds.  Pulmonary:     Effort: Pulmonary effort is normal. No respiratory distress.     Breath sounds: Normal breath sounds. No stridor. No wheezing, rhonchi or rales.  Abdominal:     General: Bowel sounds are normal.     Palpations: Abdomen is soft.     Tenderness: There is no abdominal tenderness.  Musculoskeletal:     Cervical back: Normal range of motion.  Lymphadenopathy:     Cervical: No cervical adenopathy.  Skin:    General: Skin is warm and dry.  Neurological:     General: No focal deficit present.     Mental Status: She is alert and oriented to person, place, and time.     GCS: GCS eye subscore is 4. GCS verbal subscore is 5. GCS motor subscore is 6.  Psychiatric:        Mood and Affect: Mood normal.        Speech: Speech normal.        Behavior: Behavior normal.      UC Treatments / Results  Labs (all labs ordered are listed, but only abnormal results are displayed) Labs Reviewed  SARS CORONAVIRUS 2 (TAT 6-24 HRS)    EKG   Radiology No results found.  Procedures Procedures (including critical care time)  Medications Ordered in UC Medications - No data to display  Initial Impression / Assessment and Plan / UC Course  I have reviewed the triage vital signs and the nursing notes.  Pertinent labs & imaging results that were available during my care of the patient were reviewed by me and considered in my medical decision making (see chart for details).  The patient is well-appearing, she is in no acute distress, vital signs are stable, although she is mildly tachycardic.  COVID test is pending.  Patient is a candidate to receive Paxlovid if her COVID test is positive.  Will treat  patient symptomatically with Bromfed-DM to help with her nasal congestion and drainage, along with prednisone 50 mg, and fluticasone 50 mcg nasal spray.  Care recommendations were provided to the patient to include increasing fluids, allowing for plenty of rest, use of normal saline nasal spray to help with nasal congestion, and sleeping elevated on pillows with use of a humidifier.  Patient was advised to utilize symptomatic treatment for the next 3 to 5 days, asked her to follow-up if symptoms fail to improve or sooner if they worsen.  Patient verbalizes understanding.  All questions were answered.  Patient is stable for discharge.  Work note was provided. Final Clinical Impressions(s) / UC Diagnoses   Final diagnoses:  Encounter for screening for COVID-19  Viral upper respiratory tract infection with cough     Discharge Instructions      COVID test is pending.  As discussed, you are candidate to receive Paxlovid if your COVID test is positive.  You will be contacted if the pending test is positive. Take medication as prescribed.  Continue your current allergy medications. Increase fluids and allow for plenty of rest.  Try to drink at least 8-10 8 ounce glasses of water while symptoms persist. Normal saline nasal spray to help with nasal congestion. Warm salt water gargles  3-4 times daily for throat pain or discomfort. Recommend sleeping elevated on pillows and using a humidifier in your bedroom at nighttime during sleep while cough symptoms persist. Recommend a brat diet until nausea improves.  This includes bananas, rice, applesauce, and toast. As discussed, if symptoms do not improve over the next 2 to 5 days, please follow-up for further evaluation. Follow-up as needed.      ED Prescriptions     Medication Sig Dispense Auth. Provider   fluticasone (FLONASE) 50 MCG/ACT nasal spray Place 2 sprays into both nostrils daily. 16 g Kecia Swoboda-Warren, Sadie Haber, NP    brompheniramine-pseudoephedrine-DM 30-2-10 MG/5ML syrup Take 5 mLs by mouth 4 (four) times daily as needed. 140 mL Nyana Haren-Warren, Sadie Haber, NP   predniSONE (DELTASONE) 50 MG tablet Daily with breakfast for 5 days. 5 tablet Shneur [place]-Warren, Sadie Haber, NP      PDMP not reviewed this encounter.   Abran Cantor, NP 11/01/22 1616

## 2022-11-02 ENCOUNTER — Telehealth: Payer: Self-pay | Admitting: Family Medicine

## 2022-11-02 LAB — SARS CORONAVIRUS 2 (TAT 6-24 HRS): SARS Coronavirus 2: NEGATIVE

## 2022-11-02 MED ORDER — PROMETHAZINE-DM 6.25-15 MG/5ML PO SYRP: 5.0000 mL | ORAL_SOLUTION | Freq: Four times a day (QID) | ORAL | 0 refills | Status: DC | PRN

## 2022-11-02 NOTE — Telephone Encounter (Signed)
Cough syrup switch to Phenergan DM as pharmacy is out of the [employer]

## 2022-11-03 DIAGNOSIS — R0600 Dyspnea, unspecified: Secondary | ICD-10-CM | POA: Diagnosis not present

## 2022-11-03 DIAGNOSIS — R509 Fever, unspecified: Secondary | ICD-10-CM | POA: Diagnosis not present

## 2022-11-15 ENCOUNTER — Ambulatory Visit
Admission: RE | Admit: 2022-11-15 | Discharge: 2022-11-15 | Disposition: A | Discharge status: Home / Self Care | Payer: BC Managed Care – PPO | Source: Ambulatory Visit | Attending: Physician Assistant | Admitting: Physician Assistant

## 2022-11-15 VITALS — BP 123/83 | HR 102 | Temp 99.5°F | Resp 16

## 2022-11-15 DIAGNOSIS — N898 Other specified noninflammatory disorders of vagina: Secondary | ICD-10-CM

## 2022-11-15 MED ORDER — VALACYCLOVIR HCL 1 G PO TABS: 1000.0000 mg | ORAL_TABLET | Freq: Three times a day (TID) | ORAL | 2 refills | Status: AC

## 2022-11-15 NOTE — ED Triage Notes (Signed)
Pt reports rash in the groin area x 1 week.

## 2022-11-15 NOTE — ED Provider Notes (Signed)
[facility]    CSN: [id number] Arrival date & time: 11/15/22  1642      History   Chief Complaint Chief Complaint  Patient presents with   Rash    I'm Having An Out Break - Entered by patient   Appointment    1700    HPI Jaime Ramos is a 27 y.o. female.   Pt complains of   The history is provided by the patient. No language interpreter was used.  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 08/02/2021   Depression    doing good   Genital herpes    Obesity    Pre-diabetes    Precocious puberty    Trichomonal vaginitis    Wears glasses     Patient Active Problem List   Diagnosis Date Noted   Trichomoniasis 09/23/2018   Abnormal Pap smear of cervix 12/20/2016   MDD (major depressive disorder), recurrent episode, moderate (HCC) 01/25/2016   Chlamydia 04/27/2015   Nexplanon removal 02/09/2015   Preterm delivery 10/10/2013   Preterm labor 10/09/2013   Depression 10/02/2013   Vaginal bleeding before [redacted] weeks gestation 10/02/2013   Acanthosis 05/28/2012   Menorrhagia with regular cycle 07/31/2011   Goiter 07/31/2011   Prediabetes    Obesity 05/17/2011    Past Surgical History:  Procedure Laterality Date   DILATATION & CURETTAGE/HYSTEROSCOPY WITH MYOSURE N/A 08/10/2021   Procedure: DILATATION & CURETTAGE/HYSTEROSCOPY WITH MYOSURE;  Surgeon: Toy Baker, DO;  Location: [facility];  Service: Gynecology;  Laterality: N/A;   DILATION AND CURETTAGE OF UTERUS N/A 11/09/2014   Procedure: DILATATION AND CURETTAGE;  Surgeon: Allie Bossier, MD;   Location: [facility];  Service: Gynecology;  Laterality: N/A;    OB History     Gravida  3   Para  1   Term      Preterm  1   AB  2   Living  0      SAB  1   IAB      Ectopic  1   Multiple      Live Births  1            Home Medications    Prior to Admission medications   Medication Sig Start Date End Date Taking? Authorizing Provider  valACYclovir (VALTREX) 1000 MG tablet Take 1 tablet (1,000 mg total) by mouth 3 (three) times daily for 5 days. 11/15/22 11/20/22 Yes Elson Areas, PA-C  albuterol (VENTOLIN HFA) 108 (90 Base) MCG/ACT inhaler Inhale 2 puffs into the lungs every 6 (six) hours as needed for wheezing or shortness of breath (Cough). 09/29/22   Wallis Bamberg, PA-C  brompheniramine-pseudoephedrine-DM 30-2-10 MG/5ML syrup Take 5 mLs by mouth 4 (four) times daily as needed. 11/01/22   Leath-Warren, Sadie Haber, NP  fluticasone (FLONASE) 50 MCG/ACT nasal spray Place 2 sprays into both nostrils daily. 11/01/22   Leath-Warren, Sadie Haber, NP  levocetirizine (XYZAL) 5 MG tablet Take 1 tablet (5 mg total) by mouth  every evening. 02/13/22 05/14/22  Lynden Oxford Scales, PA-C  montelukast (SINGULAIR) 10 MG tablet Take 1 tablet (10 mg total) by mouth at bedtime. 02/13/22 05/14/22  Lynden Oxford Scales, PA-C  predniSONE (DELTASONE) 50 MG tablet Daily with breakfast for 5 days. 11/01/22   Leath-Warren, Alda Lea, NP  promethazine-dextromethorphan (PROMETHAZINE-DM) 6.25-15 MG/5ML syrup Take 5 mLs by mouth 3 (three) times daily as needed for cough. 09/29/22   Jaynee Eagles, PA-C  promethazine-dextromethorphan (PROMETHAZINE-DM) 6.25-15 MG/5ML syrup Take 5 mLs by mouth 4 (four) times daily as needed. 11/02/22   Volney American, PA-C    Family History Family History  Problem Relation Age of Onset   Diabetes Mother    Hypertension Mother    Obesity Mother    Fibroids Mother    Cancer Mother        thyroid   Obesity Father    Obesity Maternal Aunt    Fibroids  Maternal Aunt    Obesity Maternal Uncle    Obesity Paternal Aunt    Diabetes Maternal Grandmother    Obesity Maternal Grandmother    Hypertension Maternal Grandmother    Diabetes Maternal Grandfather    Obesity Maternal Grandfather    Stroke Maternal Grandfather    Heart disease Maternal Grandfather    Hypertension Maternal Grandfather    Obesity Paternal Grandfather    Heart disease Paternal Grandfather    Bipolar disorder Brother    Bipolar disorder Brother     Social History Social History   Tobacco Use   Smoking status: Never   Smokeless tobacco: Never  Vaping Use   Vaping Use: Never used  Substance Use Topics   Alcohol use: No   Drug use: Never     Allergies   Patient has no known allergies.   Review of Systems Review of Systems  Skin:  Positive for rash.  All other systems reviewed and are negative.    Physical Exam Triage Vital Signs ED Triage Vitals  Enc Vitals Group     BP 11/15/22 1654 123/83     Pulse Rate 11/15/22 1654 (!) 102     Resp 11/15/22 1654 16     Temp 11/15/22 1654 99.5 F (37.5 C)     Temp Source 11/15/22 1654 Oral     SpO2 11/15/22 1654 98 %     Weight --      Height --      Head Circumference --      Peak Flow --      Pain Score 11/15/22 1656 0     Pain Loc --      Pain Edu? --      Excl. in [place]? --    No data found.  Updated Vital Signs BP 123/83 (BP Location: Right Arm)   Pulse (!) 102   Temp 99.5 F (37.5 C) (Oral)   Resp 16   LMP 10/28/2022 (Exact Date)   SpO2 98%   Visual Acuity Right Eye Distance:   Left Eye Distance:   Bilateral Distance:    Right Eye Near:   Left Eye Near:    Bilateral Near:     Physical Exam Vitals and nursing note reviewed.  Constitutional:      Appearance: She is well-developed.  HENT:     Head: Normocephalic.  Cardiovascular:     Rate and Rhythm: Normal rate.  Pulmonary:     Effort: Pulmonary effort is normal.  Abdominal:     General: There is no distension.  Musculoskeletal:        General: Normal range of motion.     Cervical back: Normal range of motion.  Skin:    General: Skin is warm.  Neurological:     General: No focal deficit present.     Mental Status: She is alert and oriented to person, place, and time.      UC Treatments / Results  Labs (all labs ordered are listed, but only abnormal results are displayed) Labs Reviewed - No data to display  EKG   Radiology No results found.  Procedures Procedures (including critical care time)  Medications Ordered in UC Medications - No data to display  Initial Impression / Assessment and Plan / UC Course  I have reviewed the triage vital signs and the nursing notes.  Pertinent labs & imaging results that were available during my care of the patient were reviewed by me and considered in my medical decision making (see chart for details).     Herpes pending Final Clinical Impressions(s) / UC Diagnoses   Final diagnoses:  Vaginal lesion   Discharge Instructions   None    ED Prescriptions     Medication Sig Dispense Auth. Provider   valACYclovir (VALTREX) 1000 MG tablet Take 1 tablet (1,000 mg total) by mouth 3 (three) times daily for 5 days. 15 tablet Fransico Meadow, [place]      PDMP not reviewed this encounter. An After Visit Summary was printed and given to the patient.       Fransico Meadow, [place] 11/15/22 1736

## 2022-11-18 LAB — HSV CULTURE AND TYPING

## 2023-02-05 ENCOUNTER — Ambulatory Visit
Admission: EM | Admit: 2023-02-05 | Discharge: 2023-02-05 | Disposition: A | Discharge status: Home / Self Care | Payer: BC Managed Care – PPO | Attending: Nurse Practitioner | Admitting: Nurse Practitioner

## 2023-02-05 ENCOUNTER — Ambulatory Visit: Payer: Self-pay

## 2023-02-05 DIAGNOSIS — A6004 Herpesviral vulvovaginitis: Secondary | ICD-10-CM | POA: Diagnosis not present

## 2023-02-05 MED ORDER — VALACYCLOVIR HCL 1 G PO TABS: 1000.0000 mg | ORAL_TABLET | Freq: Every day | ORAL | 0 refills | Status: AC

## 2023-02-05 NOTE — ED Provider Notes (Signed)
[facility]    CSN: [id number] Arrival date & time: 02/05/23  1237      History   Chief Complaint No chief complaint on file.   HPI Jaime Ramos is a 27 y.o. female.   Patient presents today for a "flare".  Reports she noticed a vaginal sore last week on Friday that is circular and tender to touch.  Initially, she thought it was an ingrown hair.  She denies vaginal discharge, dysuria, urinary frequency urgency, abdominal pain, nausea/vomiting, and fevers.  Denies recent sex activity or concern for STI.  Reports whenever she has a HSV flare, she has sharp pain in her left leg that shoots down to her foot and this began last night.  Reports last flare was couple of months ago.  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 08/02/2021   Depression    doing good   Genital herpes    Obesity    Pre-diabetes    Precocious puberty    Trichomonal vaginitis    Wears glasses     Patient Active Problem List   Diagnosis Date Noted   Trichomoniasis 09/23/2018   Abnormal Pap smear of cervix 12/20/2016   MDD (major depressive disorder), recurrent episode, moderate 01/25/2016   Chlamydia 04/27/2015   Nexplanon removal 02/09/2015   Preterm delivery 10/10/2013   Preterm labor 10/09/2013   Depression 10/02/2013   Vaginal bleeding before [redacted] weeks gestation 10/02/2013   Acanthosis 05/28/2012   Menorrhagia with regular cycle 07/31/2011   Goiter 07/31/2011   Prediabetes    Obesity 05/17/2011    Past Surgical History:  Procedure Laterality Date   DILATATION & CURETTAGE/HYSTEROSCOPY WITH MYOSURE N/A 08/10/2021   Procedure: [place];  Surgeon: Armandina Stammer, DO;  Location: [facility];  Service: Gynecology;  Laterality: N/A;   DILATION AND CURETTAGE OF UTERUS N/A 11/09/2014   Procedure: DILATATION AND CURETTAGE;  Surgeon: Emily Filbert, MD;  Location: [facility];  Service: Gynecology;  Laterality: N/A;    OB History     Gravida  3   Para  1   Term      Preterm  1   AB  2   Living  0      SAB  1   IAB      Ectopic  1   Multiple      Live Births  1            Home Medications    Prior to Admission medications   Medication Sig Start Date End Date Taking? Authorizing Provider  valACYclovir (VALTREX) 1000 MG tablet Take 1 tablet (1,000 mg total) by mouth daily for 5 days. 02/05/23 02/10/23 Yes Eulogio Bear, NP  albuterol (VENTOLIN HFA) 108 (90 Base) MCG/ACT inhaler Inhale 2 puffs into the lungs every 6 (six) hours as needed for wheezing or shortness of breath (Cough). 09/29/22   Jaynee Eagles, PA-C  brompheniramine-pseudoephedrine-DM 30-2-10 MG/5ML syrup Take 5 mLs by mouth 4 (four) times daily as needed. 11/01/22   Leath-Warren, Alda Lea, NP  fluticasone (FLONASE) 50 MCG/ACT nasal spray Place 2 sprays into both nostrils  daily. 11/01/22   Leath-Warren, Alda Lea, NP  levocetirizine (XYZAL) 5 MG tablet Take 1 tablet (5 mg total) by mouth every evening. 02/13/22 05/14/22  Lynden Oxford Scales, PA-C  montelukast (SINGULAIR) 10 MG tablet Take 1 tablet (10 mg total) by mouth at bedtime. 02/13/22 05/14/22  Lynden Oxford Scales, PA-C  predniSONE (DELTASONE) 50 MG tablet Daily with breakfast for 5 days. 11/01/22   Leath-Warren, Alda Lea, NP  promethazine-dextromethorphan (PROMETHAZINE-DM) 6.25-15 MG/5ML syrup Take 5 mLs by mouth 3 (three) times daily as needed for cough. 09/29/22   Jaynee Eagles, PA-C  promethazine-dextromethorphan (PROMETHAZINE-DM) 6.25-15 MG/5ML syrup Take 5 mLs by mouth 4 (four) times daily as needed. 11/02/22   Volney American, PA-C    Family History Family History  Problem Relation Age of Onset   Diabetes  Mother    Hypertension Mother    Obesity Mother    Fibroids Mother    Cancer Mother        thyroid   Obesity Father    Obesity Maternal Aunt    Fibroids Maternal Aunt    Obesity Maternal Uncle    Obesity Paternal Aunt    Diabetes Maternal Grandmother    Obesity Maternal Grandmother    Hypertension Maternal Grandmother    Diabetes Maternal Grandfather    Obesity Maternal Grandfather    Stroke Maternal Grandfather    Heart disease Maternal Grandfather    Hypertension Maternal Grandfather    Obesity Paternal Grandfather    Heart disease Paternal Grandfather    Bipolar disorder Brother    Bipolar disorder Brother     Social History Social History   Tobacco Use   Smoking status: Never   Smokeless tobacco: Never  Vaping Use   Vaping Use: Never used  Substance Use Topics   Alcohol use: No   Drug use: Never     Allergies   Patient has no known allergies.   Review of Systems Review of Systems Per HPI  Physical Exam Triage Vital Signs ED Triage Vitals  Enc Vitals Group     BP 02/05/23 1306 124/75     Pulse Rate 02/05/23 1306 87     Resp 02/05/23 1306 18     Temp 02/05/23 1306 98.7 F (37.1 C)     Temp Source 02/05/23 1306 Oral     SpO2 02/05/23 1306 96 %     Weight --      Height --      Head Circumference --      Peak Flow --      Pain Score 02/05/23 1307 0     Pain Loc --      Pain Edu? --      Excl. in [place]? --    No data found.  Updated Vital Signs BP 124/75 (BP Location: Right Arm)   Pulse 87   Temp 98.7 F (37.1 C) (Oral)   Resp 18   LMP 12/26/2022 (Exact Date) Comment: pt states she already took a pregnancy test and it was neg  SpO2 96%   Visual Acuity Right Eye Distance:   Left Eye Distance:   Bilateral Distance:    Right Eye Near:   Left Eye Near:    Bilateral Near:     Physical Exam Vitals and nursing note reviewed.  Constitutional:      General: She is not in acute distress.    Appearance: Normal appearance. She is not  toxic-appearing.  Pulmonary:     Effort: Pulmonary effort is normal.  No respiratory distress.  Genitourinary:    Comments: Deferred using shared decision making Skin:    General: Skin is warm and dry.     Coloration: Skin is not jaundiced or pale.     Findings: No erythema.  Neurological:     Mental Status: She is alert and oriented to person, place, and time.     Motor: No weakness.     Gait: Gait normal.  Psychiatric:        Mood and Affect: Mood normal.        Behavior: Behavior is cooperative.      UC Treatments / Results  Labs (all labs ordered are listed, but only abnormal results are displayed) Labs Reviewed - No data to display  EKG   Radiology No results found.  Procedures Procedures (including critical care time)  Medications Ordered in UC Medications - No data to display  Initial Impression / Assessment and Plan / UC Course  I have reviewed the triage vital signs and the nursing notes.  Pertinent labs & imaging results that were available during my care of the patient were reviewed by me and considered in my medical decision making (see chart for details).   Patient is well-appearing, normotensive, afebrile, not tachycardic, not tachypneic, oxygenating well on room air.    1. Type 2 HSV infection of vulvovaginal region Will treat with Valtrex 1 mg daily for 5 days Discussed following up with primary care provider for suppressive therapy if recurrence/pain becomes more frequent  The patient was given the opportunity to ask questions.  All questions answered to their satisfaction.  The patient is in agreement to this plan.    Final Clinical Impressions(s) / UC Diagnoses   Final diagnoses:  Type 2 HSV infection of vulvovaginal region     Discharge Instructions      Take the Valtrex as prescribed for the outbreak. Seek care if symptoms persist or worsen despite treatment.   ED Prescriptions     Medication Sig Dispense Auth. Provider    valACYclovir (VALTREX) 1000 MG tablet Take 1 tablet (1,000 mg total) by mouth daily for 5 days. 5 tablet Eulogio Bear, NP      PDMP not reviewed this encounter.   Eulogio Bear, NP 02/05/23 [phone]

## 2023-02-05 NOTE — ED Triage Notes (Signed)
Pt reports she is having a blister outbreak on her vagina x 4 days. Pt states it is only 1 blister on her vagina which she thought was an ingrown hair.

## 2023-02-05 NOTE — Discharge Instructions (Signed)
Take the Valtrex as prescribed for the outbreak. Seek care if symptoms persist or worsen despite treatment.

## 2023-04-08 DIAGNOSIS — S8011XA Contusion of right lower leg, initial encounter: Secondary | ICD-10-CM | POA: Diagnosis not present

## 2023-04-08 DIAGNOSIS — S93411A Sprain of calcaneofibular ligament of right ankle, initial encounter: Secondary | ICD-10-CM | POA: Diagnosis not present

## 2023-04-08 DIAGNOSIS — M2011 Hallux valgus (acquired), right foot: Secondary | ICD-10-CM | POA: Diagnosis not present

## 2023-04-08 DIAGNOSIS — Z043 Encounter for examination and observation following other accident: Secondary | ICD-10-CM | POA: Diagnosis not present

## 2023-04-08 DIAGNOSIS — S8001XA Contusion of right knee, initial encounter: Secondary | ICD-10-CM | POA: Diagnosis not present

## 2023-04-08 DIAGNOSIS — S93601A Unspecified sprain of right foot, initial encounter: Secondary | ICD-10-CM | POA: Diagnosis not present

## 2023-04-08 DIAGNOSIS — S7001XA Contusion of right hip, initial encounter: Secondary | ICD-10-CM | POA: Diagnosis not present

## 2023-06-08 ENCOUNTER — Ambulatory Visit
Admission: EM | Admit: 2023-06-08 | Discharge: 2023-06-08 | Disposition: A | Discharge status: Home / Self Care | Payer: BC Managed Care – PPO | Attending: Nurse Practitioner | Admitting: Nurse Practitioner

## 2023-06-08 ENCOUNTER — Encounter: Payer: Self-pay | Admitting: Emergency Medicine

## 2023-06-08 DIAGNOSIS — R197 Diarrhea, unspecified: Secondary | ICD-10-CM

## 2023-06-08 DIAGNOSIS — R1011 Right upper quadrant pain: Secondary | ICD-10-CM | POA: Diagnosis not present

## 2023-06-08 DIAGNOSIS — R112 Nausea with vomiting, unspecified: Secondary | ICD-10-CM

## 2023-06-08 LAB — POCT URINE PREGNANCY: Preg Test, Ur: NEGATIVE

## 2023-06-08 LAB — POCT URINALYSIS DIP (MANUAL ENTRY)
Bilirubin, UA: NEGATIVE
Blood, UA: NEGATIVE
Glucose, UA: NEGATIVE mg/dL
Ketones, POC UA: NEGATIVE mg/dL
Leukocytes, UA: NEGATIVE
Nitrite, UA: NEGATIVE
Protein Ur, POC: NEGATIVE mg/dL
Spec Grav, UA: 1.025 (ref 1.010–1.025)
Urobilinogen, UA: 1 E.U./dL
pH, UA: 7 (ref 5.0–8.0)

## 2023-06-08 NOTE — ED Notes (Signed)
Patient is being discharged from the Urgent Care and sent to the Emergency Department via private Vehicle . Per NP, patient is in need of higher level of care due to Right lower Quad pain. Patient is aware and verbalizes understanding of plan of care.  Vitals:   06/08/23 1127  BP: 98/67  Pulse: 79  Resp: 18  Temp: 98.8 F (37.1 C)  SpO2: 99%

## 2023-06-08 NOTE — Discharge Instructions (Signed)
Please go to the emergency department for further evaluation.

## 2023-06-08 NOTE — ED Provider Notes (Signed)
[facility]    CSN: [id number] Arrival date & time: 06/08/23  1114      History   Chief Complaint No chief complaint on file.   HPI Jaime Ramos is a 27 y.o. female.   The history is provided by the patient.   The patient presents for complaints of fever, nausea, vomiting, diarrhea, and right upper quadrant abdominal pain.  Patient states symptoms started over the past 24 hours.  Tmax 100.  Patient states that she ha had approximately 3-4 episodes of vomiting and diarrhea when symptoms started, and has had 1 episode today.  Patient also states that she feels "weak and has a headache.  Patient reports that she normally has a bowel movement every day.  Patient states that she did take Tylenol around 10 AM because she was feeling "hot".  Patient's last menstrual cycle was on 05/01/2023.  She denies chest pain, constipation, gas, bloating, bloody stools, or urinary symptoms.   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 08/02/2021   Depression    doing good   Genital herpes    Obesity    Pre-diabetes    Precocious puberty    Trichomonal vaginitis    Wears glasses     Patient Active Problem List   Diagnosis Date Noted   Trichomoniasis 09/23/2018   Abnormal Pap smear of cervix 12/20/2016   MDD (major depressive disorder), recurrent episode, moderate (HCC) 01/25/2016   Chlamydia 04/27/2015   Nexplanon removal 02/09/2015   Preterm delivery 10/10/2013   Preterm labor 10/09/2013   Depression 10/02/2013   Vaginal bleeding before [redacted] weeks gestation 10/02/2013   Acanthosis 05/28/2012   Menorrhagia with regular cycle 07/31/2011   Goiter 07/31/2011   Prediabetes    Obesity 05/17/2011    Past Surgical History:  Procedure Laterality Date   DILATATION & CURETTAGE/HYSTEROSCOPY WITH MYOSURE N/A 08/10/2021    Procedure: DILATATION & CURETTAGE/HYSTEROSCOPY WITH MYOSURE;  Surgeon: Toy Baker, DO;  Location: [facility];  Service: Gynecology;  Laterality: N/A;   DILATION AND CURETTAGE OF UTERUS N/A 11/09/2014   Procedure: DILATATION AND CURETTAGE;  Surgeon: Allie Bossier, MD;  Location: [facility];  Service: Gynecology;  Laterality: N/A;    OB History     Gravida  3   Para  1   Term      Preterm  1   AB  2   Living  0      SAB  1   IAB      Ectopic  1   Multiple      Live Births  1            Home Medications    Prior to Admission medications   Medication Sig Start Date End Date Taking? Authorizing Provider  albuterol (VENTOLIN HFA) 108 (90 Base) MCG/ACT inhaler Inhale 2 puffs into the lungs every 6 (six) hours as needed for wheezing or shortness of breath (Cough). 09/29/22   Wallis Bamberg, PA-C  levocetirizine (XYZAL) 5 MG tablet Take 1 tablet (5 mg total) by mouth every evening. 02/13/22 05/14/22  Theadora Rama Scales, PA-C  montelukast (SINGULAIR) 10 MG tablet Take 1 tablet (10 mg total) by mouth at bedtime. 02/13/22 05/14/22  Theadora Rama Scales, PA-C  Family History Family History  Problem Relation Age of Onset   Diabetes Mother    Hypertension Mother    Obesity Mother    Fibroids Mother    Cancer Mother        thyroid   Obesity Father    Obesity Maternal Aunt    Fibroids Maternal Aunt    Obesity Maternal Uncle    Obesity Paternal Aunt    Diabetes Maternal Grandmother    Obesity Maternal Grandmother    Hypertension Maternal Grandmother    Diabetes Maternal Grandfather    Obesity Maternal Grandfather    Stroke Maternal Grandfather    Heart disease Maternal Grandfather    Hypertension Maternal Grandfather    Obesity Paternal Grandfather    Heart disease Paternal Grandfather    Bipolar disorder Brother    Bipolar disorder Brother     Social History Social History   Tobacco Use   Smoking status: Never   Smokeless tobacco: Never   Vaping Use   Vaping status: Never Used  Substance Use Topics   Alcohol use: No   Drug use: Never     Allergies   Patient has no known allergies.   Review of Systems Review of Systems Per HPI  Physical Exam Triage Vital Signs ED Triage Vitals  Encounter Vitals Group     BP 06/08/23 1127 98/67     Systolic BP Percentile --      Diastolic BP Percentile --      Pulse Rate 06/08/23 1127 79     Resp 06/08/23 1127 18     Temp 06/08/23 1127 98.8 F (37.1 C)     Temp Source 06/08/23 1127 Oral     SpO2 06/08/23 1127 99 %     Weight --      Height --      Head Circumference --      Peak Flow --      Pain Score 06/08/23 1129 5     Pain Loc --      Pain Education --      Exclude from Growth Chart --    No data found.  Updated Vital Signs BP 98/67 (BP Location: Right Arm)   Pulse 79   Temp 98.8 F (37.1 C) (Oral)   Resp 18   LMP 05/01/2023 (Approximate)   SpO2 99%   Visual Acuity Right Eye Distance:   Left Eye Distance:   Bilateral Distance:    Right Eye Near:   Left Eye Near:    Bilateral Near:     Physical Exam Vitals and nursing note reviewed.  Constitutional:      General: She is not in acute distress.    Appearance: Normal appearance.  HENT:     Head: Normocephalic.  Eyes:     Extraocular Movements: Extraocular movements intact.     Conjunctiva/sclera: Conjunctivae normal.     Pupils: Pupils are equal, round, and reactive to light.  Cardiovascular:     Rate and Rhythm: Normal rate and regular rhythm.     Pulses: Normal pulses.     Heart sounds: Normal heart sounds.  Pulmonary:     Effort: Pulmonary effort is normal. No respiratory distress.     Breath sounds: Normal breath sounds. No stridor. No wheezing, rhonchi or rales.  Abdominal:     General: Bowel sounds are normal.     Palpations: Abdomen is soft.     Tenderness: There is abdominal tenderness. There is no rebound.  Musculoskeletal:  Cervical back: Normal range of motion.   Lymphadenopathy:     Cervical: No cervical adenopathy.  Skin:    General: Skin is warm and dry.  Neurological:     General: No focal deficit present.     Mental Status: She is alert and oriented to person, place, and time.  Psychiatric:        Mood and Affect: Mood normal.        Behavior: Behavior normal.      UC Treatments / Results  Labs (all labs ordered are listed, but only abnormal results are displayed) Labs Reviewed  POCT URINALYSIS DIP (MANUAL ENTRY)  POCT URINE PREGNANCY    EKG   Radiology No results found.  Procedures Procedures (including critical care time)  Medications Ordered in UC Medications - No data to display  Initial Impression / Assessment and Plan / UC Course  I have reviewed the triage vital signs and the nursing notes.  Pertinent labs & imaging results that were available during my care of the patient were reviewed by me and considered in my medical decision making (see chart for details).  Urine pregnancy test and urinalysis are negative.  On exam, patient has "moderate" right upper quadrant tenderness.  No rebound tenderness or guarding noted on her exam.  Given the patient's current symptoms, and soft BP, there is concern for possible appendicitis.  Discussed with patient that given her current symptoms, it is recommended that she follow-up in the emergency department for further evaluation.  Patient's vital signs are stable, and she is able to travel via private vehicle.  Patient is in agreement with this recommendation.  Patient stable for discharge, discharged to the emergency department.  Final Clinical Impressions(s) / UC Diagnoses   Final diagnoses:  None   Discharge Instructions   None    ED Prescriptions   None    PDMP not reviewed this encounter.   Abran Cantor, NP 06/08/23 1526

## 2023-06-08 NOTE — ED Triage Notes (Signed)
Vomiting since last night and diarrhea x 2 days.  Headache this morning.  States right lower side of ABD hurts.

## 2023-09-03 DIAGNOSIS — F419 Anxiety disorder, unspecified: Secondary | ICD-10-CM | POA: Diagnosis not present

## 2023-09-03 DIAGNOSIS — Z8639 Personal history of other endocrine, nutritional and metabolic disease: Secondary | ICD-10-CM | POA: Diagnosis not present

## 2023-09-03 DIAGNOSIS — R0683 Snoring: Secondary | ICD-10-CM | POA: Diagnosis not present

## 2023-09-03 DIAGNOSIS — Z6841 Body Mass Index (BMI) 40.0 and over, adult: Secondary | ICD-10-CM | POA: Diagnosis not present

## 2023-09-03 DIAGNOSIS — Z1331 Encounter for screening for depression: Secondary | ICD-10-CM | POA: Diagnosis not present

## 2023-09-03 DIAGNOSIS — R7303 Prediabetes: Secondary | ICD-10-CM | POA: Diagnosis not present

## 2023-09-05 DIAGNOSIS — J4521 Mild intermittent asthma with (acute) exacerbation: Secondary | ICD-10-CM | POA: Diagnosis not present

## 2023-09-05 DIAGNOSIS — J189 Pneumonia, unspecified organism: Secondary | ICD-10-CM | POA: Diagnosis not present

## 2023-09-16 DIAGNOSIS — R0683 Snoring: Secondary | ICD-10-CM | POA: Diagnosis not present

## 2023-09-17 DIAGNOSIS — R7303 Prediabetes: Secondary | ICD-10-CM | POA: Diagnosis not present

## 2023-09-17 DIAGNOSIS — R0683 Snoring: Secondary | ICD-10-CM | POA: Diagnosis not present

## 2023-09-17 DIAGNOSIS — D500 Iron deficiency anemia secondary to blood loss (chronic): Secondary | ICD-10-CM | POA: Diagnosis not present

## 2023-09-17 DIAGNOSIS — E559 Vitamin D deficiency, unspecified: Secondary | ICD-10-CM | POA: Diagnosis not present

## 2023-09-17 DIAGNOSIS — Z9189 Other specified personal risk factors, not elsewhere classified: Secondary | ICD-10-CM | POA: Diagnosis not present

## 2023-10-16 DIAGNOSIS — J351 Hypertrophy of tonsils: Secondary | ICD-10-CM | POA: Diagnosis not present

## 2023-10-16 DIAGNOSIS — G4733 Obstructive sleep apnea (adult) (pediatric): Secondary | ICD-10-CM | POA: Diagnosis not present

## 2023-11-01 DIAGNOSIS — J4521 Mild intermittent asthma with (acute) exacerbation: Secondary | ICD-10-CM | POA: Diagnosis not present

## 2023-11-01 DIAGNOSIS — R051 Acute cough: Secondary | ICD-10-CM | POA: Diagnosis not present

## 2023-11-01 DIAGNOSIS — J101 Influenza due to other identified influenza virus with other respiratory manifestations: Secondary | ICD-10-CM | POA: Diagnosis not present

## 2023-11-12 ENCOUNTER — Encounter (INDEPENDENT_AMBULATORY_CARE_PROVIDER_SITE_OTHER): Payer: Self-pay | Admitting: Otolaryngology

## 2023-11-20 ENCOUNTER — Encounter (INDEPENDENT_AMBULATORY_CARE_PROVIDER_SITE_OTHER): Payer: Self-pay | Admitting: Otolaryngology

## 2023-11-28 DIAGNOSIS — A6004 Herpesviral vulvovaginitis: Secondary | ICD-10-CM | POA: Diagnosis not present

## 2024-05-03 DIAGNOSIS — M5432 Sciatica, left side: Secondary | ICD-10-CM | POA: Diagnosis not present

## 2024-05-22 ENCOUNTER — Encounter: Payer: Self-pay | Admitting: Advanced Practice Midwife

## 2024-05-25 DIAGNOSIS — M5432 Sciatica, left side: Secondary | ICD-10-CM | POA: Diagnosis not present

## 2024-05-25 DIAGNOSIS — E559 Vitamin D deficiency, unspecified: Secondary | ICD-10-CM | POA: Diagnosis not present

## 2024-05-25 DIAGNOSIS — D500 Iron deficiency anemia secondary to blood loss (chronic): Secondary | ICD-10-CM | POA: Diagnosis not present

## 2024-05-25 DIAGNOSIS — R7303 Prediabetes: Secondary | ICD-10-CM | POA: Diagnosis not present

## 2024-05-25 DIAGNOSIS — Z13220 Encounter for screening for lipoid disorders: Secondary | ICD-10-CM | POA: Diagnosis not present

## 2024-06-03 DIAGNOSIS — R635 Abnormal weight gain: Secondary | ICD-10-CM | POA: Diagnosis not present

## 2024-07-23 DIAGNOSIS — R87619 Unspecified abnormal cytological findings in specimens from cervix uteri: Secondary | ICD-10-CM | POA: Diagnosis not present

## 2024-07-23 DIAGNOSIS — Z113 Encounter for screening for infections with a predominantly sexual mode of transmission: Secondary | ICD-10-CM | POA: Diagnosis not present

## 2024-07-23 DIAGNOSIS — N939 Abnormal uterine and vaginal bleeding, unspecified: Secondary | ICD-10-CM | POA: Diagnosis not present

## 2024-07-23 DIAGNOSIS — Z124 Encounter for screening for malignant neoplasm of cervix: Secondary | ICD-10-CM | POA: Diagnosis not present

## 2024-07-23 DIAGNOSIS — N920 Excessive and frequent menstruation with regular cycle: Secondary | ICD-10-CM | POA: Diagnosis not present

## 2024-07-23 DIAGNOSIS — Z0000 Encounter for general adult medical examination without abnormal findings: Secondary | ICD-10-CM | POA: Diagnosis not present

## 2024-07-28 DIAGNOSIS — F331 Major depressive disorder, recurrent, moderate: Secondary | ICD-10-CM | POA: Diagnosis not present

## 2024-07-28 DIAGNOSIS — E559 Vitamin D deficiency, unspecified: Secondary | ICD-10-CM | POA: Diagnosis not present

## 2024-07-28 DIAGNOSIS — D500 Iron deficiency anemia secondary to blood loss (chronic): Secondary | ICD-10-CM | POA: Diagnosis not present

## 2024-07-28 DIAGNOSIS — R7303 Prediabetes: Secondary | ICD-10-CM | POA: Diagnosis not present

## 2024-08-06 DIAGNOSIS — Z6841 Body Mass Index (BMI) 40.0 and over, adult: Secondary | ICD-10-CM | POA: Diagnosis not present

## 2024-08-06 DIAGNOSIS — Z713 Dietary counseling and surveillance: Secondary | ICD-10-CM | POA: Diagnosis not present

## 2024-08-06 DIAGNOSIS — E66813 Obesity, class 3: Secondary | ICD-10-CM | POA: Diagnosis not present

## 2024-08-23 DIAGNOSIS — J069 Acute upper respiratory infection, unspecified: Secondary | ICD-10-CM | POA: Diagnosis not present

## 2024-08-23 DIAGNOSIS — R0602 Shortness of breath: Secondary | ICD-10-CM | POA: Diagnosis not present

## 2024-09-09 DIAGNOSIS — N939 Abnormal uterine and vaginal bleeding, unspecified: Secondary | ICD-10-CM | POA: Diagnosis not present

## 2024-09-09 DIAGNOSIS — R108A3 Suprapubic tenderness: Secondary | ICD-10-CM | POA: Diagnosis not present

## 2024-09-09 DIAGNOSIS — R10813 Right lower quadrant abdominal tenderness: Secondary | ICD-10-CM | POA: Diagnosis not present

## 2024-09-09 DIAGNOSIS — R102 Pelvic and perineal pain unspecified side: Secondary | ICD-10-CM | POA: Diagnosis not present

## 2024-09-13 DIAGNOSIS — Z8742 Personal history of other diseases of the female genital tract: Secondary | ICD-10-CM | POA: Diagnosis not present

## 2024-09-13 DIAGNOSIS — R1031 Right lower quadrant pain: Secondary | ICD-10-CM | POA: Diagnosis not present

## 2024-09-13 DIAGNOSIS — Z3202 Encounter for pregnancy test, result negative: Secondary | ICD-10-CM | POA: Diagnosis not present

## 2024-09-13 DIAGNOSIS — N939 Abnormal uterine and vaginal bleeding, unspecified: Secondary | ICD-10-CM | POA: Diagnosis not present

## 2024-09-13 DIAGNOSIS — N838 Other noninflammatory disorders of ovary, fallopian tube and broad ligament: Secondary | ICD-10-CM | POA: Diagnosis not present

## 2024-09-23 DIAGNOSIS — Z6841 Body Mass Index (BMI) 40.0 and over, adult: Secondary | ICD-10-CM | POA: Diagnosis not present
